# Patient Record
Sex: Female | Born: 1965 | Race: White | Hispanic: No | Smoking: Never smoker
Health system: Southern US, Community
[De-identification: ages and names within clinical notes are randomized; demographics above are authoritative.]

## PROBLEM LIST (undated history)

## (undated) DIAGNOSIS — R51 Headache: Secondary | ICD-10-CM

## (undated) DIAGNOSIS — E7800 Pure hypercholesterolemia, unspecified: Secondary | ICD-10-CM

## (undated) DIAGNOSIS — K219 Gastro-esophageal reflux disease without esophagitis: Secondary | ICD-10-CM

## (undated) DIAGNOSIS — E663 Overweight: Secondary | ICD-10-CM

## (undated) DIAGNOSIS — M5481 Occipital neuralgia: Secondary | ICD-10-CM

## (undated) DIAGNOSIS — F419 Anxiety disorder, unspecified: Secondary | ICD-10-CM

## (undated) DIAGNOSIS — K449 Diaphragmatic hernia without obstruction or gangrene: Secondary | ICD-10-CM

## (undated) DIAGNOSIS — T8859XA Other complications of anesthesia, initial encounter: Secondary | ICD-10-CM

## (undated) DIAGNOSIS — C801 Malignant (primary) neoplasm, unspecified: Secondary | ICD-10-CM

## (undated) DIAGNOSIS — R42 Dizziness and giddiness: Secondary | ICD-10-CM

## (undated) DIAGNOSIS — T4145XA Adverse effect of unspecified anesthetic, initial encounter: Secondary | ICD-10-CM

## (undated) DIAGNOSIS — M792 Neuralgia and neuritis, unspecified: Secondary | ICD-10-CM

## (undated) DIAGNOSIS — N200 Calculus of kidney: Secondary | ICD-10-CM

## (undated) HISTORY — DX: Anxiety disorder, unspecified: F41.9

## (undated) HISTORY — DX: Malignant (primary) neoplasm, unspecified: C80.1

## (undated) HISTORY — DX: Dizziness and giddiness: R42

## (undated) HISTORY — DX: Pure hypercholesterolemia, unspecified: E78.00

## (undated) HISTORY — DX: Diaphragmatic hernia without obstruction or gangrene: K44.9

## (undated) HISTORY — DX: Gastro-esophageal reflux disease without esophagitis: K21.9

## (undated) HISTORY — DX: Occipital neuralgia: M54.81

## (undated) HISTORY — DX: Overweight: E66.3

---

## 1995-08-01 HISTORY — PX: TUBAL LIGATION: SHX77

## 1998-09-16 ENCOUNTER — Ambulatory Visit (HOSPITAL_COMMUNITY): Admission: RE | Admit: 1998-09-16 | Discharge: 1998-09-16 | Payer: Self-pay | Admitting: *Deleted

## 2001-11-12 ENCOUNTER — Encounter: Payer: Self-pay | Admitting: *Deleted

## 2001-11-12 ENCOUNTER — Inpatient Hospital Stay (HOSPITAL_COMMUNITY): Admission: AD | Admit: 2001-11-12 | Discharge: 2001-11-12 | Payer: Self-pay | Admitting: *Deleted

## 2001-11-15 ENCOUNTER — Observation Stay (HOSPITAL_COMMUNITY): Admission: EM | Admit: 2001-11-15 | Discharge: 2001-11-15 | Payer: Self-pay | Admitting: Emergency Medicine

## 2003-11-20 ENCOUNTER — Ambulatory Visit (HOSPITAL_COMMUNITY): Admission: RE | Admit: 2003-11-20 | Discharge: 2003-11-20 | Payer: Self-pay | Admitting: Obstetrics & Gynecology

## 2004-09-08 ENCOUNTER — Ambulatory Visit: Payer: Self-pay | Admitting: Pulmonary Disease

## 2004-12-07 ENCOUNTER — Ambulatory Visit: Payer: Self-pay | Admitting: Pulmonary Disease

## 2005-03-21 ENCOUNTER — Ambulatory Visit: Payer: Self-pay | Admitting: Pulmonary Disease

## 2005-09-01 ENCOUNTER — Other Ambulatory Visit: Admission: RE | Admit: 2005-09-01 | Discharge: 2005-09-01 | Payer: Self-pay | Admitting: Obstetrics and Gynecology

## 2006-06-26 ENCOUNTER — Ambulatory Visit: Payer: Self-pay | Admitting: Pulmonary Disease

## 2006-09-10 ENCOUNTER — Ambulatory Visit: Payer: Self-pay | Admitting: Pulmonary Disease

## 2006-09-10 LAB — CONVERTED CEMR LAB
ALT: 19 units/L (ref 0–40)
AST: 18 units/L (ref 0–37)
Albumin: 3.5 g/dL (ref 3.5–5.2)
Alkaline Phosphatase: 67 units/L (ref 39–117)
BUN: 11 mg/dL (ref 6–23)
Bilirubin, Direct: 0.1 mg/dL (ref 0.0–0.3)
CO2: 28 meq/L (ref 19–32)
Calcium: 9.1 mg/dL (ref 8.4–10.5)
Chloride: 103 meq/L (ref 96–112)
Cholesterol: 174 mg/dL (ref 0–200)
Creatinine, Ser: 0.7 mg/dL (ref 0.4–1.2)
GFR calc Af Amer: 120 mL/min
GFR calc non Af Amer: 100 mL/min
Glucose, Bld: 86 mg/dL (ref 70–99)
HDL: 34.9 mg/dL — ABNORMAL LOW (ref >39.0)
LDL Cholesterol: 111 mg/dL — ABNORMAL HIGH (ref 0–99)
Potassium: 4.4 meq/L (ref 3.5–5.1)
Sodium: 140 meq/L (ref 135–145)
Total Bilirubin: 0.6 mg/dL (ref 0.3–1.2)
Total CHOL/HDL Ratio: 5
Total Protein: 6.5 g/dL (ref 6.0–8.3)
Triglycerides: 139 mg/dL (ref 0–149)
VLDL: 28 mg/dL (ref 0–40)

## 2007-03-12 ENCOUNTER — Ambulatory Visit: Payer: Self-pay | Admitting: Pulmonary Disease

## 2007-03-12 LAB — CONVERTED CEMR LAB
ALT: 29 units/L (ref 0–35)
AST: 23 units/L (ref 0–37)
Albumin: 3.6 g/dL (ref 3.5–5.2)
Alkaline Phosphatase: 68 units/L (ref 39–117)
BUN: 8 mg/dL (ref 6–23)
Basophils Absolute: 0 10*3/uL (ref 0.0–0.1)
Basophils Relative: 0.1 % (ref 0.0–1.0)
Bilirubin, Direct: 0.1 mg/dL (ref 0.0–0.3)
CO2: 31 meq/L (ref 19–32)
Calcium: 9.2 mg/dL (ref 8.4–10.5)
Chloride: 103 meq/L (ref 96–112)
Creatinine, Ser: 1 mg/dL (ref 0.4–1.2)
Eosinophils Absolute: 0.1 10*3/uL (ref 0.0–0.6)
Eosinophils Relative: 0.7 % (ref 0.0–5.0)
GFR calc Af Amer: 79 mL/min
GFR calc non Af Amer: 66 mL/min
Glucose, Bld: 117 mg/dL — ABNORMAL HIGH (ref 70–99)
HCT: 41.5 % (ref 36.0–46.0)
Hemoglobin: 14.2 g/dL (ref 12.0–15.0)
Lymphocytes Relative: 25.5 % (ref 12.0–46.0)
MCHC: 34.2 g/dL (ref 30.0–36.0)
MCV: 90.1 fL (ref 78.0–100.0)
Monocytes Absolute: 0.6 10*3/uL (ref 0.2–0.7)
Monocytes Relative: 5 % (ref 3.0–11.0)
Neutro Abs: 7.6 10*3/uL (ref 1.4–7.7)
Neutrophils Relative %: 68.7 % (ref 43.0–77.0)
Platelets: 250 10*3/uL (ref 150–400)
Potassium: 3.6 meq/L (ref 3.5–5.1)
RBC: 4.61 M/uL (ref 3.87–5.11)
RDW: 12 % (ref 11.5–14.6)
Sodium: 141 meq/L (ref 135–145)
TSH: 1.04 microintl units/mL (ref 0.35–5.50)
Total Bilirubin: 0.6 mg/dL (ref 0.3–1.2)
Total Protein: 6.8 g/dL (ref 6.0–8.3)
WBC: 11.2 10*3/uL — ABNORMAL HIGH (ref 4.5–10.5)

## 2007-03-14 ENCOUNTER — Ambulatory Visit (HOSPITAL_COMMUNITY): Admission: RE | Admit: 2007-03-14 | Discharge: 2007-03-14 | Payer: Self-pay | Admitting: Pulmonary Disease

## 2007-04-03 ENCOUNTER — Ambulatory Visit: Payer: Self-pay | Admitting: Pulmonary Disease

## 2007-04-03 DIAGNOSIS — I10 Essential (primary) hypertension: Secondary | ICD-10-CM | POA: Insufficient documentation

## 2007-04-03 DIAGNOSIS — K219 Gastro-esophageal reflux disease without esophagitis: Secondary | ICD-10-CM | POA: Insufficient documentation

## 2007-04-03 DIAGNOSIS — E7800 Pure hypercholesterolemia, unspecified: Secondary | ICD-10-CM | POA: Insufficient documentation

## 2007-04-03 DIAGNOSIS — I872 Venous insufficiency (chronic) (peripheral): Secondary | ICD-10-CM | POA: Insufficient documentation

## 2007-04-03 DIAGNOSIS — K449 Diaphragmatic hernia without obstruction or gangrene: Secondary | ICD-10-CM | POA: Insufficient documentation

## 2007-04-03 DIAGNOSIS — E663 Overweight: Secondary | ICD-10-CM | POA: Insufficient documentation

## 2007-11-29 ENCOUNTER — Ambulatory Visit: Payer: Self-pay | Admitting: Pulmonary Disease

## 2007-12-14 DIAGNOSIS — F411 Generalized anxiety disorder: Secondary | ICD-10-CM | POA: Insufficient documentation

## 2009-01-06 ENCOUNTER — Ambulatory Visit: Payer: Self-pay | Admitting: Internal Medicine

## 2009-01-07 LAB — CONVERTED CEMR LAB
ALT: 15 units/L (ref 0–35)
AST: 15 units/L (ref 0–37)
Albumin: 3.5 g/dL (ref 3.5–5.2)
Alkaline Phosphatase: 52 units/L (ref 39–117)
BUN: 15 mg/dL (ref 6–23)
Basophils Absolute: 0.1 10*3/uL (ref 0.0–0.1)
Basophils Relative: 1 % (ref 0.0–3.0)
Bilirubin, Direct: 0.1 mg/dL (ref 0.0–0.3)
CO2: 29 meq/L (ref 19–32)
Calcium: 8.8 mg/dL (ref 8.4–10.5)
Chloride: 112 meq/L (ref 96–112)
Cholesterol: 144 mg/dL (ref 0–200)
Creatinine, Ser: 0.7 mg/dL (ref 0.4–1.2)
Eosinophils Absolute: 0.1 10*3/uL (ref 0.0–0.7)
Eosinophils Relative: 1.3 % (ref 0.0–5.0)
GFR calc non Af Amer: 98.05 mL/min (ref >60)
Glucose, Bld: 90 mg/dL (ref 70–99)
HCT: 39 % (ref 36.0–46.0)
HDL: 46.2 mg/dL (ref >39.00)
Hemoglobin: 13.2 g/dL (ref 12.0–15.0)
LDL Cholesterol: 88 mg/dL (ref 0–99)
Lymphocytes Relative: 31.3 % (ref 12.0–46.0)
Lymphs Abs: 2.1 10*3/uL (ref 0.7–4.0)
MCHC: 33.8 g/dL (ref 30.0–36.0)
MCV: 91.8 fL (ref 78.0–100.0)
Monocytes Absolute: 0.4 10*3/uL (ref 0.1–1.0)
Monocytes Relative: 5.2 % (ref 3.0–12.0)
Neutro Abs: 4.1 10*3/uL (ref 1.4–7.7)
Neutrophils Relative %: 61.2 % (ref 43.0–77.0)
Platelets: 196 10*3/uL (ref 150.0–400.0)
Potassium: 4 meq/L (ref 3.5–5.1)
RBC: 4.25 M/uL (ref 3.87–5.11)
RDW: 11.8 % (ref 11.5–14.6)
Sodium: 142 meq/L (ref 135–145)
TSH: 0.83 microintl units/mL (ref 0.35–5.50)
Total Bilirubin: 0.5 mg/dL (ref 0.3–1.2)
Total CHOL/HDL Ratio: 3
Total Protein: 6.6 g/dL (ref 6.0–8.3)
Triglycerides: 49 mg/dL (ref 0.0–149.0)
VLDL: 9.8 mg/dL (ref 0.0–40.0)
WBC: 6.8 10*3/uL (ref 4.5–10.5)

## 2009-04-29 ENCOUNTER — Telehealth (INDEPENDENT_AMBULATORY_CARE_PROVIDER_SITE_OTHER): Payer: Self-pay | Admitting: *Deleted

## 2009-08-05 ENCOUNTER — Ambulatory Visit: Payer: Self-pay | Admitting: Pulmonary Disease

## 2009-08-08 LAB — CONVERTED CEMR LAB
ALT: 16 units/L (ref 0–35)
AST: 16 units/L (ref 0–37)
Albumin: 3.7 g/dL (ref 3.5–5.2)
Alkaline Phosphatase: 59 units/L (ref 39–117)
BUN: 9 mg/dL (ref 6–23)
Basophils Absolute: 0.1 10*3/uL (ref 0.0–0.1)
Basophils Relative: 0.8 % (ref 0.0–3.0)
Bilirubin Urine: NEGATIVE
Bilirubin, Direct: 0.1 mg/dL (ref 0.0–0.3)
CO2: 28 meq/L (ref 19–32)
Calcium: 9.1 mg/dL (ref 8.4–10.5)
Chloride: 105 meq/L (ref 96–112)
Cholesterol: 167 mg/dL (ref 0–200)
Creatinine, Ser: 0.8 mg/dL (ref 0.4–1.2)
Eosinophils Absolute: 0.1 10*3/uL (ref 0.0–0.7)
Eosinophils Relative: 0.9 % (ref 0.0–5.0)
GFR calc non Af Amer: 83.81 mL/min (ref >60)
Glucose, Bld: 89 mg/dL (ref 70–99)
HCT: 42.1 % (ref 36.0–46.0)
HDL: 46.1 mg/dL (ref >39.00)
Hemoglobin, Urine: NEGATIVE
Hemoglobin: 13.9 g/dL (ref 12.0–15.0)
Ketones, ur: NEGATIVE mg/dL
LDL Cholesterol: 106 mg/dL — ABNORMAL HIGH (ref 0–99)
Lymphocytes Relative: 26.4 % (ref 12.0–46.0)
Lymphs Abs: 2.5 10*3/uL (ref 0.7–4.0)
MCHC: 33 g/dL (ref 30.0–36.0)
MCV: 92.5 fL (ref 78.0–100.0)
Monocytes Absolute: 0.4 10*3/uL (ref 0.1–1.0)
Monocytes Relative: 4.6 % (ref 3.0–12.0)
Neutro Abs: 6.2 10*3/uL (ref 1.4–7.7)
Neutrophils Relative %: 67.3 % (ref 43.0–77.0)
Nitrite: NEGATIVE
Platelets: 173 10*3/uL (ref 150.0–400.0)
Potassium: 4.1 meq/L (ref 3.5–5.1)
RBC: 4.55 M/uL (ref 3.87–5.11)
RDW: 12.2 % (ref 11.5–14.6)
Sodium: 139 meq/L (ref 135–145)
Specific Gravity, Urine: 1.01 (ref 1.000–1.030)
TSH: 1.4 microintl units/mL (ref 0.35–5.50)
Total Bilirubin: 0.7 mg/dL (ref 0.3–1.2)
Total CHOL/HDL Ratio: 4
Total Protein, Urine: NEGATIVE mg/dL
Total Protein: 7.4 g/dL (ref 6.0–8.3)
Triglycerides: 74 mg/dL (ref 0.0–149.0)
Urine Glucose: NEGATIVE mg/dL
Urobilinogen, UA: 0.2 (ref 0.0–1.0)
VLDL: 14.8 mg/dL (ref 0.0–40.0)
WBC: 9.3 10*3/uL (ref 4.5–10.5)
pH: 6.5 (ref 5.0–8.0)

## 2010-02-08 ENCOUNTER — Ambulatory Visit: Payer: Self-pay | Admitting: Pulmonary Disease

## 2010-04-28 ENCOUNTER — Ambulatory Visit: Payer: Self-pay | Admitting: Pulmonary Disease

## 2010-05-20 ENCOUNTER — Telehealth: Payer: Self-pay | Admitting: Pulmonary Disease

## 2010-06-15 ENCOUNTER — Encounter
Admission: RE | Admit: 2010-06-15 | Discharge: 2010-06-15 | Payer: Self-pay | Source: Home / Self Care | Attending: Pulmonary Disease | Admitting: Pulmonary Disease

## 2010-06-15 ENCOUNTER — Encounter: Payer: Self-pay | Admitting: Pulmonary Disease

## 2010-06-16 ENCOUNTER — Telehealth: Payer: Self-pay | Admitting: Pulmonary Disease

## 2010-07-08 ENCOUNTER — Encounter: Payer: Self-pay | Admitting: Pulmonary Disease

## 2010-07-14 ENCOUNTER — Encounter: Payer: Self-pay | Admitting: Pulmonary Disease

## 2010-08-10 ENCOUNTER — Telehealth: Payer: Self-pay | Admitting: Pulmonary Disease

## 2010-08-26 ENCOUNTER — Ambulatory Visit
Admission: RE | Admit: 2010-08-26 | Discharge: 2010-08-26 | Payer: Self-pay | Source: Home / Self Care | Attending: Pulmonary Disease | Admitting: Pulmonary Disease

## 2010-08-26 ENCOUNTER — Other Ambulatory Visit: Payer: Self-pay | Admitting: Pulmonary Disease

## 2010-08-26 LAB — URINALYSIS, ROUTINE W REFLEX MICROSCOPIC
Bilirubin Urine: NEGATIVE
Ketones, ur: NEGATIVE
Leukocytes, UA: NEGATIVE
Nitrite: NEGATIVE
Specific Gravity, Urine: 1.02 (ref 1.000–1.030)
Total Protein, Urine: NEGATIVE
Urine Glucose: NEGATIVE
Urobilinogen, UA: 0.2 (ref 0.0–1.0)
pH: 7.5 (ref 5.0–8.0)

## 2010-08-26 LAB — HEPATIC FUNCTION PANEL
ALT: 15 U/L (ref 0–35)
AST: 14 U/L (ref 0–37)
Albumin: 3.4 g/dL — ABNORMAL LOW (ref 3.5–5.2)
Alkaline Phosphatase: 57 U/L (ref 39–117)
Bilirubin, Direct: 0 mg/dL (ref 0.0–0.3)
Total Bilirubin: 0.5 mg/dL (ref 0.3–1.2)
Total Protein: 6.3 g/dL (ref 6.0–8.3)

## 2010-08-26 LAB — TSH: TSH: 1.09 u[IU]/mL (ref 0.35–5.50)

## 2010-08-26 LAB — CBC WITH DIFFERENTIAL/PLATELET
Basophils Absolute: 0.1 10*3/uL (ref 0.0–0.1)
Basophils Relative: 1.6 % (ref 0.0–3.0)
Eosinophils Absolute: 0.1 10*3/uL (ref 0.0–0.7)
Eosinophils Relative: 1.1 % (ref 0.0–5.0)
HCT: 37.2 % (ref 36.0–46.0)
Hemoglobin: 12.5 g/dL (ref 12.0–15.0)
Lymphocytes Relative: 29.1 % (ref 12.0–46.0)
Lymphs Abs: 2.2 10*3/uL (ref 0.7–4.0)
MCHC: 33.6 g/dL (ref 30.0–36.0)
MCV: 90.3 fl (ref 78.0–100.0)
Monocytes Absolute: 0.5 10*3/uL (ref 0.1–1.0)
Monocytes Relative: 6.1 % (ref 3.0–12.0)
Neutro Abs: 4.7 10*3/uL (ref 1.4–7.7)
Neutrophils Relative %: 62.1 % (ref 43.0–77.0)
Platelets: 182 10*3/uL (ref 150.0–400.0)
RBC: 4.12 Mil/uL (ref 3.87–5.11)
RDW: 13.9 % (ref 11.5–14.6)
WBC: 7.6 10*3/uL (ref 4.5–10.5)

## 2010-08-26 LAB — BASIC METABOLIC PANEL
BUN: 16 mg/dL (ref 6–23)
CO2: 29 mEq/L (ref 19–32)
Calcium: 8.9 mg/dL (ref 8.4–10.5)
Chloride: 106 mEq/L (ref 96–112)
Creatinine, Ser: 0.7 mg/dL (ref 0.4–1.2)
GFR: 94.16 mL/min (ref >60.00)
Glucose, Bld: 100 mg/dL — ABNORMAL HIGH (ref 70–99)
Potassium: 4.6 mEq/L (ref 3.5–5.1)
Sodium: 139 mEq/L (ref 135–145)

## 2010-08-26 LAB — LIPID PANEL
Cholesterol: 173 mg/dL (ref 0–200)
HDL: 46 mg/dL (ref >39.00)
LDL Cholesterol: 108 mg/dL — ABNORMAL HIGH (ref 0–99)
Total CHOL/HDL Ratio: 4
Triglycerides: 96 mg/dL (ref 0.0–149.0)
VLDL: 19.2 mg/dL (ref 0.0–40.0)

## 2010-08-28 LAB — CONVERTED CEMR LAB
ALT: 15 units/L (ref 0–35)
AST: 15 units/L (ref 0–37)
Albumin: 3.8 g/dL (ref 3.5–5.2)
Alkaline Phosphatase: 56 units/L (ref 39–117)
BUN: 11 mg/dL (ref 6–23)
Basophils Absolute: 0.1 10*3/uL (ref 0.0–0.1)
Basophils Relative: 0.8 % (ref 0.0–1.0)
Bilirubin Urine: NEGATIVE
Bilirubin, Direct: 0.1 mg/dL (ref 0.0–0.3)
CO2: 29 meq/L (ref 19–32)
Calcium: 9 mg/dL (ref 8.4–10.5)
Chloride: 104 meq/L (ref 96–112)
Cholesterol: 156 mg/dL (ref 0–200)
Creatinine, Ser: 0.7 mg/dL (ref 0.4–1.2)
Crystals: NEGATIVE
Eosinophils Absolute: 0.1 10*3/uL (ref 0.0–0.7)
Eosinophils Relative: 0.8 % (ref 0.0–5.0)
GFR calc Af Amer: 120 mL/min
GFR calc non Af Amer: 99 mL/min
Glucose, Bld: 96 mg/dL (ref 70–99)
HCT: 42.4 % (ref 36.0–46.0)
HDL: 37.2 mg/dL — ABNORMAL LOW (ref >39.0)
Hemoglobin: 14.4 g/dL (ref 12.0–15.0)
Ketones, ur: NEGATIVE mg/dL
LDL Cholesterol: 107 mg/dL — ABNORMAL HIGH (ref 0–99)
Lymphocytes Relative: 27.3 % (ref 12.0–46.0)
MCHC: 33.9 g/dL (ref 30.0–36.0)
MCV: 91.2 fL (ref 78.0–100.0)
Monocytes Absolute: 0.4 10*3/uL (ref 0.1–1.0)
Monocytes Relative: 5.5 % (ref 3.0–12.0)
Mucus, UA: NEGATIVE
Neutro Abs: 5.1 10*3/uL (ref 1.4–7.7)
Neutrophils Relative %: 65.6 % (ref 43.0–77.0)
Nitrite: NEGATIVE
Platelets: 199 10*3/uL (ref 150–400)
Potassium: 3.9 meq/L (ref 3.5–5.1)
RBC: 4.65 M/uL (ref 3.87–5.11)
RDW: 12.2 % (ref 11.5–14.6)
Sodium: 138 meq/L (ref 135–145)
Specific Gravity, Urine: 1.015 (ref 1.000–1.03)
TSH: 1.43 microintl units/mL (ref 0.35–5.50)
Total Bilirubin: 0.8 mg/dL (ref 0.3–1.2)
Total CHOL/HDL Ratio: 4.2
Total Protein, Urine: NEGATIVE mg/dL
Total Protein: 7 g/dL (ref 6.0–8.3)
Triglycerides: 59 mg/dL (ref 0–149)
Urine Glucose: NEGATIVE mg/dL
Urobilinogen, UA: 0.2 (ref 0.0–1.0)
VLDL: 12 mg/dL (ref 0–40)
WBC: 7.8 10*3/uL (ref 4.5–10.5)
pH: 6.5 (ref 5.0–8.0)

## 2010-08-30 ENCOUNTER — Ambulatory Visit
Admission: RE | Admit: 2010-08-30 | Discharge: 2010-08-30 | Payer: Self-pay | Source: Home / Self Care | Attending: Pulmonary Disease | Admitting: Pulmonary Disease

## 2010-08-30 ENCOUNTER — Encounter: Payer: Self-pay | Admitting: Pulmonary Disease

## 2010-08-31 ENCOUNTER — Encounter: Payer: Self-pay | Admitting: Pulmonary Disease

## 2010-08-31 ENCOUNTER — Other Ambulatory Visit (HOSPITAL_COMMUNITY): Payer: Self-pay | Admitting: Surgery

## 2010-09-01 NOTE — Assessment & Plan Note (Signed)
Summary: Acute NP office visit left arm numbness   Visit Type:  NP acute visit Primary Provider/Referring Provider:  Alroy Dust, MD  CC:  Pt c/o left hand numbness radiating to elbow x 2 weeks.  History of Present Illness:  45   y/o WF here for a with known history of GERD, Anxiety and obesity.   11/29/07--follow up visit and CPX... she has mult med problems as noted below... her CC today is insomnia- tragically her 62 y/o son died in his sleep 2023-04-12 and autopsy showed no apparent cause, he was healthy without medical problems... she has lost 35 lbs w/ the stress... she is getting counselling thru hospice... she has a 65 y/o son at home.  January 06, 2009--Presents for acute visit....knot on back of R thigh.... Noticed a sore spot along thigh for last 2 weeks. Now area is red, tender and getting bigger. Denies chest pain, dyspnea, orthopnea, hemoptysis, fever, n/v/d, edema, headache, fever. Pt has not been taking reglan or restoril.     ~  August 05, 2009:  she has regained her weight, and now on Weight Watcher's diet plan at work... feeling well without new complaints or concerns... she had the 12/13/08 Flu shot in Oct... needs 90d supply refills.  February 08, 2010--Presents for an acute office visit. Pt complains of  left hand numbness radiating to elbow x 2 weeks. Noticed after lifting luggage over her head on airplane that she was having prickling sensation along medial forearm from elbow to fingertips, tips of fingers feels like they are asleep intermittently. No known injury or previous episodes. does not play sports or excessive exericse or lifting. Neck intially felt tight  but none since 2 weeks ago, no neck pain now. denies ext weakness , slurred speech , visual changes. Has not taken any meds for tx.       Medications Prior to Update: 1)  Lisinopril-Hydrochlorothiazide 20-12.5 Mg  Tabs (Lisinopril-Hydrochlorothiazide) .... Take As Directed... 2)  Simvastatin 40 Mg  Tabs (Simvastatin) .... Take  1 Tab By Mouth At Bedtime.Marland KitchenMarland Kitchen 3)  Protonix 40 Mg  Tbec (Pantoprazole Sodium) .... Take 1 Tab By Mouth Once Daily- 30 Min Before [employer]... 4)  Alprazolam 0.5 Mg  Tabs (Alprazolam) .... Take 1/2 To 1 Tab Three Times A Day As Needed For Nerves...  Current Medications (verified): 1)  Lisinopril-Hydrochlorothiazide 20-12.5 Mg  Tabs (Lisinopril-Hydrochlorothiazide) .... Take As Directed... 2)  Simvastatin 40 Mg  Tabs (Simvastatin) .... Take 1 Tab By Mouth At Bedtime.Marland KitchenMarland Kitchen 3)  Protonix 40 Mg  Tbec (Pantoprazole Sodium) .... Take 1 Tab By Mouth Once Daily- 30 Min Before [employer]... 4)  Alprazolam 0.5 Mg  Tabs (Alprazolam) .... Take 1/2 To 1 Tab Three Times A Day As Needed For Nerves...  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  Family History: Last updated: 08/05/2009 Father alive age 88 w/ hx MI & CABG, smoker... Mother, Elwyn Reach, alive age 33 w/ hx breast ca, HBP, DM, Chol. 1 Sibling- Brother, J.Norman Clay, alive age 32 w/ HBP, Chol.  Social History: Last updated: 02/08/2010 Married, husband= [place], 18 yrs... 2 Sons- one died suddenly Dec 14, 2006 ?cause. 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 2 Sons- one died suddenly 17-Dec-2006 ?cause. 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 [employer]  Review of Systems      See HPI  Vital Signs:  Patient profile:   45 year old female Height:      61 inches Weight:      217 pounds BMI:      41.15 O2 Sat:      98 % on Room air Temp:     98.6 degrees F oral Pulse rate:   98 / minute BP sitting:   116 / 78  (left arm) Cuff size:   large  Vitals Entered By: Zackery Barefoot CMA (February 08, 2010 3:09 PM)  O2 Flow:  Room air CC: Pt c/o left hand numbness radiating to elbow x 2 weeks Comments Medications reviewed with patient Verified contact number and pharmacy with patient Zackery Barefoot CMA  February 08, 2010 3:10 PM    Physical Exam  Additional Exam:  WD, WN, 45 y/o WF in NAD...  GENERAL:  Alert & oriented; pleasant & cooperative. HEENT:  [place]/AT,   THROAT-clear & wnl. NECK:  Supple w/ full ROM; no JVD; normal carotid impulses w/o bruits; no thyromegaly or nodules palpated; no lymphadenopathy. CHEST:  Clear to P & A; without wheezes/ rales/ or rhonchi. HEART:  Regular Rhythm; without murmurs/ rubs/ or gallops. ABDOMEN:  Soft & nontender; normal bowel sounds; no organomegaly or masses detected. EXT: without deformities or arthritic changes; no varicose veins/ venous insuffic/ or edema. Musculoskeletal: elbow nontender , ROM nml , cervical rom nml , no radicular symptoms, nml grips, alternating finger movemnts nml ,  equal strength of upper ext nml, nml gait. , CN 2-12 intact w /no focal deficits noted., tinels/phalens sign neg.   DERM: intact w/ no rash/lesions noted.      Impression & Recommendations:  Problem # 1:  PARESTHESIA (ICD-782.0)  Left hand/forearm paresthesia ? etiology , exam is unrevealing. musculoskeletal and neuro exam neg.  will treat for possible joint inflammation/irritation w/ nsaids , rest, elevation  if not improving will need further workup.   Orders: Est. Patient Level III (95638)  Complete Medication List: 1)  Lisinopril-hydrochlorothiazide 20-12.5 Mg Tabs (Lisinopril-hydrochlorothiazide) .... Take as directed... 2)  Simvastatin 40 Mg Tabs (Simvastatin) .... Take 1 tab by mouth at bedtime.Marland KitchenMarland Kitchen 3)  Protonix 40 Mg Tbec (Pantoprazole sodium) ....  Take 1 tab by mouth once daily- 30 min before dinner... 4)  Alprazolam 0.5 Mg Tabs (Alprazolam) .... Take 1/2 to 1 tab three times a day as needed for nerves...  Patient Instructions: 1)  Motrin 200mg  3 tabs two times a day for 7 days w/ food  2)  Alternate ice and heat to elbow two times a day  3)  Elevate arm , avoid propping elbow on hard surface.  4)  Please contact office for sooner follow up if symptoms do not improve or worsen  Prescriptions: ALPRAZOLAM 0.5 MG  TABS (ALPRAZOLAM) take 1/2 to 1 tab three times a day as needed for nerves...  #100 x 0   Entered and Authorized by:   Rubye Oaks NP   Signed by:   Jayren Cease NP on 02/08/2010   Method used:   Print then Give to Patient   RxID:   [id number]    Immunization History:  Influenza Immunization History:    Influenza:  historical (05/03/2009)

## 2010-09-01 NOTE — Letter (Signed)
Summary: [facility]  [address]   [place], [place] [address]   Phone: [phone]  Fax: [phone]    07/14/2010    Katelyn Bailey [address] [place], [place]  [address]   RE:  Katelyn Bailey DOB: [birth year]    To Whom It May Concern,   Katelyn Bailey has been seen by me in my practice for 8 years.  She suffers from GERD, HBP and hypercholestremia.  Her current weight is 222, height is 61 inches and BMI is 42.10.  [name] has tried without success diet, exercise, weight watchers.   I feel that [name] would benefit from weight loss surgery because she has been unsuccessful losing weight with other diet methods and her medical condition will become threatening to her health if she is unable to control her weight.   I appreciate your consideration in this matter.  Please contact my office for further questions.     Sincerely,       Katelyn Cloud. Elayne Snare. D.

## 2010-09-01 NOTE — Assessment & Plan Note (Signed)
Summary: cpx/lmr   CC:  20 month ROV & review of mult medical problems....  History of Present Illness: 45 y/o WF here for a follow up visit and CPX... she has mult med problems as noted below...    ~  May09:  her CC today is insomnia- tragically her 21 y/o son died in his sleep 2023/05/04 and autopsy showed no apparent cause, he was healthy without medical problems... she has lost 35 lbs w/ the stress... she is getting counselling thru hospice... she has a 27 y/o son at home.   ~  August 05, 2009:  she has regained her weight, and now on Weight Watcher's diet plan at work... feeling well without new complaints or concerns... she had the January 04, 2009 Flu shot in Oct... needs 90d supply refills.   Current Problem List:  ESSENTIAL HYPERTENSION (ICD-401.9) - controlled on LISINOPRIL/Hct 20-12.5 daily... BP= 102/70 today & similar at home, & we discussed decr this med to 1/2 tab daily... denies HA, visual changes, CP, palipit, dizziness, syncope, dyspnea, edema, etc... she will continue to monitor BP at home.  VENOUS INSUFFICIENCY, LEGS (ICD-459.81) - follows a low sodium diet, no recent edema...  HYPERCHOLESTEROLEMIA (ICD-272.0) - on SIMVASTATIN 40mg /d...  ~  labs 2/06 showed  TChol 227, HDL 42, LDL 182 & start Simva 40;   ~  f/u 2/08 TChol 174, HDL 35, LDL 111...  ~  FLP 5/09 showed TChol 156, TG 59, HDL 37, LDL 107  ~  FLP 6/10 showed TChol 144, TG 49, HDL 46, LDL 88  ~  FLP 1/11 showed TChol   OVERWEIGHT (ICD-278.02) - 200 in 05-Jan-2000; 207 in '02; 210 in '05; 219 in '07; & 211 8/08...  ~  weight down to 175# now as above...  ~  weight 1/11 = 205#... on weight watchers now.  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  ACID REFLUX DISEASE (ICD-530.81) - 8/08 eval w/ LER, improved w/ PROTONIX 40/d...  DIZZINESS (ICD-780.4) - uses mecliz as needed.  ANXIETY (ICD-300.00) - on ALPRAZOLAM 0.5mg  Qhs Prn...    Allergies (verified): No Known Drug  Allergies  Comments:  Nurse/Medical Assistant: The patient's medications and allergies were reviewed with the patient and were updated in the Medication and Allergy Lists.  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  Family History: Father alive age 25 w/ hx MI & CABG, smoker... Mother, Elwyn Reach, alive age 71 w/ hx breast ca, HBP, DM, Chol. 1 Sibling- Brother, J.Norman Clay, alive age 30 w/ HBP, Chol.  Social History: Married, husband= [place], 18 yrs... 2 Sons- one died suddenly 2007-01-05 ?cause. 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 [employer]  Review of Systems  The patient denies fever, chills, sweats, anorexia, fatigue, weakness, malaise, weight loss, sleep disorder, blurring, diplopia, eye irritation, eye discharge, vision loss, eye pain, photophobia, earache, ear discharge, tinnitus, decreased hearing, nasal congestion, nosebleeds, sore throat, hoarseness, chest pain, palpitations, syncope, dyspnea on exertion, orthopnea, PND, peripheral edema, cough, dyspnea at rest, excessive sputum, hemoptysis, wheezing, pleurisy, nausea, vomiting, diarrhea, constipation, change in bowel habits, abdominal pain, melena, hematochezia, jaundice, gas/bloating, indigestion/heartburn, dysphagia, odynophagia, dysuria, hematuria, urinary frequency, urinary hesitancy, nocturia, incontinence, back pain, joint pain, joint swelling, muscle cramps, muscle weakness, stiffness, arthritis, sciatica, restless legs, leg pain at  night, leg pain with exertion, rash, itching, dryness, suspicious lesions, paralysis, paresthesias, seizures, tremors, vertigo, transient blindness, frequent falls, frequent  headaches, difficulty walking, depression, anxiety, memory loss, confusion, cold intolerance, heat intolerance, polydipsia, polyphagia, polyuria, unusual weight change, abnormal bruising, bleeding, enlarged lymph nodes, urticaria, allergic rash, hay fever, and recurrent infections.    Vital Signs:  Patient profile:   45 year old female Height:      61 inches Weight:      205.13 pounds BMI:     38.90 O2 Sat:      97 % on Room air Temp:     97.6 degrees F oral Pulse rate:   88 / minute BP sitting:   102 / 70  (left arm) Cuff size:   large  Vitals Entered By: Randell Loop CMA (August 05, 2009 11:39 AM)  O2 Sat at Rest %:  97 O2 Flow:  Room air CC: 20 month ROV & review of mult medical problems... Is Patient Diabetic? No Pain Assessment Patient in pain? no      Comments meds updated today   Physical Exam  Additional Exam:  WD, sl overweight, 45 y/o WF in NAD...  GENERAL:  Alert & oriented; pleasant & cooperative. HEENT:  [place]/AT, EOM-full, PERRLA,  EACs-clear, TMs-wnl, NOSE-clear, THROAT-clear & wnl. NECK:  Supple w/ full ROM; no JVD; normal carotid impulses w/o bruits; no thyromegaly or nodules palpated; no lymphadenopathy. CHEST:  Clear to P & A; without wheezes/ rales/ or rhonchi. HEART:  Regular Rhythm; without murmurs/ rubs/ or gallops. ABDOMEN:  Soft & nontender; normal bowel sounds; no organomegaly or masses detected. EXT: without deformities or arthritic changes; no varicose veins/ venous insuffic/ or edema.  DERM:  no lesions seen, no rashes, etc...     EKG  Procedure date:  08/05/2009  Findings:      Normal sinus rhythm with rate of:  70/min... Tracings show NSSTTWA...   SN   MISC. Report  Procedure date:  08/05/2009  Findings:        Cholesterol               167 mg/dL                   1-610   Triglycerides             74.0 mg/dL                  9.6-045.4   HDL                       09.81 mg/dL                 >19.14   VLDL Cholesterol          14.8  mg/dL                  7.8-29.5   LDL Cholesterol      [H]  621 mg/dL                   3-08     Sodium                    139 mEq/L                   135-145   Potassium                 4.1 mEq/L  3.5-5.1   Chloride                  105 mEq/L                   96-112   Carbon Dioxide            28 mEq/L                    19-32   Glucose                   89 mg/dL                    16-10   BUN                       9 mg/dL                     9-60   Creatinine                0.8 mg/dL                   4.5-4.0   Calcium                   9.1 mg/dL                   9.8-11.9   GFR                       83.81 mL/min                >60     White Cell Count          9.3 K/uL                    4.5-10.5   Red Cell Count            4.55 Mil/uL                 3.87-5.11   Hemoglobin                13.9 g/dL                   14.7-82.9   Hematocrit                42.1 %                      36.0-46.0   MCV                       92.5 fl                     78.0-100.0   Platelet Count            173.0 K/uL   Neutrophil %              67.3 %                      43.0-77.0   Lymphocyte %              26.4 %                      12.0-46.0   Monocyte %  4.6 %                       3.0-12.0   Eosinophils%              0.9 %                       0.0-5.0   Basophils %               0.8 %   Comments:        Total Bilirubin           0.7 mg/dL                   1.6-1.0   Direct Bilirubin          0.1 mg/dL                   9.6-0.4   Alkaline Phosphatase      59 U/L                      39-117   AST                       16 U/L                      0-37   ALT                       16 U/L                      0-35   Total Protein             7.4 g/dL                    5.4-0.9   Albumin                   3.7 g/dL                    8.1-1.9     FastTSH                   1.40 uIU/mL                 0.35-5.50     Color                     LT. YELLOW   Clarity                    CLEAR                       Clear   Specific Gravity          1.010                       1.000 - 1.030   Urine Ph                  6.5                         5.0-8.0   Protein                   NEGATIVE  Negative   Urine Glucose             NEGATIVE                    Negative   Ketones                   NEGATIVE                    Negative   Urine Bilirubin           NEGATIVE                    Negative   Blood                     NEGATIVE                    Negative   Urobilinogen              0.2                         0.0 - 1.0   Leukocyte Esterace        SMALL                       Negative   Nitrite                   NEGATIVE                    Negative   Urine WBC                 0-2/hpf                     0-2/hpf   Urine Epith               Rare(0-4/hpf)               Rare(0-4/hpf)   Urine Bacteria            Rare(<10/hpf)               None   Impression & Recommendations:  Problem # 1:  PHYSICAL EXAMINATION (ICD-V70.0)  Orders: EKG w/ Interpretation (93000) Venipuncture (19147) TLB-Lipid Panel (80061-LIPID) TLB-BMP (Basic Metabolic Panel-BMET) (80048-METABOL) TLB-CBC Platelet - w/Differential (85025-CBCD) TLB-Hepatic/Liver Function Pnl (80076-HEPATIC) TLB-TSH (Thyroid Stimulating Hormone) (84443-TSH) TLB-Udip w/ Micro (81001-URINE)  Problem # 2:  ESSENTIAL HYPERTENSION (ICD-401.9) BP sl low on the Lisinopril/ Hct one daily... we discussed decr to 1/2 tab daily & monitor BP at home-  call for questions... Her updated medication list for this problem includes:    Lisinopril-hydrochlorothiazide 20-12.5 Mg Tabs (Lisinopril-hydrochlorothiazide) .Marland Kitchen... Take as directed...  Problem # 3:  VENOUS INSUFFICIENCY, LEGS (ICD-459.81) Stable-  no salt, elevation, etc...  Problem # 4:  HYPERCHOLESTEROLEMIA (ICD-272.0) Stable on the Simva40... needs better diet, get weight down. Her updated medication list for this problem includes:    Simvastatin 40  Mg Tabs (Simvastatin) .Marland Kitchen... Take 1 tab by mouth at bedtime...  Problem # 5:  OVERWEIGHT (ICD-278.02) We discussed diet + exercise program; she asked about diet pills- discussed.  Problem # 6:  ACID REFLUX DISEASE (ICD-530.81) GI is stable on the Protonix... Her updated medication list for this problem includes:    Protonix 40 Mg Tbec (Pantoprazole sodium) .Marland Kitchen... Take 1 tab by  mouth once daily- 30 min before dinner...  Problem # 7:  ANXIETY (ICD-300.00) She uses the Alpraz at bedtime for sleep... OK. Her updated medication list for this problem includes:    Alprazolam 0.5 Mg Tabs (Alprazolam) .Marland Kitchen... Take 1/2 to 1 tab three times a day as needed for nerves...  Problem # 8:  OTHER MEDICAL PROBLEMS AS NOTED>>> She had 2010 Flu vaccine in Oct... she esees DrSilva for GYN- f/u due soon...  Complete Medication List: 1)  Lisinopril-hydrochlorothiazide 20-12.5 Mg Tabs (Lisinopril-hydrochlorothiazide) .... Take as directed... 2)  Simvastatin 40 Mg Tabs (Simvastatin) .... Take 1 tab by mouth at bedtime.Marland KitchenMarland Kitchen 3)  Protonix 40 Mg Tbec (Pantoprazole sodium) .... Take 1 tab by mouth once daily- 30 min before dinner... 4)  Alprazolam 0.5 Mg Tabs (Alprazolam) .... Take 1/2 to 1 tab three times a day as needed for nerves...  Other Orders: Prescription Created Electronically [phone])  Patient Instructions: 1)  Today we updated your med list- see below.... 2)  We refilled your meds for 2011... 3)  Today we did your follow up FASTING blood work... please call the "phone tree" in a few days for your lab results.Marland KitchenMarland Kitchen 4)  Keep up the good work w/ diet & exercise!!! 5)  Call for any problems.Marland KitchenMarland Kitchen 6)  Please schedule a follow-up appointment in 1 year, sooner as needed. Prescriptions: ALPRAZOLAM 0.5 MG  TABS (ALPRAZOLAM) take 1/2 to 1 tab three times a day as needed for nerves...  #100 x prn   Entered and Authorized by:   Michele Mcalpine MD   Signed by:   Michele Mcalpine MD on 08/05/2009   Method used:   Print then Give  to Patient   RxID:   [id number] PROTONIX 40 MG  TBEC (PANTOPRAZOLE SODIUM) take 1 tab by mouth once daily- 30 min before dinner...  #90 x prn   Entered and Authorized by:   Michele Mcalpine MD   Signed by:   Michele Mcalpine MD on 08/05/2009   Method used:   Print then Give to Patient   RxID:   [id number] SIMVASTATIN 40 MG  TABS (SIMVASTATIN) take 1 tab by mouth at bedtime...  #90 x prn   Entered and Authorized by:   Michele Mcalpine MD   Signed by:   Michele Mcalpine MD on 08/05/2009   Method used:   Print then Give to Patient   RxID:   [id number] LISINOPRIL-HYDROCHLOROTHIAZIDE 20-12.5 MG  TABS (LISINOPRIL-HYDROCHLOROTHIAZIDE) take as directed...  #90 x prn   Entered and Authorized by:   Michele Mcalpine MD   Signed by:   Michele Mcalpine MD on 08/05/2009   Method used:   Print then Give to Patient   RxID:   [id number]    [employer]io[employer] ECG  ID: [id number] Patient: Katelyn Bailey DOB: 1966/01/27 Age: 45 Years Old Sex: Female Race: White Physician: nadel,s Technician: Randell Loop CMA Height: 61 Weight: 205.13 Status: Unconfirmed Past Medical History:   ESSENTIAL HYPERTENSION (ICD-401.9) - controlled on LISINOPRIL/Hct 20-12.5 daily...    VENOUS INSUFFICIENCY, LEGS (ICD-459.81) - follows a low sodium diet, no recent edema...  HYPERCHOLESTEROLEMIA (ICD-272.0) - on SIMVASTATIN 40mg /d...  ~  2/06 labs showed  TChol 227, HDL 42, LDL 182 & start Simvi 40;   ~  f/u 2/08 TChol 174, HDL 35, LDL 111.  OVERWEIGHT (ICD-278.02) - 200 in 2001; 207 in '02; 210 in '05; 219 in '07; & 211 8/08...  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  ACID REFLUX DISEASE (ICD-530.81) - 8/08 eval w/ LER, improved w/ Protonix 40/d, &   DIZZINESS (ICD-780.4) - uses mecliz as needed.  ANXIETY (ICD-300.00) - Rebeca Allegra helps some...    [facility] Maintence Dr. Edward Jolly - GYN Recorded: 08/05/2009 12:21 AM P/PR: 115  ms / 155 ms - Heart rate (maximum exercise) QRS: 93 QT/QTc/QTd: 415 ms / 434 ms / 50 ms - Heart rate (maximum exercise)  P/QRS/T axis: 54 deg / 54 deg / -13 deg - Heart rate (maximum exercise)  Heartrate: 71 bpm  Interpretation:  Normal sinus rhythm with rate of:  70/min... Tracings show NSSTTWA...   SN

## 2010-09-01 NOTE — Progress Notes (Signed)
Summary: nutrition  Phone Note Call from Patient   Caller: Patient Call For: Shian Goodnow Summary of Call: pt requests to speak to nurse re: bariatric nutrition. [phone] Initial call taken by: Tivis Ringer, CNA,  August 10, 2010 12:02 PM  Follow-up for Phone Call        lmomtcb Randell Loop [facility]  August 10, 2010 2:13 PM   pt returned call.  she states she spoke with SN re: bariatric surgery and was referred to clinic for nutrition.  she states her insurance informed her that nutrition will have to be handled thru our office.  once a month x38months.  we have to do all the paperwork.  she has a form that has to be filled out at each ov.  pt okay with seeing TP.  would like to know if SN is okay with doing this.  has cpx w/ SN on 2.23.12 to discuss further.  pt okay with call back tomorrow.  okay to LM on cell: [phone]. Boone Master CNA/MA  August 10, 2010 4:44 PM   Additional Follow-up for Phone Call Additional follow up Details #1::        lmomtcb to discuss moving her cpx up to the end of jan and to talk about the nutrition forms that need to be filled out. Randell Loop [facility]  August 11, 2010 9:17 AM     Additional Follow-up for Phone Call Additional follow up Details #2::    pt returned my call and she will reschedule cpx to 1-31 and will come in on 1-27 for fasting labs.  pt is aware to bring in all forms for the nutrition /bariatric surgery.   Randell Loop CMA  August 12, 2010 9:43 AM

## 2010-09-01 NOTE — Assessment & Plan Note (Signed)
Summary: NUMBNESS IN HANDS SOME BETTER/CB   Primary Care Provider:  Alroy Dust, MD  CC:  8 month ROV & review....  History of Present Illness: 45 y/o WF here for a follow up visit... she has mult med problems as noted below...    ~  May09:  her CC today is insomnia- tragically her 52 y/o son died in his sleep 05-07-2023 and autopsy showed no apparent cause, he was healthy without medical problems... she has lost 35 lbs w/ the stress... she is getting counselling thru hospice... she has a 2 y/o son at home.   ~  August 05, 2009:  she has regained her weight, and now on Weight Watcher's diet plan at work... feeling well without new complaints or concerns... she had the 2010 Flu shot in Oct... needs 90d supply refills.   ~  April 28, 2010:  she is considering Bariatric Surg approach to her obesity, but notes that her [employer] of [place] requires her to have 6 monthly appts w/ her primary addressing her wt, diet, exercise program, and lifestyle mod efforts before tey will consider the surgical approach... she has already been to intake sessions at [facility] & in [place]> insurance requires a Programmer, systems...   Current Problem List:  ESSENTIAL HYPERTENSION (ICD-401.9) - controlled on LISINOPRIL/Hct 20-12.5> 1/2 tab daily... BP= 110/70 today & similar at home, & we discussed continuing this med at 1/2 tab daily... denies HA, visual changes, CP, palipit, dizziness, syncope, dyspnea, edema, etc... she will continue to monitor BP at home.  VENOUS INSUFFICIENCY, LEGS (ICD-459.81) - follows a low sodium diet, & we discussed elevation & support hose to minimize any swelling...  HYPERCHOLESTEROLEMIA (ICD-272.0) - on SIMVASTATIN 40mg /d...  ~  labs 2/06 showed  TChol 227, HDL 42, LDL 182 & start Simva 40;   ~  f/u 2/08 showed TChol 174, HDL 35, LDL 111...  ~  FLP 5/09 showed TChol 156, TG 59, HDL 37, LDL 107  ~  FLP 6/10 showed TChol 144, TG 49, HDL 46, LDL 88  ~  FLP 1/11 showed TChol 167, TG  74, HDL 46, LDL 106  OVERWEIGHT (ICD-278.02) - 200# in 2001; 207# in '02; 210# in '05; 219# in '07; & 211# 8/08...  ~  weight 5/09 = down to 175# with all the stress.  ~  weight 1/11 = 205#... on weight watchers now.  ~  weight 9/11 = 222# >> she is 61" tall, & BMI= 42.  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 ACID REFLUX DISEASE (ICD-530.81) - 8/08 eval w/ LER, improved w/ PROTONIX 40/d...  DIZZINESS (ICD-780.4) - uses mecliz as needed.  ANXIETY (ICD-300.00) - on ALPRAZOLAM 0.5mg  Qhs Prn...   Preventive Screening-Counseling & Management  Alcohol-Tobacco     Alcohol drinks/day: 0     Smoking Status: never  Allergies (verified): No Known Drug Allergies  Comments:  Nurse/Medical Assistant: The patient's medications and allergies were reviewed with the patient and were updated in the Medication and Allergy Lists.  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 Father alive age 58 w/ hx MI & CABG, smoker... Mother, Elwyn Reach, alive age 65 w/ hx breast ca, HBP, DM, Chol. 1 Sibling-  Brother, J.Norman Clay, alive age 43 w/ HBP, Chol.  Social History: Reviewed history from 02/08/2010 and no changes required. Married, husband= [place], 18 yrs... 2 Sons- one died suddenly 2006/12/11 ?cause. 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  The patient denies anorexia, fever, weight loss, weight gain, vision loss, decreased hearing, hoarseness, chest  pain, syncope, prolonged cough, headaches, hemoptysis, abdominal pain, melena, hematochezia, severe indigestion/heartburn, hematuria, incontinence, muscle weakness, suspicious skin lesions, transient blindness, difficulty walking, depression, unusual weight change, abnormal bleeding, enlarged lymph nodes, and angioedema.    Vital Signs:  Patient profile:   45 year old female Height:      61 inches Weight:      222 pounds BMI:     42.10 O2 Sat:      97 % on Room air Temp:     98.2 degrees F oral Pulse rate:   94 / minute BP sitting:   110 / 70  (left arm) Cuff size:   regular  Vitals Entered By: Randell Loop CMA (April 28, 2010 10:53 AM)  O2 Sat at Rest %:  97 O2 Flow:  Room air CC: 8 month ROV & review... Is Patient Diabetic? No Pain Assessment Patient in pain? no      Comments no changes inmeds today     Physical Exam  Additional Exam:  WD, overweight, 45 y/o WF in NAD... 222#, 61" tall, BMI= 42 GENERAL:  Alert & oriented; pleasant & cooperative. HEENT:  [place]/AT, EOM-full, PERRLA, EACs-clear, TMs-wnl, NOSE-clear, THROAT-clear & wnl. NECK:  Supple w/ full ROM; no JVD; normal carotid impulses w/o bruits; no thyromegaly or nodules palpated; no lymphadenopathy. CHEST:  Clear to P & A; without wheezes/ rales/ or rhonchi. HEART:  Regular Rhythm; without murmurs/ rubs/ or gallops. ABDOMEN:  Soft & nontender; normal bowel sounds; no organomegaly or masses detected. EXT: without deformities or arthritic changes; no varicose veins/ venous insuffic/ or edema.  DERM:  no lesions seen, no rashes, etc...    Impression & Recommendations:  Problem # 1:  OVERWEIGHT (ICD-278.02) We discussed the requirements of her Insurance com[pany & she will decide & let me know...  Problem # 2:  ESSENTIAL HYPERTENSION (ICD-401.9) Controlled on med>  taking 1/2 tab daily... Her updated medication list for this problem includes:    Lisinopril-hydrochlorothiazide 20-12.5 Mg Tabs  (Lisinopril-hydrochlorothiazide) .Marland Kitchen... Take as directed...  Problem # 3:  VENOUS INSUFFICIENCY, LEGS (ICD-459.81) We discussed no salt, elevation, support hose 7 consider stronger diuretic if nec later...  Problem # 4:  HYPERCHOLESTEROLEMIA (ICD-272.0) Stable on the Simva40... Her updated medication list for this problem includes:    Simvastatin 40 Mg Tabs (Simvastatin) .Marland Kitchen... Take 1 tab by mouth at bedtime...  Problem # 5:  OTHER MEDICAL PROBLEMS AS NOTED>>>  Complete Medication List: 1)  Lisinopril-hydrochlorothiazide 20-12.5 Mg Tabs (Lisinopril-hydrochlorothiazide) .... Take as directed... 2)  Simvastatin 40 Mg Tabs (Simvastatin) .... Take 1 tab by mouth at bedtime.Marland KitchenMarland Kitchen 3)  Protonix 40 Mg Tbec (Pantoprazole sodium) .... Take 1 tab by mouth once daily- 30 min before dinner... 4)  Alprazolam 0.5 Mg Tabs (Alprazolam) .... Take 1/2 to 1 tab three times a day as needed for nerves...  Patient Instructions: 1)  Today we updated your med list- see below.... 2)  I support your decision to investigate the Bariatric approach to weight reduction.Marland KitchenMarland Kitchen 3)  Let me know if we need to proceed w/ any formal BCBS program required prior to considering surgery...   Immunization History:  Influenza Immunization History:    Influenza:  historical (04/27/2010)

## 2010-09-01 NOTE — Letter (Signed)
Summary: Weight Loss Form/[facility]  Weight Loss Form/[facility]   Imported By: Sherian Rein 07/20/2010 10:45:47  _____________________________________________________________________  External Attachment:    Type:   Image     Comment:   External Document

## 2010-09-01 NOTE — Progress Notes (Signed)
Summary: prescript-LMTCB x 1  Phone Note Call from Patient   Caller: Patient Call For: Katelyn Bailey Summary of Call: pt would like simvastatin pantoprazole and lisinopril prescript for [facility] day [facility]  [name] [phone] Initial call taken by: Rickard Patience,  April 29, 2009 1:17 PM  Follow-up for Phone Call        needs to sched appt with SN. LMTCB [name]  April 29, 2009 2:53 PM  Spoke with pt.  Advised that she needs cpx with SN.  She was sched for 08/05/09 at 12 pm. Meds refilled to last until then.      Prescriptions: PROTONIX 40 MG  TBEC (PANTOPRAZOLE SODIUM) take 1 tab by mouth once daily- 30 min before dinner...  #90 x 1   Entered by:   [name]   Authorized by:   Michele Mcalpine MD   Signed by:   [name] on 04/29/2009   Method used:   Electronically to        [facility]  [address] (retail)       [address]       [place], [place]  [address]       Ph: [phone] or [phone]       Fax: [phone]   RxID:   [id number] SIMVASTATIN 40 MG  TABS (SIMVASTATIN) take 1 tab by mouth at bedtime...  #90 x 1   Entered by:   [name]   Authorized by:   Michele Mcalpine MD   Signed by:   [name] on 04/29/2009   Method used:   Electronically to        [facility]  [address] (retail)       [address]       [place], [place]  [address]       Ph: [phone] or [phone]       Fax: [phone]   RxID:   [id number] LISINOPRIL-HYDROCHLOROTHIAZIDE 20-12.5 MG  TABS (LISINOPRIL-HYDROCHLOROTHIAZIDE) take 1 tab by mouth once daily...  #90 x 1   Entered by:   [name]   Authorized by:   Michele Mcalpine MD   Signed by:   [name] on 04/29/2009   Method used:   Electronically to        [facility]  [address] (retail)       [address]       [place], [place]  [address]       Ph: [phone] or [phone]       Fax: [phone]   RxID:   [id number]

## 2010-09-01 NOTE — Progress Notes (Signed)
Summary: REFERRAL TO NUTRITIONIST  Phone Note Call from Patient Call back at [phone]   Caller: Patient Call For: NADEL Summary of Call: PT WANTS A REFERRAL TO A NUTRITIONIST  Initial call taken by: Lacinda Axon,  May 20, 2010 4:39 PM  Follow-up for Phone Call        pls ask sn if this is ok to order  Philipp Deputy [facility]  May 20, 2010 4:47 PM    order has been placed in the computer for [facility]---tried to call pt at number given but no answer---lvm to make pt aware that order has been sent in and that someone will be calling her with the date and time of this appt. Randell Loop CMA  May 20, 2010 5:03 PM

## 2010-09-01 NOTE — Letter (Signed)
Summary: [facility]  [facility]   Imported By: Sherian Rein 07/01/2010 13:53:02  _____________________________________________________________________  External Attachment:    Type:   Image     Comment:   External Document

## 2010-09-01 NOTE — Progress Notes (Signed)
Summary: status of gastric bypass surgery  Phone Note Call from Patient Call back at [phone]   Caller: Patient Call For: nadel Summary of Call: calling about gastric by pass surgery Initial call taken by: Rickard Patience,  June 16, 2010 2:59 PM  Follow-up for Phone Call        Spoke with pt.  She states that she mailed a form over attn SN approx 4 wks ago regarding gastric bypass surgery.  She is calling to check the status of this.  Pls advise thanks! Follow-up by: [name],  June 16, 2010 3:29 PM  Additional Follow-up for Phone Call Additional follow up Details #1::        per SN----please call [facility] and see if they have these forms that pt is asking about----called [facility] and lmom for medcial records to call me back Randell Loop CMA  June 17, 2010 10:14 AM     Additional Follow-up for Phone Call Additional follow up Details #2::    called [facility] and spoke with medical records and they have no papers from this pt---called and lmomtcb for pt since we do not have anything from her either. Randell Loop CMA  June 17, 2010 3:12 PM     pt returned my call and stated that this was the form from [facility] to initiate the process for the bariatric surgery---explained that we do not have this form and pt stated that she will call and have them fax over this form to have it filled out.  Randell Loop [facility]  June 17, 2010 3:34 PM

## 2010-09-07 NOTE — Assessment & Plan Note (Signed)
Summary: cpx/la   Primary Care Provider:  Alroy Dust, MD  CC:  Yearly CPX & bariatric eval....  History of Present Illness: 45 y/o WF here for a follow up visit... she has mult med problems as noted below...    ~  April 28, 2010:  she is considering Bariatric Surg approach to her obesity, but notes that her [employer] of [place] requires her to have 6 monthly appts w/ her primary addressing her wt, diet, exercise program, and lifestyle mod efforts before they will consider the surgical approach... she has already been to intake sessions at [facility] & in [place]> insurance requires a Programmer, systems...   ~  August 30, 2010:  Here for CPX & for 1st of the 6 required monthly assessments before bariatric surg will be considered by her insurance company ([employer] of [place]) >>>     MEDICALLY SUPERVISED WEIGHT LOSS DOCUMENTATION:         Patient's current weight=224#,  61"tall,  BMI=42.         Patient's Diet= low sodium, low carb, low fat         Exercise regimen= walking daily         Behavioral changes= smaller portions, NPO after dinner    BP well controlled on med;  Chol looks good on Simva40;  reflux symptoms resolved w/ PPI;  she is due for f/u by GYN DrSilva w/ PAP, Mammogram, etc...    Current Problem List:  ESSENTIAL HYPERTENSION (ICD-401.9) - controlled on LISINOPRIL/Hct 20-12.5> 1/2 tab daily... BP= 116/78 today & similar at home, & we discussed continuing this med at 1/2 tab daily... denies HA, visual changes, CP, palipit, dizziness, syncope, dyspnea, edema, etc... she will continue to monitor BP at home.  ~  CXR 1/12 showed normal heart size, clear lungs...  ~  EKG 1/12 showed NSR,  NSSTTWA...  VENOUS INSUFFICIENCY, LEGS (ICD-459.81) - follows a low sodium diet, & we discussed elevation & support hose to minimize any swelling...  HYPERCHOLESTEROLEMIA (ICD-272.0) - on SIMVASTATIN 40mg /d + low fat diet...  ~  FLP 2/06 showed  TChol 227, HDL 42, LDL 182 & rec to start Simva  40  ~  FLP 2/08 on Simva40 showed TChol 174, HDL 35, LDL 111  ~  FLP 5/09 on Simva40 showed TChol 156, TG 59, HDL 37, LDL 107  ~  FLP 6/10 on Simva40 showed TChol 144, TG 49, HDL 46, LDL 88  ~  FLP 1/11 on Simva40 showed TChol 167, TG 74, HDL 46, LDL 106  ~  FLP 1/12 on Simva40 showed TChol 173, TG 96, HDL 46, LDL 108  OVERWEIGHT (ICD-278.02) - 200# in 2001; 207# in '02; 210# in '05; 219# in '07; & 211# 8/08...  ~  weight 5/09 = down to 175# with all the stress (63 y/o son died suddenly 04-10-23 ?cause)  ~  weight 1/11 = 205#... on weight watchers now.  ~  weight 9/11 = 222#... she is 16" tall, & BMI= 42.  ~  weight 1/12 = 224#.Marland KitchenMarland Kitchen we reviewed low carb, low fat, low salt diet.  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 ACID REFLUX DISEASE (ICD-530.81) - 8/08 eval w/ LER, improved w/ PROTONIX 40/d...  DIZZINESS (ICD-780.4) - uses mecliz as needed.  ANXIETY (ICD-300.00) - on ALPRAZOLAM 0.5mg  Qhs Prn...  ~  04-10-23:  tragically her 66 y/o son died in his sleep 04/10/23 and autopsy showed no apparent cause, he was  healthy without medical problems... she lost 35 lbs w/ the stress... she is getting counselling thru hospice... she has a 35 y/o son at home.  Health Maintenance:  ~  GI:  given stool cards to check for hidden blood;  otherwise colonoscopy not due til after age 53...  ~  GYN:  DrSilva- with PAP & Mammogram each spring...  ~  Immunizations:  she gets the yearly Flu vaccines each autumn;  ?when last Tetanus shot was given & she will check her records;  she will not require a Pneumonia vaccine until age 71, unless the current recommendations change...   Current Medications (verified): 1)  Lisinopril-Hydrochlorothiazide 20-12.5 Mg  Tabs (Lisinopril-Hydrochlorothiazide) .... Take As Directed... 2)  Simvastatin 40 Mg  Tabs (Simvastatin) .... Take 1 Tab By Mouth At Bedtime.Marland KitchenMarland Kitchen 3)  Protonix 40 Mg  Tbec (Pantoprazole Sodium) .... Take 1 Tab By Mouth Once Daily- 30  Min Before [employer]... 4)  Alprazolam 0.5 Mg  Tabs (Alprazolam) .... Take 1/2 To 1 Tab Three Times A Day As Needed For Nerves...  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 Father alive age 1 w/ hx MI & CABG, smoker... Mother, Elwyn Reach, alive age 62 w/ hx breast ca, HBP, DM, Chol. 1 Sibling- Brother, J.Norman Clay, alive age 57 w/ HBP, Chol.  Social History: Reviewed history from 04/28/2010 and no changes required. Married, husband= [place], 18 yrs... 2 Sons- one died suddenly 2006-12-29 ?cause. 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  The patient denies fever, chills, sweats, anorexia, fatigue, weakness, weight loss, sleep disorder, blurring, diplopia, eye irritation, eye discharge, vision loss, eye pain, photophobia, earache, ear discharge, tinnitus, decreased hearing, nasal congestion, nosebleeds, sore throat, hoarseness, chest pain, palpitations, syncope, orthopnea, PND, peripheral edema, cough, dyspnea at rest, excessive sputum, hemoptysis, wheezing, pleurisy, nausea, vomiting, diarrhea, constipation, change in bowel habits, abdominal pain, melena, hematochezia, jaundice, gas/bloating, indigestion/heartburn, dysphagia, odynophagia, dysuria, hematuria, urinary frequency, urinary hesitancy, nocturia, incontinence, back pain, joint pain, joint swelling, muscle cramps, muscle weakness, stiffness, arthritis, sciatica, restless legs, leg pain at night, leg pain with  exertion, rash, itching, dryness, suspicious lesions, paralysis, paresthesias, seizures, tremors, vertigo, transient blindness, frequent falls, frequent headaches, difficulty walking, depression, anxiety, memory loss, confusion, cold intolerance, heat intolerance, polydipsia, polyphagia, polyuria, unusual weight change, abnormal bruising, bleeding, enlarged lymph nodes, urticaria, allergic rash, hay fever, and recurrent infections.    Vital Signs:  Patient profile:   45 year old female Height:      61 inches Weight:      223.50 pounds BMI:     42.38 O2 Sat:      98 % on Room air Temp:     98.0 degrees F oral Pulse rate:   93 / minute BP sitting:   116 / 78  (left arm) Cuff size:   large  Vitals Entered By: Carver Fila (August 30, 2010 2:50 PM)  O2 Flow:  Room air CC: Yearly CPX & bariatric eval...   Physical Exam  Additional Exam:  WD, overweight, 45 y/o WF in NAD.Marland KitchenMarland Kitchen 224#, 61" tall, BMI= 42 GENERAL:  Alert &  oriented; pleasant & cooperative. HEENT:  [place]/AT, EOM-full, PERRLA, EACs-clear, TMs-wnl, NOSE-clear, THROAT-clear & wnl. NECK:  Supple w/ full ROM; no JVD; normal carotid impulses w/o bruits; no thyromegaly or nodules palpated; no lymphadenopathy. CHEST:  Clear to P & A; without wheezes/ rales/ or rhonchi. HEART:  Regular Rhythm; without murmurs/ rubs/ or gallops. ABDOMEN:  Soft & nontender; normal bowel sounds; no organomegaly or masses detected. EXT: without deformities or arthritic changes; no varicose veins/ venous insuffic/ or edema.  DERM:  no lesions seen, no rashes, etc...    Impression & Recommendations:  Problem # 1:  PHYSICAL EXAMINATION (ICD-V70.0) Doing satis except that she can't seem to lose weight no matter what she does... Orders: 12 Lead EKG (12 Lead EKG) T-2 View CXR ([id number]) LABS ALL DONE 08/26/10 & reviewed w/ the patient...  Problem # 2:  ESSENTIAL HYPERTENSION (ICD-401.9) BP controlled on med + diet>  continue same. Her updated medication list  for this problem includes:    Lisinopril-hydrochlorothiazide 20-12.5 Mg Tabs (Lisinopril-hydrochlorothiazide) .Marland Kitchen... Take as directed...  Problem # 3:  HYPERCHOLESTEROLEMIA (ICD-272.0) FLP is reasonable on the Simva40>  she does not want to switch meds, and promises to lose weight... Her updated medication list for this problem includes:    Simvastatin 40 Mg Tabs (Simvastatin) .Marland Kitchen... Take 1 tab by mouth at bedtime...  Problem # 4:  OVERWEIGHT (ICD-278.02) Today is the 1st of 6 required appts for weight loss surveillence... we discussed diet, exercise, keeping a food diary, smaller plates, etc...  Problem # 5:  HIATAL HERNIA WITH REFLUX (ICD-553.3) Stable on the PPI Rx.Marland Kitchen Her updated medication list for this problem includes:    Protonix 40 Mg Tbec (Pantoprazole sodium) .Marland Kitchen... Take 1 tab by mouth once daily- 30 min before dinner...  Problem # 6:  ANXIETY (ICD-300.00) She has been under alot of stress w/ personal loss in 2008, not resting well, Alpraz Qhs helpsd some... Her updated medication list for this problem includes:    Alprazolam 0.5 Mg Tabs (Alprazolam) .Marland Kitchen... Take 1/2 to 1 tab three times a day as needed for nerves...  Complete Medication List: 1)  Lisinopril-hydrochlorothiazide 20-12.5 Mg Tabs (Lisinopril-hydrochlorothiazide) .... Take as directed... 2)  Simvastatin 40 Mg Tabs (Simvastatin) .... Take 1 tab by mouth at bedtime.Marland KitchenMarland Kitchen 3)  Protonix 40 Mg Tbec (Pantoprazole sodium) .... Take 1 tab by mouth once daily- 30 min before dinner... 4)  Alprazolam 0.5 Mg Tabs (Alprazolam) .... Take 1/2 to 1 tab three times a day as needed for nerves...  Patient Instructions: 1)  Today we updated your med list- see below.... 2)  We refilled your meds for 2012... 3)  Today was the first of the monthly visits required by your insurance company before considering bariatric surg... 4)  We reviewed your EKG & recent blood work... 5)  Today we did your follow up CXR> please call the "phone tree" in a  few days for your  results.Marland KitchenMarland Kitchen 6)  Please collect the stool card specimens so that we may check for hidden blood... 7)  Remember the low carb/ no sweets/ low fat diet... increase your exercise program as discussed... 8)  Please schedule a follow-up appointment in 1 month. Prescriptions: ALPRAZOLAM 0.5 MG  TABS (ALPRAZOLAM) take 1/2 to 1 tab three times a day as needed for nerves...  #100 x 6   Entered and Authorized by:   Michele Mcalpine MD   Signed by:   Michele Mcalpine MD on 08/30/2010   Method used:   Print  then Give to Patient   RxID:   [id number] PROTONIX 40 MG  TBEC (PANTOPRAZOLE SODIUM) take 1 tab by mouth once daily- 30 min before dinner...  #90 x 4   Entered and Authorized by:   Michele Mcalpine MD   Signed by:   Michele Mcalpine MD on 08/30/2010   Method used:   Print then Give to Patient   RxID:   [id number] SIMVASTATIN 40 MG  TABS (SIMVASTATIN) take 1 tab by mouth at bedtime...  #90 x 4   Entered and Authorized by:   Michele Mcalpine MD   Signed by:   Michele Mcalpine MD on 08/30/2010   Method used:   Print then Give to Patient   RxID:   [id number] LISINOPRIL-HYDROCHLOROTHIAZIDE 20-12.5 MG  TABS (LISINOPRIL-HYDROCHLOROTHIAZIDE) take as directed...  #90 x 4   Entered and Authorized by:   Michele Mcalpine MD   Signed by:   Michele Mcalpine MD on 08/30/2010   Method used:   Print then Give to Patient   RxID:   [id number]    Immunization History:  Influenza Immunization History:    Influenza:  historical (04/30/2010)

## 2010-09-08 ENCOUNTER — Other Ambulatory Visit: Payer: Self-pay

## 2010-09-08 ENCOUNTER — Other Ambulatory Visit: Payer: Self-pay | Admitting: Pulmonary Disease

## 2010-09-08 ENCOUNTER — Encounter (INDEPENDENT_AMBULATORY_CARE_PROVIDER_SITE_OTHER): Payer: Self-pay | Admitting: *Deleted

## 2010-09-08 DIAGNOSIS — Z0000 Encounter for general adult medical examination without abnormal findings: Secondary | ICD-10-CM

## 2010-09-08 LAB — HEMOCCULT SLIDES (X 3 CARDS)
Fecal Occult Blood: NEGATIVE
OCCULT 1: NEGATIVE
OCCULT 2: NEGATIVE
OCCULT 3: NEGATIVE
OCCULT 4: NEGATIVE
OCCULT 5: NEGATIVE

## 2010-09-22 ENCOUNTER — Ambulatory Visit: Payer: Self-pay | Admitting: Pulmonary Disease

## 2010-09-26 ENCOUNTER — Encounter: Payer: Self-pay | Admitting: Pulmonary Disease

## 2010-09-26 ENCOUNTER — Ambulatory Visit (INDEPENDENT_AMBULATORY_CARE_PROVIDER_SITE_OTHER): Payer: BC Managed Care – PPO | Admitting: Pulmonary Disease

## 2010-09-26 DIAGNOSIS — I872 Venous insufficiency (chronic) (peripheral): Secondary | ICD-10-CM

## 2010-09-26 DIAGNOSIS — K449 Diaphragmatic hernia without obstruction or gangrene: Secondary | ICD-10-CM

## 2010-09-26 DIAGNOSIS — I10 Essential (primary) hypertension: Secondary | ICD-10-CM

## 2010-09-26 DIAGNOSIS — E7800 Pure hypercholesterolemia, unspecified: Secondary | ICD-10-CM

## 2010-09-26 DIAGNOSIS — E663 Overweight: Secondary | ICD-10-CM

## 2010-09-27 NOTE — Letter (Signed)
Summary: [facility]   Imported By: Sherian Rein 09/20/2010 14:11:35  _____________________________________________________________________  External Attachment:    Type:   Image     Comment:   External Document

## 2010-09-29 ENCOUNTER — Ambulatory Visit: Payer: Self-pay | Admitting: *Deleted

## 2010-10-03 ENCOUNTER — Ambulatory Visit (HOSPITAL_COMMUNITY)
Admission: RE | Admit: 2010-10-03 | Discharge: 2010-10-03 | Disposition: A | Discharge status: Home / Self Care | Payer: BC Managed Care – PPO | Source: Ambulatory Visit | Attending: Surgery | Admitting: Surgery

## 2010-10-05 ENCOUNTER — Encounter: Payer: BC Managed Care – PPO | Attending: Surgery | Admitting: *Deleted

## 2010-10-05 DIAGNOSIS — Z713 Dietary counseling and surveillance: Secondary | ICD-10-CM | POA: Insufficient documentation

## 2010-10-05 DIAGNOSIS — Z01818 Encounter for other preprocedural examination: Secondary | ICD-10-CM | POA: Insufficient documentation

## 2010-10-06 NOTE — Assessment & Plan Note (Signed)
Summary: 1 month follow up/ms   Primary Care Provider:  Alroy Dust, MD  CC:  1 month follow up---nutrition.  History of Present Illness: 45 y/o WF here for a follow up visit... she has mult med problems as noted below...    ~  April 28, 2010:  she is considering Bariatric Surg approach to her obesity, but notes that her [employer] of [place] requires her to have 6 monthly appts w/ her primary addressing her wt, diet, exercise program, and lifestyle mod efforts before they will consider the surgical approach... she has already been to intake sessions at [facility] & in [place]> insurance requires a Programmer, systems...   ~  August 30, 2010:  Here for CPX & for 1st of the 6 required monthly assessments before bariatric surg will be considered by her insurance company ([employer] of [place]) >>>     MEDICALLY SUPERVISED WEIGHT LOSS DOCUMENTATION:         Patient's current weight=224#,  61"tall,  BMI=42         Patient's Diet= low sodium, low carb, low fat         Exercise regimen= walking daily         Behavioral changes= smaller portions, NPO after dinner    BP well controlled on med;  Chol looks good on Simva40;  reflux symptoms resolved w/ PPI;  she is due for f/u by GYN DrSilva w/ PAP, Mammogram, etc...   ~  September 26, 2010:  Here for 2nd installment of her medically supervised weight loss program:    MEDICALLY SUPERVISED WEIGHT LOSS DOCUMENTATION:         Patient's current weight=219#,  61"tall,  BMI=41         Patient's Diet= low sodium, low carb, low fat         Exercise regimen= walking daily outdoors & on treadmill         Behavioral changes= smaller portions, NPO after dinner, eating out less    Current Problem List:  ESSENTIAL HYPERTENSION (ICD-401.9) - controlled on LISINOPRIL/Hct 20-12.5> 1/2 tab daily... BP= 122/80 today & similar at home, & we discussed continuing this med at 1/2 tab daily... denies HA, visual changes, CP, palipit, dizziness, syncope, dyspnea, edema, etc...  she will continue to monitor BP at home.  ~  CXR 1/12 showed normal heart size, clear lungs...  ~  EKG 1/12 showed NSR,  NSSTTWA...  VENOUS INSUFFICIENCY, LEGS (ICD-459.81) - follows a low sodium diet, & we discussed elevation & support hose to minimize any swelling...  HYPERCHOLESTEROLEMIA (ICD-272.0) - on SIMVASTATIN 40mg /d + low fat diet...  ~  FLP 2/06 showed  TChol 227, HDL 42, LDL 182 & rec to start Simva 40  ~  FLP 2/08 on Simva40 showed TChol 174, HDL 35, LDL 111  ~  FLP 5/09 on Simva40 showed TChol 156, TG 59, HDL 37, LDL 107  ~  FLP 6/10 on Simva40 showed TChol 144, TG 49, HDL 46, LDL 88  ~  FLP 1/11 on Simva40 showed TChol 167, TG 74, HDL 46, LDL 106  ~  FLP 1/12 on Simva40 showed TChol 173, TG 96, HDL 46, LDL 108  OVERWEIGHT (ICD-278.02) - 200# in 2001; 207# in '02; 210# in '05; 219# in '07; & 211# 8/08...  ~  weight 5/09 = down to 175# with all the stress (75 y/o son died suddenly 05-01-2023 ?cause)  ~  weight 1/11 = 205#... on weight watchers now.  ~  weight  9/11 = 222#... she is 41" tall, & BMI= 42.  ~  weight 1/12 = 224#.Marland KitchenMarland Kitchen we reviewed low carb, low fat, low salt diet.  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 ACID REFLUX DISEASE (ICD-530.81) - 8/08 eval w/ LER, improved w/ PROTONIX 40/d...  DIZZINESS (ICD-780.4) - uses mecliz as needed.  ANXIETY (ICD-300.00) - on ALPRAZOLAM 0.5mg  Qhs Prn...  ~  05-07-23:  tragically her 51 y/o son died in his sleep 05-07-23 and autopsy showed no apparent cause, he was healthy without medical problems... she lost 35 lbs w/ the stress... she is getting counselling thru hospice... she has a 88 y/o son at home.  Health Maintenance:  ~  GI:  given stool cards to check for hidden blood;  otherwise colonoscopy not due til after age 77...  ~  GYN:  DrSilva- with PAP & Mammogram each spring...  ~  Immunizations:  she gets the yearly Flu vaccines each autumn;  ?when last Tetanus shot was given &  she will check her records;  she will not require a Pneumonia vaccine until age 89, unless the current recommendations change...   Preventive Screening-Counseling & Management  Alcohol-Tobacco     Alcohol drinks/day: 0     Smoking Status: never  Allergies (verified): No Known Drug Allergies  Comments:  Nurse/Medical Assistant: The patient's medications and allergies were reviewed with the patient and were updated in the Medication and Allergy Lists.  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 Father alive age 24 w/ hx MI & CABG, smoker... Mother, Elwyn Reach, alive age 37 w/ hx breast ca, HBP, DM, Chol. 1 Sibling- Brother, J.Norman Clay, alive age 60 w/ HBP, Chol.  Social History: Reviewed history from 04/28/2010 and no changes required. Married, husband= [place], 18 yrs... 2 Sons- one died suddenly January 08, 2007 ?cause. 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 [employer]  Review of Systems      See HPI  The patient denies anorexia, fever, weight loss, weight gain, vision loss, decreased hearing, hoarseness, chest pain, syncope, dyspnea on exertion, peripheral edema, prolonged cough, headaches, hemoptysis, abdominal pain, melena, hematochezia, severe indigestion/heartburn, hematuria, incontinence, muscle weakness, suspicious skin lesions, transient blindness, difficulty walking, depression, unusual weight change, abnormal bleeding, enlarged lymph nodes, and angioedema.    Vital Signs:  Patient profile:   45 year old female Height:      61 inches Weight:      218.38 pounds BMI:     41.41 O2  Sat:      99 % on Room air Temp:     97.2 degrees F oral Pulse rate:   71 / minute BP sitting:   122 / 80  (left arm) Cuff size:   large  Vitals Entered By: Randell Loop CMA (September 26, 2010 3:56 PM)  O2 Sat at Rest %:  99 O2 Flow:  Room air CC: 1 month follow up---nutrition Is Patient Diabetic? No Pain Assessment Patient in pain? no      Comments no changes in meds today   Physical Exam  Additional Exam:  WD, overweight, 45 y/o WF in NAD... 219#, 61" tall, BMI= 41 GENERAL:  Alert & oriented; pleasant &  cooperative. HEENT:  [place]/AT, EOM-full, PERRLA, EACs-clear, TMs-wnl, NOSE-clear, THROAT-clear & wnl. NECK:  Supple w/ full ROM; no JVD; normal carotid impulses w/o bruits; no thyromegaly or nodules palpated; no lymphadenopathy. CHEST:  Clear to P & A; without wheezes/ rales/ or rhonchi. HEART:  Regular Rhythm; without murmurs/ rubs/ or gallops. ABDOMEN:  Soft & nontender; normal bowel sounds; no organomegaly or masses detected. EXT: without deformities or arthritic changes; no varicose veins/ venous insuffic/ or edema.  DERM:  no lesions seen, no rashes, etc...    Impression & Recommendations:  Problem # 1:  OVERWEIGHT (ICD-278.02) Today is the second in a required 6 monthly OVs from her insurance company... Imroved on diet, exercise, behav modification...  Problem # 2:  ESSENTIAL HYPERTENSION (ICD-401.9) Remains controlled on med>  continue same. Her updated medication list for this problem includes:    Lisinopril-hydrochlorothiazide 20-12.5 Mg Tabs (Lisinopril-hydrochlorothiazide) .Marland Kitchen... Take as directed...  Problem # 3:  VENOUS INSUFFICIENCY, LEGS (ICD-459.81) Stable>  no active edema & she is restricting sodium...  Problem # 4:  HYPERCHOLESTEROLEMIA (ICD-272.0) Stable on Simva40... Her updated medication list for this problem includes:    Simvastatin 40 Mg Tabs (Simvastatin) .Marland Kitchen... Take 1 tab by mouth at bedtime...  Problem # 5:  HIATAL HERNIA WITH REFLUX  (ICD-553.3) Stable on PPI therapy... Her updated medication list for this problem includes:    Protonix 40 Mg Tbec (Pantoprazole sodium) .Marland Kitchen... Take 1 tab by mouth once daily- 30 min before dinner...  Problem # 6:  OTHER MEEICAL PROBLEMS AS NOTED>>>  Complete Medication List: 1)  Lisinopril-hydrochlorothiazide 20-12.5 Mg Tabs (Lisinopril-hydrochlorothiazide) .... Take as directed... 2)  Simvastatin 40 Mg Tabs (Simvastatin) .... Take 1 tab by mouth at bedtime.Marland KitchenMarland Kitchen 3)  Protonix 40 Mg Tbec (Pantoprazole sodium) .... Take 1 tab by mouth once daily- 30 min before dinner... 4)  Alprazolam 0.5 Mg Tabs (Alprazolam) .... Take 1/2 to 1 tab three times a day as needed for nerves...    Appended Document: Orders Update     Clinical Lists Changes  Orders: Added new Service order of Est. Patient Level III (40981) - Signed

## 2010-10-11 ENCOUNTER — Other Ambulatory Visit (HOSPITAL_COMMUNITY): Payer: Self-pay

## 2010-10-25 ENCOUNTER — Encounter: Payer: Self-pay | Admitting: Pulmonary Disease

## 2010-10-25 ENCOUNTER — Ambulatory Visit (INDEPENDENT_AMBULATORY_CARE_PROVIDER_SITE_OTHER): Payer: BC Managed Care – PPO | Admitting: Pulmonary Disease

## 2010-10-25 DIAGNOSIS — E663 Overweight: Secondary | ICD-10-CM

## 2010-10-25 DIAGNOSIS — I10 Essential (primary) hypertension: Secondary | ICD-10-CM

## 2010-10-25 DIAGNOSIS — F411 Generalized anxiety disorder: Secondary | ICD-10-CM

## 2010-10-25 DIAGNOSIS — E7800 Pure hypercholesterolemia, unspecified: Secondary | ICD-10-CM

## 2010-10-25 NOTE — Assessment & Plan Note (Signed)
Stable on the Alprazolam 0.5mg  Prn.Marland KitchenMarland Kitchen

## 2010-10-25 NOTE — Patient Instructions (Signed)
We updated your med list today... Keep up the great job w/ diet & exercise!!! Call for any problems... Let's plan the 4th in our series of 6 visits in one month.Marland KitchenMarland Kitchen

## 2010-10-25 NOTE — Progress Notes (Signed)
Subjective:    Patient ID: Katelyn Bailey, female    DOB: 10-11-1965, 45 y.o.   MRN: [id number]  HPI 45 y/o WF here for a follow up visit... she has mult med problems including:  HBP, Ven Insuffic;  Hyperchol;  Obesity;  HH/ GERD; and Anxiety...   ~  April 28, 2010:  she is considering Bariatric Surg approach to her obesity, but notes that her [employer] of [place] requires her to have 6 monthly appts w/ her primary addressing her wt, diet, exercise program, and lifestyle mod efforts before they will consider the surgical approach... she has already been to intake sessions at [facility] & in [place]> insurance requires a Programmer, systems...  ~  August 30, 2010:  Here for CPX & for 1st of the 6 required monthly assessments before bariatric surg will be considered by her insurance company ([employer] of [place]) >>>     MEDICALLY SUPERVISED WEIGHT LOSS DOCUMENTATION:         Patient's current weight=224#,  61"tall,  BMI=42         Patient's Diet= low sodium, low carb, low fat         Exercise regimen= walking daily         Behavioral changes= smaller portions, NPO after dinner     BP well controlled on med;  Chol looks good on Simva40;  reflux symptoms resolved w/ PPI;  she is due for f/u by GYN DrSilva w/ PAP, Mammogram, etc...     Health Maintenance: ~  GI:  given stool cards to check for hidden blood;  otherwise colonoscopy not due til after age 6... ~  GYN:  DrSilva- with PAP & Mammogram each spring... ~  Immunizations:  she gets the yearly Flu vaccines each autumn;  ?when last Tetanus shot was given & she will check her records;  she will not require a Pneumonia vaccine until age 45, unless the current recommendations change...  ~  September 26, 2010:  Here for 2nd installment of her medically supervised weight loss program:    MEDICALLY SUPERVISED WEIGHT LOSS DOCUMENTATION:         Patient's current weight=219#,  61" tall,  BMI=41         Patient's Diet= low sodium, low carb, low fat    Exercise regimen= walking daily outdoors & on treadmill         Behavioral changes= smaller portions, NPO after dinner, eating out less  ~  October 25, 2010:  Here for 3rd installment of her medically supervised weight loss program:    MEDICALLY SUPERVISED WEIGHT LOSS DOCUMENTATION:         Patient's current weight=214#,  61" tall,  BMI=40.5         Patient's Diet= low sodium, low carb, low fat         Exercise regimen= walking daily outdoors & on treadmill> rarely misses a day!         Behavioral changes= smaller portions, NPO after dinner, eating out less & exercising more   Review of Systems       See HPI  The patient denies anorexia, fever, weight loss, weight gain, vision loss, decreased hearing, hoarseness, chest pain, syncope, dyspnea on exertion, peripheral edema, prolonged cough, headaches, hemoptysis, abdominal pain, melena, hematochezia, severe indigestion/heartburn, hematuria, incontinence, muscle weakness, suspicious skin lesions, transient blindness, difficulty walking, depression, unusual weight change, abnormal bleeding, enlarged lymph nodes, and angioedema.     Objective:   Physical Exam  WD, overweight, 45 y/o WF in NAD... 214#, 61" tall, BMI= 40.5 GENERAL:  Alert & oriented; pleasant & cooperative. HEENT:  [place]/AT, EOM-full, PERRLA, EACs-clear, TMs-wnl, NOSE-clear, THROAT-clear & wnl. NECK:  Supple w/ full ROM; no JVD; normal carotid impulses w/o bruits; no thyromegaly or nodules palpated; no lymphadenopathy. CHEST:  Clear to P & A; without wheezes/ rales/ or rhonchi. HEART:  Regular Rhythm; without murmurs/ rubs/ or gallops. ABDOMEN:  Soft & nontender; normal bowel sounds; no organomegaly or masses detected. EXT: without deformities or arthritic changes; no varicose veins/ venous insuffic/ or edema.  DERM:  no lesions seen, no rashes, etc...   Assessment & Plan:

## 2010-10-25 NOTE — Assessment & Plan Note (Signed)
Her FLP has been reasonably well controlled on the Simva40;  Continue same + great diet efforts.Marland KitchenMarland Kitchen

## 2010-10-25 NOTE — Assessment & Plan Note (Signed)
BP remains well controlled on Lisinopril/HCT;  Continue 1/2 tab daily.Marland KitchenMarland Kitchen

## 2010-11-15 ENCOUNTER — Ambulatory Visit (HOSPITAL_COMMUNITY): Payer: BC Managed Care – PPO

## 2010-11-22 ENCOUNTER — Other Ambulatory Visit (HOSPITAL_COMMUNITY): Payer: BC Managed Care – PPO

## 2010-11-22 ENCOUNTER — Ambulatory Visit (HOSPITAL_COMMUNITY)
Admission: RE | Admit: 2010-11-22 | Discharge: 2010-11-22 | Disposition: A | Discharge status: Home / Self Care | Payer: BC Managed Care – PPO | Source: Ambulatory Visit | Attending: Surgery | Admitting: Surgery

## 2010-11-22 DIAGNOSIS — K219 Gastro-esophageal reflux disease without esophagitis: Secondary | ICD-10-CM | POA: Insufficient documentation

## 2010-11-22 DIAGNOSIS — K449 Diaphragmatic hernia without obstruction or gangrene: Secondary | ICD-10-CM | POA: Insufficient documentation

## 2010-11-22 DIAGNOSIS — Z6841 Body Mass Index (BMI) 40.0 and over, adult: Secondary | ICD-10-CM | POA: Insufficient documentation

## 2010-11-22 DIAGNOSIS — E7800 Pure hypercholesterolemia, unspecified: Secondary | ICD-10-CM | POA: Insufficient documentation

## 2010-11-22 DIAGNOSIS — I10 Essential (primary) hypertension: Secondary | ICD-10-CM | POA: Insufficient documentation

## 2010-11-23 ENCOUNTER — Encounter: Payer: Self-pay | Admitting: Pulmonary Disease

## 2010-11-25 ENCOUNTER — Ambulatory Visit (INDEPENDENT_AMBULATORY_CARE_PROVIDER_SITE_OTHER): Payer: BC Managed Care – PPO | Admitting: Pulmonary Disease

## 2010-11-25 ENCOUNTER — Encounter: Payer: Self-pay | Admitting: Pulmonary Disease

## 2010-11-25 DIAGNOSIS — E663 Overweight: Secondary | ICD-10-CM

## 2010-11-25 DIAGNOSIS — I10 Essential (primary) hypertension: Secondary | ICD-10-CM

## 2010-11-25 DIAGNOSIS — E7800 Pure hypercholesterolemia, unspecified: Secondary | ICD-10-CM

## 2010-11-25 DIAGNOSIS — F411 Generalized anxiety disorder: Secondary | ICD-10-CM

## 2010-11-25 NOTE — Progress Notes (Signed)
Subjective:    Patient ID: Katelyn Bailey, female    DOB: 28-Jan-1966, 45 y.o.   MRN: [id number]  HPI  45 y/o WF here for a follow up visit... she has mult med problems including:  HBP, Ven Insuffic;  Hyperchol;  Obesity;  HH/ GERD; and Anxiety...   ~  April 28, 2010:  she is considering Bariatric Surg approach to her obesity, but notes that her [employer] of [place] requires her to have 6 monthly appts w/ her primary addressing her wt, diet, exercise program, and lifestyle mod efforts before they will consider the surgical approach... she has already been to intake sessions at [facility] & in [place]> insurance requires a Programmer, systems...  ~  August 30, 2010:  Here for CPX & for 1st of the 6 required monthly assessments before bariatric surg will be considered by her insurance company ([employer] of [place]) >    BP well controlled on med;  Chol looks good on Simva40;  reflux symptoms resolved w/ PPI;  she is due for f/u by GYN DrSilva w/ PAP, Mammogram, etc...     MEDICALLY SUPERVISED WEIGHT LOSS DOCUMENTATION:         Patient's current weight=224#,  61"tall,  BMI=42         Patient's Diet= low sodium, low carb, low fat         Exercise regimen= walking daily         Behavioral changes= smaller portions, NPO after dinner  ~  September 26, 2010:  Here for 2nd installment of her medically supervised weight loss program:    MEDICALLY SUPERVISED WEIGHT LOSS DOCUMENTATION:         Patient's current weight=219#,  61" tall,  BMI=41         Patient's Diet= low sodium, low carb, low fat         Exercise regimen= walking daily outdoors & on treadmill         Behavioral changes= smaller portions, NPO after dinner, eating out less  ~  October 25, 2010:  Here for 3rd installment of her medically supervised weight loss program:    MEDICALLY SUPERVISED WEIGHT LOSS DOCUMENTATION:         Patient's current weight=214#,  61" tall,  BMI=40.5         Patient's Diet= low sodium, low carb, low fat  Exercise regimen= walking daily outdoors & on treadmill> rarely misses a day!         Behavioral changes= smaller portions, NPO after dinner, eating out less & exercising more  ~  November 25, 2010:  Here for 4th installment of her medically supervised weight loss program:    MEDICALLY SUPERVISED WEIGHT LOSS DOCUMENTATION:         Patient's current weight=207#,  61" tall,  BMI=39         Patient's Diet= low sodium, low carb, low fat- she has been resisting temptation to overeat.         Exercise regimen= walking daily outdoors & on treadmill> rarely misses a day!         Behavioral changes= smaller portions & smaller plate, NPO after dinner, eating out less & reading labels.         Problem List:  ESSENTIAL HYPERTENSION (ICD-401.9) - controlled on LISINOPRIL/Hct 20-12.5> 1/2 tab daily... BP= 122/80 today & similar at home, & we discussed continuing this med at 1/2 tab daily... denies HA, visual changes, CP, palipit, dizziness, syncope, dyspnea, edema, etc... she  will continue to monitor BP at home. ~  CXR 1/12 showed normal heart size, clear lungs... ~  EKG 1/12 showed NSR,  NSSTTWA...  VENOUS INSUFFICIENCY, LEGS (ICD-459.81) - follows a low sodium diet, & we discussed elevation & support hose to minimize any swelling...  HYPERCHOLESTEROLEMIA (ICD-272.0) - on SIMVASTATIN 40mg /d + low fat diet... ~  FLP 2/06 showed  TChol 227, HDL 42, LDL 182 & rec to start Simva 40 ~  FLP 2/08 on Simva40 showed TChol 174, HDL 35, LDL 111 ~  FLP 5/09 on Simva40 showed TChol 156, TG 59, HDL 37, LDL 107 ~  FLP 6/10 on Simva40 showed TChol 144, TG 49, HDL 46, LDL 88 ~  FLP 1/11 on Simva40 showed TChol 167, TG 74, HDL 46, LDL 106 ~  FLP 1/12 on Simva40 showed TChol 173, TG 96, HDL 46, LDL 108  OVERWEIGHT (ICD-278.02) - 200# in 2001; 207# in '02; 210# in '05; 219# in '07; & 211# 8/08... ~  weight 5/09 = down to 175# with all the stress (21 y/o son died suddenly 05/04/23 ?cause) ~  weight 1/11 = 205#... on weight  watchers now. ~  weight 9/11 = 222#... she is 50" tall, & BMI= 42. ~  weight 1/12 = 224#.Marland KitchenMarland Kitchen we reviewed low carb, low fat, low salt diet. 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 ACID REFLUX DISEASE (ICD-530.81) - 8/08 eval w/ LER, improved w/ PROTONIX 40/d...  DIZZINESS (ICD-780.4) - uses mecliz as needed.  ANXIETY (ICD-300.00) - on ALPRAZOLAM 0.5mg  Qhs Prn... ~  May 04, 2023:  tragically her 61 y/o son died in his sleep 05/04/23 and autopsy showed no apparent cause, he was healthy without medical problems... she lost 35 lbs w/ the stress... she is getting counselling thru hospice... she has a 3 y/o son at home.  Health Maintenance: ~  GI:  given stool cards to check for hidden blood;  otherwise colonoscopy not due til after age 68... ~  GYN:  DrSilva- with PAP & Mammogram each spring... ~  Immunizations:  she gets the yearly Flu vaccines each autumn;  ?when last Tetanus shot was given & she will check her records;  she will not require a Pneumonia vaccine until age 43, unless the current recommendations change...   Past Surgical History  Procedure Date  . Cesarean section     Outpatient Encounter Prescriptions as of 11/25/2010  Medication Sig Dispense Refill  . ALPRAZolam (XANAX) 0.5 MG tablet Take 1/2 to 1 tablet by mouth three times daily as needed for nerves       . lisinopril-hydrochlorothiazide (PRINZIDE,ZESTORETIC) 20-12.5 MG per tablet Take as directed       . pantoprazole (PROTONIX) 40 MG tablet Take 40 mg by mouth daily. 30 minutes prior to dinner       . simvastatin (ZOCOR) 40 MG tablet Take 40 mg by mouth at bedtime.          No Known Allergies   Review of Systems       See HPI - all other systems neg except as noted...  The patient denies anorexia, fever, weight loss, weight gain, vision loss, decreased hearing, hoarseness, chest pain, syncope, dyspnea on exertion, peripheral edema, prolonged cough, headaches,  hemoptysis, abdominal pain, melena, hematochezia, severe indigestion/heartburn, hematuria, incontinence, muscle weakness, suspicious skin lesions, transient blindness, difficulty walking, depression, unusual weight change, abnormal bleeding, enlarged lymph nodes, and angioedema.     Objective:  Physical Exam    WD, overweight, 45 y/o WF in NAD.Marland KitchenMarland Kitchen 207#, 61" tall, BMI=39 GENERAL:  Alert & oriented; pleasant & cooperative. HEENT:  [place]/AT, EOM-full, PERRLA, EACs-clear, TMs-wnl, NOSE-clear, THROAT-clear & wnl. NECK:  Supple w/ full ROM; no JVD; normal carotid impulses w/o bruits; no thyromegaly or nodules palpated; no lymphadenopathy. CHEST:  Clear to P & A; without wheezes/ rales/ or rhonchi. HEART:  Regular Rhythm; without murmurs/ rubs/ or gallops. ABDOMEN:  Soft & nontender; normal bowel sounds; no organomegaly or masses detected. EXT: without deformities or arthritic changes; no varicose veins/ venous insuffic/ or edema.  DERM:  no lesions seen, no rashes, etc...   Assessment & Plan:   OBESITY>  She continues to work hard at diet & exercise; she has lost 7# this past month (her best month yet) despite signif temptations to overeat etc...  HBP>  BP today is lower than prev on he Lisinopril/HCT & we discussed the poss of decr to 1/2 tab daily if BP at home checks similarly low or if she feels weak etc...  CHOL>  She remains on Simva40 daily + her low chol/ low fat diet; we will recheck FLP later...  Anxiety>  She is coping well w/ the stressors in her life.Marland KitchenMarland Kitchen

## 2010-11-25 NOTE — Patient Instructions (Signed)
We updated your med list in our EPIC system...    If your BP trends lower, and you feel weak, try decreasing the Lisinopril/HCT to 1/2 tab daily...  Keep up the good work w/ your diet & exercise regimen... We will plan to see you back for the 5th installment in one month.Marland KitchenMarland Kitchen

## 2010-12-01 ENCOUNTER — Telehealth: Payer: Self-pay | Admitting: Pulmonary Disease

## 2010-12-01 NOTE — Telephone Encounter (Signed)
Spoke w/ pt and she states her apt for 5/25n w/ SN is on the day she has to be at a meeting at work. Spoke w/ leigh and pt is coming in 5/21 at 11:30. Pt is aware of apt date and time and nothing further was needed

## 2010-12-16 NOTE — Op Note (Signed)
[facility]  Patient:    Katelyn Bailey, Katelyn Bailey Visit Number: [id number] MRN: [id number]          Service Type: SUR Location: 1E 0105 01 Attending Physician:  Donnetta Hutching Dictated by:   Verl Dicker, M.D. Proc. Date: 11/15/01 Admit Date:  11/15/2001   CC:         Georgina Peer, M.D.   Operative Report  DATE OF BIRTH:  28-Sep-1965  GYNECOLOGIST:  Georgina Peer, M.D.  UROLOGIST:  Verl Dicker, M.D.  PREOPERATIVE DIAGNOSIS:  2 x 4 mm calcification right ureterovesical junction.  POSTOPERATIVE DIAGNOSIS:  2 x 4 mm calcification right ureterovesical junction.  PROCEDURE: 1. Cystoscopy. 2. Ureteroscopy. 3. Stone manipulation. 4. Retrogrades.  ANESTHESIA:  General.  INDICATIONS:  Two and a half to three days of intractable right CVA tenderness with nausea, vomiting, but no fever.  A 45 year old female, white count 12.2. Discussed with patient and family members the various options available to [place]. I am inclined to proceed with stone manipulation.  DESCRIPTION OF PROCEDURE:  The patient was prepped and draped in the dorsal lithotomy position after institution of an adequate level of general anesthesia.  Careful inspection of the bladder using well-lubricated panendoscope, normal urethra and sphincter, normal left orifice and left retrograde.  Right orifice showed stone protruding.  Stone was gently displaced back into the proximal ureter using a 6 French end-hole catheter. Retrograde showed filling defect in the distal ureter consistent with stone. Guidewire was negotiated beyond the stone, coiled nicely in the upper pole calices.  End-hole catheter was withdrawn as was the cystoscope.  A 6 French ureteroscope was inserted alongside the guidewire.  Stone was easily negotiated into a 3.5 [place] basket and then withdrawn.  Ureteroscope was reinserted.  No additional stoney fragments identified within the distal two-thirds of the  ureter.  Bladder was drained.  Ureteroscope was removed.  No stent was left indwelling.  The patient was returned to recovery in satisfactory condition. Dictated by:   Verl Dicker, M.D. Attending Physician:  Donnetta Hutching DD:  11/15/01 TD:  11/15/01 Job: [id number]

## 2010-12-19 ENCOUNTER — Ambulatory Visit (INDEPENDENT_AMBULATORY_CARE_PROVIDER_SITE_OTHER): Payer: BC Managed Care – PPO | Admitting: Pulmonary Disease

## 2010-12-19 ENCOUNTER — Encounter: Payer: Self-pay | Admitting: Pulmonary Disease

## 2010-12-19 DIAGNOSIS — I10 Essential (primary) hypertension: Secondary | ICD-10-CM

## 2010-12-19 DIAGNOSIS — E7800 Pure hypercholesterolemia, unspecified: Secondary | ICD-10-CM

## 2010-12-19 DIAGNOSIS — E663 Overweight: Secondary | ICD-10-CM

## 2010-12-19 DIAGNOSIS — F411 Generalized anxiety disorder: Secondary | ICD-10-CM

## 2010-12-19 DIAGNOSIS — I872 Venous insufficiency (chronic) (peripheral): Secondary | ICD-10-CM

## 2010-12-19 DIAGNOSIS — K449 Diaphragmatic hernia without obstruction or gangrene: Secondary | ICD-10-CM

## 2010-12-19 NOTE — Patient Instructions (Signed)
Today we updated your med list in EPIC...     Next month will be our 6th visit required by your insurance company before they will consider your reqest for Bariatric surg>    Please return fasting in June for follow up blood work at that time...  Call for any questions.Marland KitchenMarland Kitchen

## 2010-12-19 NOTE — Progress Notes (Signed)
Subjective:    Patient ID: Katelyn Bailey, female    DOB: 07/03/1966, 45 y.o.   MRN: [id number]  HPI  45 y/o WF here for a follow up visit... she has mult med problems including:  HBP, Ven Insuffic;  Hyperchol;  Obesity;  HH/ GERD; and Anxiety...   ~  April 28, 2010:  she is considering Bariatric Surg approach to her obesity, but notes that her [employer] [place] requires her to have 6 monthly appts w/ her primary addressing her wt, diet, exercise program, and lifestyle mod efforts before they will consider the surgical approach... she has already been to intake sessions at [facility] & in [place]> insurance requires a Programmer, systems...  ~  August 30, 2010:  Here for CPX & for 1st of the 6 required monthly assessments before bariatric surg will be considered by her insurance company ([employer] [place]) >    BP well controlled on med;  Chol looks good on Simva40;  reflux symptoms resolved w/ PPI;  she is due for f/u by GYN DrSilva w/ PAP, Mammogram, etc...     MEDICALLY SUPERVISED WEIGHT LOSS DOCUMENTATION:         Patient's current weight=224#,  61"tall,  BMI=42         Patient's Diet= low sodium, low carb, low fat         Exercise regimen= walking daily         Behavioral changes= smaller portions, NPO after dinner  ~  September 26, 2010:  Here for 2nd installment of her medically supervised weight loss program:    MEDICALLY SUPERVISED WEIGHT LOSS DOCUMENTATION:         Patient's current weight=219#,  61" tall,  BMI=41         Patient's Diet= low sodium, low carb, low fat         Exercise regimen= walking daily outdoors & on treadmill         Behavioral changes= smaller portions, NPO after dinner, eating out less  ~  October 25, 2010:  Here for 3rd installment of her medically supervised weight loss program:    MEDICALLY SUPERVISED WEIGHT LOSS DOCUMENTATION:         Patient's current weight=214#,  61" tall,  BMI=40.5         Patient's Diet= low sodium, low carb, low fat  Exercise regimen= walking daily outdoors & on treadmill> rarely misses a day!         Behavioral changes= smaller portions, NPO after dinner, eating out less & exercising more  ~  November 25, 2010:  Here for 4th installment of her medically supervised weight loss program:    MEDICALLY SUPERVISED WEIGHT LOSS DOCUMENTATION:         Patient's current weight=207#,  61" tall,  BMI=39         Patient's Diet= low sodium, low carb, low fat- she has been resisting temptation to overeat.         Exercise regimen= walking daily outdoors & on treadmill> rarely misses a day!         Behavioral changes= smaller portions & smaller plate, NPO after dinner, eating out less & reading labels.  ~  Dec 19, 2010:  Here for 5th installment of her medically supervised weight loss program:    MEDICALLY SUPERVISED WEIGHT LOSS DOCUMENTATION:         Patient's current weight=206#,  61" tall,  BMI=39         Patient's  Diet= low sodium, low carb, low fat         Exercise regimen= walking daily outdoors & on treadmill> rarely misses a day!         Behavioral changes= smaller portions & smaller plate, NPO after dinner, eating out less & reading labels.          Problem List:  ESSENTIAL HYPERTENSION (ICD-401.9) - controlled on LISINOPRIL/Hct 20-12.5> 1/2 tab daily... BP= 124/88 today & similar at home, & we discussed continuing this med at 1/2 tab daily... denies HA, visual changes, CP, palipit, dizziness, syncope, dyspnea, edema, etc... she will continue to monitor BP at home. ~  CXR 1/12 showed normal heart size, clear lungs... ~  EKG 1/12 showed NSR,  NSSTTWA...  VENOUS INSUFFICIENCY, LEGS (ICD-459.81) - follows a low sodium diet, & we discussed elevation & support hose to minimize any swelling...  HYPERCHOLESTEROLEMIA (ICD-272.0) - on SIMVASTATIN 40mg /d + low fat diet... ~  FLP 2/06 showed  TChol 227, HDL 42, LDL 182 & rec to start Simva 40 ~  FLP 2/08 on Simva40 showed TChol 174, HDL 35, LDL 111 ~  FLP 5/09 on  Simva40 showed TChol 156, TG 59, HDL 37, LDL 107 ~  FLP 6/10 on Simva40 showed TChol 144, TG 49, HDL 46, LDL 88 ~  FLP 1/11 on Simva40 showed TChol 167, TG 74, HDL 46, LDL 106 ~  FLP 1/12 on Simva40 showed TChol 173, TG 96, HDL 46, LDL 108  OVERWEIGHT (ICD-278.02) - 200# in 2001; 207# in '02; 210# in '05; 219# in '07; & 211# 8/08... ~  weight 5/09 = down to 175# with all the stress (27 y/o son died suddenly 04-24-2023 ?cause) ~  weight 1/11 = 205#... on weight watchers now. ~  weight 9/11 = 222#... she is 33" tall, & BMI= 42. ~  weight 1/12 = 224#.Marland KitchenMarland Kitchen we reviewed low carb, low fat, low salt diet. 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 ACID REFLUX DISEASE (ICD-530.81) - 8/08 eval w/ LER, improved w/ PROTONIX 40/d...  DIZZINESS (ICD-780.4) - uses mecliz as needed.  ANXIETY (ICD-300.00) - on ALPRAZOLAM 0.5mg  Qhs Prn... ~  04-24-2023:  tragically her 63 y/o son died in his sleep 2023/04/24 and autopsy showed no apparent cause, he was healthy without medical problems... she lost 35 lbs w/ the stress... she is getting counselling thru hospice... she has a 87 y/o son at home.  Health Maintenance: ~  GI:  given stool cards to check for hidden blood;  otherwise colonoscopy not due til after age 52... ~  GYN:  DrSilva- with PAP & Mammogram each spring... ~  Immunizations:  she gets the yearly Flu vaccines each autumn;  ?when last Tetanus shot was given & she will check her records;  she will not require a Pneumonia vaccine until age 6, unless the current recommendations change...   Past Surgical History  Procedure Date  . Cesarean section     Outpatient Encounter Prescriptions as of 12/19/2010  Medication Sig Dispense Refill  . ALPRAZolam (XANAX) 0.5 MG tablet Take 1/2 to 1 tablet by mouth three times daily as needed for nerves       . doxycycline (VIBRAMYCIN) 100 MG capsule Take  100 mg by mouth daily.        Marland Kitchen lisinopril-hydrochlorothiazide (PRINZIDE,ZESTORETIC) 20-12.5 MG  per tablet Take as directed       . pantoprazole (PROTONIX) 40 MG tablet Take 40 mg by mouth daily. 30 minutes prior to dinner       . simvastatin (ZOCOR) 40 MG tablet Take 40 mg by mouth at bedtime.          No Known Allergies   Review of Systems       See HPI - all other systems neg except as noted...  The patient denies anorexia, fever, weight loss, weight gain, vision loss, decreased hearing, hoarseness, chest pain, syncope, dyspnea on exertion, peripheral edema, prolonged cough, headaches, hemoptysis, abdominal pain, melena, hematochezia, severe indigestion/heartburn, hematuria, incontinence, muscle weakness, suspicious skin lesions, transient blindness, difficulty walking, depression, unusual weight change, abnormal bleeding, enlarged lymph nodes, and angioedema.     Objective:   Physical Exam    WD, overweight, 45 y/o WF in NAD.Marland KitchenMarland Kitchen 207#, 61" tall, BMI=39 GENERAL:  Alert & oriented; pleasant & cooperative. HEENT:  [place]/AT, EOM-full, PERRLA, EACs-clear, TMs-wnl, NOSE-clear, THROAT-clear & wnl. NECK:  Supple w/ full ROM; no JVD; normal carotid impulses w/o bruits; no thyromegaly or nodules palpated; no lymphadenopathy. CHEST:  Clear to P & A; without wheezes/ rales/ or rhonchi. HEART:  Regular Rhythm; without murmurs/ rubs/ or gallops. ABDOMEN:  Soft & nontender; normal bowel sounds; no organomegaly or masses detected. EXT: without deformities or arthritic changes; no varicose veins/ venous insuffic/ or edema.  DERM:  no lesions seen, no rashes, etc...   Assessment & Plan:   OBESITY>  She continues to work hard at diet & exercise; she has lost 1# this past month despite signif temptations to overeat etc...  HBP>  BP today is stable on the Lisinopril/HCT & we discussed continuing this med regularly...  CHOL>  She remains on Simva40 daily + her low chol/ low fat diet; we will recheck FLP  later...  Anxiety>  She is coping well w/ the stressors in her life.Marland KitchenMarland Kitchen

## 2010-12-23 ENCOUNTER — Ambulatory Visit: Payer: BC Managed Care – PPO | Admitting: Pulmonary Disease

## 2010-12-26 ENCOUNTER — Encounter: Payer: Self-pay | Admitting: Pulmonary Disease

## 2011-01-18 ENCOUNTER — Ambulatory Visit: Payer: BC Managed Care – PPO | Admitting: Pulmonary Disease

## 2011-01-25 ENCOUNTER — Encounter: Payer: Self-pay | Admitting: Pulmonary Disease

## 2011-01-27 ENCOUNTER — Other Ambulatory Visit (INDEPENDENT_AMBULATORY_CARE_PROVIDER_SITE_OTHER): Payer: BC Managed Care – PPO

## 2011-01-27 ENCOUNTER — Encounter: Payer: Self-pay | Admitting: Pulmonary Disease

## 2011-01-27 ENCOUNTER — Ambulatory Visit (INDEPENDENT_AMBULATORY_CARE_PROVIDER_SITE_OTHER): Payer: BC Managed Care – PPO | Admitting: Pulmonary Disease

## 2011-01-27 DIAGNOSIS — E7800 Pure hypercholesterolemia, unspecified: Secondary | ICD-10-CM

## 2011-01-27 DIAGNOSIS — I10 Essential (primary) hypertension: Secondary | ICD-10-CM

## 2011-01-27 DIAGNOSIS — I872 Venous insufficiency (chronic) (peripheral): Secondary | ICD-10-CM

## 2011-01-27 DIAGNOSIS — E663 Overweight: Secondary | ICD-10-CM

## 2011-01-27 LAB — HEPATIC FUNCTION PANEL
ALT: 17 U/L (ref 0–35)
AST: 16 U/L (ref 0–37)
Albumin: 4 g/dL (ref 3.5–5.2)
Alkaline Phosphatase: 53 U/L (ref 39–117)
Bilirubin, Direct: 0.1 mg/dL (ref 0.0–0.3)
Total Bilirubin: 0.5 mg/dL (ref 0.3–1.2)
Total Protein: 7.1 g/dL (ref 6.0–8.3)

## 2011-01-27 LAB — LIPID PANEL
Cholesterol: 122 mg/dL (ref 0–200)
HDL: 40.3 mg/dL (ref >39.00)
LDL Cholesterol: 70 mg/dL (ref 0–99)
Total CHOL/HDL Ratio: 3
Triglycerides: 57 mg/dL (ref 0.0–149.0)
VLDL: 11.4 mg/dL (ref 0.0–40.0)

## 2011-01-27 LAB — CBC WITH DIFFERENTIAL/PLATELET
Basophils Absolute: 0 10*3/uL (ref 0.0–0.1)
Basophils Relative: 0.6 % (ref 0.0–3.0)
Eosinophils Absolute: 0 10*3/uL (ref 0.0–0.7)
Eosinophils Relative: 0.6 % (ref 0.0–5.0)
HCT: 36.7 % (ref 36.0–46.0)
Hemoglobin: 12.2 g/dL (ref 12.0–15.0)
Lymphocytes Relative: 31.9 % (ref 12.0–46.0)
Lymphs Abs: 2.3 10*3/uL (ref 0.7–4.0)
MCHC: 33.3 g/dL (ref 30.0–36.0)
MCV: 86.9 fl (ref 78.0–100.0)
Monocytes Absolute: 0.5 10*3/uL (ref 0.1–1.0)
Monocytes Relative: 6.3 % (ref 3.0–12.0)
Neutro Abs: 4.3 10*3/uL (ref 1.4–7.7)
Neutrophils Relative %: 60.6 % (ref 43.0–77.0)
Platelets: 189 10*3/uL (ref 150.0–400.0)
RBC: 4.22 Mil/uL (ref 3.87–5.11)
RDW: 14.4 % (ref 11.5–14.6)
WBC: 7.2 10*3/uL (ref 4.5–10.5)

## 2011-01-27 LAB — BASIC METABOLIC PANEL
BUN: 12 mg/dL (ref 6–23)
CO2: 25 mEq/L (ref 19–32)
Calcium: 8.8 mg/dL (ref 8.4–10.5)
Chloride: 107 mEq/L (ref 96–112)
Creatinine, Ser: 0.7 mg/dL (ref 0.4–1.2)
GFR: 92.49 mL/min (ref >60.00)
Glucose, Bld: 91 mg/dL (ref 70–99)
Potassium: 3.8 mEq/L (ref 3.5–5.1)
Sodium: 139 mEq/L (ref 135–145)

## 2011-01-27 LAB — TSH: TSH: 0.94 u[IU]/mL (ref 0.35–5.50)

## 2011-01-30 ENCOUNTER — Encounter: Payer: Self-pay | Admitting: Pulmonary Disease

## 2011-01-30 NOTE — Patient Instructions (Signed)
Today we updated your med list in our EPIC system...    Continue your present meds the same...  We will send all of your information to the [facility] coordinator so they can proceed w/ your planned surgery...  Call for any questions...  Let's plan a follow up visit in about 3 months, sooner if needed for problems.Marland KitchenMarland Kitchen

## 2011-01-30 NOTE — Progress Notes (Signed)
Subjective:    Patient ID: Katelyn Bailey, female    DOB: [birth year], 45 y.o.   MRN: [id number]  HPI 45 y/o WF here for a follow up visit... she has mult med problems including:  HBP, Ven Insuffic;  Hyperchol;  Obesity;  HH/ GERD; and Anxiety...   ~  April 28, 2010:  she is considering Bariatric Surg approach to her obesity, but notes that her [employer] [place] requires her to have 6 monthly appts w/ her primary addressing her wt, diet, exercise program, and lifestyle mod efforts before they will consider the surgical approach... she has already been to intake sessions at [facility] & in [place]> insurance requires a Programmer, systems...  ~  August 30, 2010:  Here for CPX & for 1st of the 6 required monthly assessments before bariatric surg will be considered by her insurance company ([employer] of [place]) >    BP well controlled on med;  Chol looks good on Simva40;  reflux symptoms resolved w/ PPI;  she is due for f/u by GYN DrSilva w/ PAP, Mammogram, etc...    MEDICALLY SUPERVISED WEIGHT LOSS DOCUMENTATION:         Patient's current weight=224#,  61"tall,  BMI=42         Patient's Diet= low sodium, low carb, low fat         Exercise regimen= walking daily         Behavioral changes= smaller portions, NPO after dinner  ~  September 26, 2010:  Here for 2nd installment of her medically supervised weight loss program:    MEDICALLY SUPERVISED WEIGHT LOSS DOCUMENTATION:         Patient's current weight=219#,  61" tall,  BMI=41         Patient's Diet= low sodium, low carb, low fat         Exercise regimen= walking daily outdoors & on treadmill         Behavioral changes= smaller portions, NPO after dinner, eating out less  ~  October 25, 2010:  Here for 3rd installment of her medically supervised weight loss program:    MEDICALLY SUPERVISED WEIGHT LOSS DOCUMENTATION:         Patient's current weight=214#,  61" tall,  BMI=40.5         Patient's Diet= low sodium, low carb, low fat         Exercise  regimen= walking daily outdoors & on treadmill> rarely misses a day!         Behavioral changes= smaller portions, NPO after dinner, eating out less & exercising more  ~  November 25, 2010:  Here for 4th installment of her medically supervised weight loss program:    MEDICALLY SUPERVISED WEIGHT LOSS DOCUMENTATION:         Patient's current weight=207#,  61" tall,  BMI=39         Patient's Diet= low sodium, low carb, low fat- she has been resisting temptation to overeat.         Exercise regimen= walking daily outdoors & on treadmill> rarely misses a day!         Behavioral changes= smaller portions & smaller plate, NPO after dinner, eating out less & reading labels.  ~  Dec 19, 2010:  Here for 5th installment of her medically supervised weight loss program:    MEDICALLY SUPERVISED WEIGHT LOSS DOCUMENTATION:         Patient's current weight=206#,  61" tall,  BMI=39  Patient's Diet= low sodium, low carb, low fat         Exercise regimen= walking daily outdoors & on treadmill> rarely misses a day!         Behavioral changes= smaller portions & smaller plate, NPO after dinner, eating out less & reading labels.  ~  January 27, 2011:  Here for her 6th installment of her medically supervised weight loss program:    MEDICALLY SUPERVISED WEIGHT LOSS DOCUMENTATION:         Patient's current weight=202#,  61" tall,  BMI=38         Patient's Diet= low sodium, low carb, low fat         Exercise regimen= walking daily outdoors & on treadmill> rarely misses a day!         Behavioral changes= smaller portions & smaller plate, NPO after dinner, eating out less & reading labels> she has been practicing eating portions required of pts after bariatric surg...    LABS today showed FLP great on Simva40;  Chems WNL;  CBC OK w/ Hg= 12.2 (add MVI w/ Fe);  TSH WNL.Marland KitchenMarland Kitchen         Problem List:  ESSENTIAL HYPERTENSION (ICD-401.9) - controlled on LISINOPRIL/Hct 20-12.5> 1/2 tab daily... BP= 118/76 today & similar at  home, & we discussed continuing this med at 1/2 tab daily... denies HA, visual changes, CP, palipit, dizziness, syncope, dyspnea, edema, etc... she will continue to monitor BP at home. ~  CXR 1/12 showed normal heart size, clear lungs... ~  EKG 1/12 showed NSR,  NSSTTWA...  VENOUS INSUFFICIENCY, LEGS (ICD-459.81) - follows a low sodium diet, & we discussed elevation & support hose to minimize any swelling...  HYPERCHOLESTEROLEMIA (ICD-272.0) - on SIMVASTATIN 40mg /d + low fat diet... ~  FLP 2/06 showed  TChol 227, HDL 42, LDL 182 & rec to start Simva 40 ~  FLP 2/08 on Simva40 showed TChol 174, HDL 35, LDL 111 ~  FLP 5/09 on Simva40 showed TChol 156, TG 59, HDL 37, LDL 107 ~  FLP 6/10 on Simva40 showed TChol 144, TG 49, HDL 46, LDL 88 ~  FLP 1/11 on Simva40 showed TChol 167, TG 74, HDL 46, LDL 106 ~  FLP 1/12 on Simva40 showed TChol 173, TG 96, HDL 46, LDL 108 ~  FLP 6/12 on Simva40 showed TChol 122, TG 57, HDL 40, LDL 70  OVERWEIGHT (ICD-278.02) - 200# in 2001; 207# in '02; 210# in '05; 219# in '07; & 211# 8/08... ~  weight 5/09 = down to 175# with all the stress (36 y/o son died suddenly 07-May-2023 ?cause) ~  weight 1/11 = 205#... on weight watchers now. ~  weight 9/11 = 222#... she is 6" tall, & BMI= 42. ~  weight 1/12 = 224#.Marland KitchenMarland Kitchen we reviewed low carb, low fat, low salt diet. 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 ACID REFLUX DISEASE (ICD-530.81) - 8/08 eval w/ LER, improved w/ PROTONIX 40/d...  DIZZINESS (ICD-780.4) - uses mecliz as needed.  ANXIETY (ICD-300.00) - on ALPRAZOLAM 0.5mg  Qhs Prn... ~  05-07-2023:  tragically her 5 y/o son died in his sleep 07-May-2023 and autopsy showed no apparent cause, he was healthy without medical problems... she lost 35 lbs w/ the stress... she is getting counselling thru hospice... she  has a 39 y/o son at  home.  Health Maintenance: ~  GI:  given stool cards to check for hidden blood;  otherwise colonoscopy not due til after age 33... ~  GYN:  DrSilva- with PAP & Mammogram each spring... ~  Immunizations:  she gets the yearly Flu vaccines each autumn;  ?when last Tetanus shot was given & she will check her records;  she will not require a Pneumonia vaccine until age 49, unless the current recommendations change...   Past Surgical History  Procedure Date  . Cesarean section     Outpatient Encounter Prescriptions as of 01/27/2011  Medication Sig Dispense Refill  . ALPRAZolam (XANAX) 0.5 MG tablet Take 1/2 to 1 tablet by mouth three times daily as needed for nerves       . doxycycline (ORACEA) 40 MG capsule Take 40 mg by mouth every morning.        Marland Kitchen lisinopril-hydrochlorothiazide (PRINZIDE,ZESTORETIC) 20-12.5 MG per tablet Take as directed       . pantoprazole (PROTONIX) 40 MG tablet Take 40 mg by mouth daily. 30 minutes prior to dinner       . simvastatin (ZOCOR) 40 MG tablet Take 40 mg by mouth at bedtime.        Marland Kitchen DISCONTD: doxycycline (VIBRAMYCIN) 100 MG capsule Take 100 mg by mouth daily.          No Known Allergies   Review of Systems       See HPI - all other systems neg except as noted...  The patient denies anorexia, fever, weight loss, weight gain, vision loss, decreased hearing, hoarseness, chest pain, syncope, dyspnea on exertion, peripheral edema, prolonged cough, headaches, hemoptysis, abdominal pain, melena, hematochezia, severe indigestion/heartburn, hematuria, incontinence, muscle weakness, suspicious skin lesions, transient blindness, difficulty walking, depression, unusual weight change, abnormal bleeding, enlarged lymph nodes, and angioedema.     Objective:   Physical Exam    WD, overweight, 45 y/o WF in NAD.Marland KitchenMarland Kitchen 207#, 61" tall, BMI=39 GENERAL:  Alert & oriented; pleasant & cooperative. HEENT:  [place]/AT, EOM-full, PERRLA, EACs-clear, TMs-wnl, NOSE-clear, THROAT-clear &  wnl. NECK:  Supple w/ full ROM; no JVD; normal carotid impulses w/o bruits; no thyromegaly or nodules palpated; no lymphadenopathy. CHEST:  Clear to P & A; without wheezes/ rales/ or rhonchi. HEART:  Regular Rhythm; without murmurs/ rubs/ or gallops. ABDOMEN:  Soft & nontender; normal bowel sounds; no organomegaly or masses detected. EXT: without deformities or arthritic changes; no varicose veins/ venous insuffic/ or edema.  DERM:  no lesions seen, no rashes, etc...   Assessment & Plan:   OBESITY>  She continues to work hard at diet & exercise; she has lost 22# over the last 29mo despite signif temptations to overeat etc; she is very excited & more committed than ever to have the Bariatric surg...  HBP>  BP today is stable on the Lisinopril/HCT & we discussed continuing this med regularly...  CHOL>  She remains on Simva40 daily + her low chol/ low fat diet...  Anxiety>  She is coping well w/ the stressors in her life.Marland KitchenMarland Kitchen

## 2011-03-09 ENCOUNTER — Encounter: Payer: BC Managed Care – PPO | Attending: Surgery

## 2011-03-09 DIAGNOSIS — Z713 Dietary counseling and surveillance: Secondary | ICD-10-CM | POA: Insufficient documentation

## 2011-03-09 DIAGNOSIS — Z01818 Encounter for other preprocedural examination: Secondary | ICD-10-CM | POA: Insufficient documentation

## 2011-03-09 NOTE — Progress Notes (Signed)
  Pre-Operative Nutrition Class  Patient was seen on 03/09/2011 for Pre-Operative Nutrition education at the [facility].   Surgery date: 03/20/11 Surgery type: Gastric Bypass  Weight today: 198.8 lbs Weight change: 14.5lbs lost Total weight lost: 14.5 lbs BMI: 37.5%  Samples given per MNT protocol: Bariatric Advantage Multivitamin Lot # [id number] Exp: 12/12  Bariatric Advantage Calcium Citrate Lot # [id number] Exp: 9/13  Celebrate Vitamins Multivitamin Lot # [id number] Exp: 9/13  Celebrate VitaminsCalcium Citrate Lot #[id number] Exp: 4/13  The following the learning objective met by the patient during this course:   Identifies Pre-Op Dietary Goals and will begin 2 weeks pre-operatively   Identifies appropriate sources of fluids and proteins   States protein recommendations and appropriate sources pre and post-operatively  Identifies Post-Operative Dietary Goals and will follow for 2 weeks post-operatively  Identifies appropriate multivitamin and calcium sources  Describes the need for physical activity post-operatively and will follow MD recommendations  States when to call healthcare provider regarding medication questions or post-operative complications  Follow-Up Plan: Patient will follow-up at [facility] 2 weeks post operatively for diet advancement per MD.

## 2011-03-15 ENCOUNTER — Ambulatory Visit (INDEPENDENT_AMBULATORY_CARE_PROVIDER_SITE_OTHER): Payer: BC Managed Care – PPO | Admitting: Surgery

## 2011-03-15 ENCOUNTER — Other Ambulatory Visit (INDEPENDENT_AMBULATORY_CARE_PROVIDER_SITE_OTHER): Payer: Self-pay | Admitting: Surgery

## 2011-03-15 ENCOUNTER — Encounter (INDEPENDENT_AMBULATORY_CARE_PROVIDER_SITE_OTHER): Payer: Self-pay | Admitting: Surgery

## 2011-03-15 ENCOUNTER — Encounter (HOSPITAL_COMMUNITY): Payer: BC Managed Care – PPO

## 2011-03-15 LAB — CBC
HCT: 39.6 % (ref 36.0–46.0)
Hemoglobin: 12.8 g/dL (ref 12.0–15.0)
MCH: 27.5 pg (ref 26.0–34.0)
MCHC: 32.3 g/dL (ref 30.0–36.0)
MCV: 85.2 fL (ref 78.0–100.0)
Platelets: 231 10*3/uL (ref 150–400)
RBC: 4.65 MIL/uL (ref 3.87–5.11)
RDW: 14.3 % (ref 11.5–15.5)
WBC: 8.3 10*3/uL (ref 4.0–10.5)

## 2011-03-15 LAB — COMPREHENSIVE METABOLIC PANEL
ALT: 18 U/L (ref 0–35)
AST: 17 U/L (ref 0–37)
Albumin: 3.7 g/dL (ref 3.5–5.2)
Alkaline Phosphatase: 67 U/L (ref 39–117)
BUN: 13 mg/dL (ref 6–23)
CO2: 24 mEq/L (ref 19–32)
Calcium: 9.9 mg/dL (ref 8.4–10.5)
Chloride: 102 mEq/L (ref 96–112)
Creatinine, Ser: 0.76 mg/dL (ref 0.50–1.10)
GFR calc Af Amer: >60 mL/min (ref >60)
GFR calc non Af Amer: >60 mL/min (ref >60)
Glucose, Bld: 120 mg/dL — ABNORMAL HIGH (ref 70–99)
Potassium: 4.2 mEq/L (ref 3.5–5.1)
Sodium: 138 mEq/L (ref 135–145)
Total Bilirubin: 0.3 mg/dL (ref 0.3–1.2)
Total Protein: 7.4 g/dL (ref 6.0–8.3)

## 2011-03-15 LAB — SURGICAL PCR SCREEN
MRSA, PCR: NEGATIVE
Staphylococcus aureus: NEGATIVE

## 2011-03-15 LAB — DIFFERENTIAL
Basophils Absolute: 0 10*3/uL (ref 0.0–0.1)
Basophils Relative: 0 % (ref 0–1)
Eosinophils Absolute: 0 10*3/uL (ref 0.0–0.7)
Eosinophils Relative: 0 % (ref 0–5)
Lymphocytes Relative: 28 % (ref 12–46)
Lymphs Abs: 2.4 10*3/uL (ref 0.7–4.0)
Monocytes Absolute: 0.6 10*3/uL (ref 0.1–1.0)
Monocytes Relative: 7 % (ref 3–12)
Neutro Abs: 5.4 10*3/uL (ref 1.7–7.7)
Neutrophils Relative %: 64 % (ref 43–77)

## 2011-03-15 LAB — HCG, SERUM, QUALITATIVE: Preg, Serum: NEGATIVE

## 2011-03-15 NOTE — Progress Notes (Signed)
Chief Complaint  Patient presents with  . Bariatric Pre-op    Katelyn Bailey is a 45 y.o. (DOB: [birth year])  white female who is a patient of Katelyn M, MD, MD and comes to me today for pre op visit for RYGB.  She has done well from her supervised diet by Dr. Kriste Basque.  She has lost over 20 pounds since I saw her in Feb., 2012.  I reviewed with the patient the labs we had done on her.  On 22 November 2010, her upper GI showed a small hiatal hernia. On 22 November 2010, her chest x-ray was negative. On 22 November 2010, her ultrasound was normal. There were no gallstones. On 22 November 2010, her EKG was normal. On 01 October 2010, her Breath Tek assessment was negative.  Her blood work from August 26, 2010 was normal. Specifically, her cholesterol was 173 and her glucose was 100.  We have a letter from Dr. Lyanne Co approving her from a psychiatric standpoint dated 03 November 2010.  [name] has done so well with her weight loss, we even talked about not doing surgery. However, she still anxious to have gastric bypass.  Past Medical History  Diagnosis Date  . Essential hypertension   . Venous insufficiency   . Hypercholesterolemia   . Overweight   . Hiatal hernia   . Acid reflux disease   . Dizziness   . Anxiety     Past Surgical History  Procedure Date  . Tubal ligation 1997  . Cesarean section 1990    Current Outpatient Prescriptions  Medication Sig Dispense Refill  . ALPRAZolam (XANAX) 0.5 MG tablet Take 1/2 to 1 tablet by mouth three times daily as needed for nerves       . doxycycline (ORACEA) 40 MG capsule Take 40 mg by mouth every morning.        Marland Kitchen lisinopril-hydrochlorothiazide (PRINZIDE,ZESTORETIC) 20-12.5 MG per tablet Take as directed       . pantoprazole (PROTONIX) 40 MG tablet Take 40 mg by mouth daily. 30 minutes prior to dinner       . simvastatin (ZOCOR) 40 MG tablet Take 40 mg by mouth at bedtime.          No Known Allergies  SOCIAL and FAMILY HISTORY: She works  for "[employer]" and wants to return to work earlier than 4 weeks.  We'll see how she does with surgery. Her husband will be there the day of surgery.  PHYSICAL EXAM: Ht 5\' 1"  (1.549 Bailey)  Wt 196 lb 8 oz (89.132 kg)  BMI 37.13 kg/m2  HEENT: Normal. Pupils equal. Normal dentition. Neck: Supple. No thyroid mass. Lymph Nodes:  No supraclavicular or cervical nodes. Lungs: Clear and symmetric. Heart:  RRR. No murmur.  Abdomen: No mass. No tenderness. No hernia. Normal bowel sounds. Only scars are from a C section. She is about half apple/half pear in shape. Rectal: Not done. Extremities:  Good strength in upper and lower extremities. Neurologic:  Grossly intact to motor and sensory function.   DATA REVIEWED: See HPI.  ASSESSMENT and PLAN: 1.  Morbid obesity.  She is scheduled for RYGB on Monday, 03/20/11.  I again reviewed the surgery with her. Per the 1991 NIH Consensus Statement, the patient is a candidate for bariatric surgery.    The patient remains interested in the Roux en Y Gastric Bypass.  I discussed with the patient the indications and risks of bariatric surgery.  The potential risks of surgery include, but are  not limited to, bleeding, infection, leak from the bowel, DVT and PE, long term nutrition consequences, and death.  The patient understands the importance of compliance and long term follow-up with our group after surgery. I gave her a prescription for the bowel prep. SHe's already on the pre op diet.  #2. Gastroesophageal reflux disease. #3. Hypertension for 10 years. #4. Hypercholesterolemia. On meds. #5. Mild anxiety.

## 2011-03-15 NOTE — Patient Instructions (Addendum)
You have the day before surgery bowel prep.

## 2011-03-16 ENCOUNTER — Telehealth (INDEPENDENT_AMBULATORY_CARE_PROVIDER_SITE_OTHER): Payer: Self-pay

## 2011-03-16 NOTE — Telephone Encounter (Signed)
Pt called and stated that she is on Depo Provera.  Her last injection was July 3rd.  She wanted [place] to know since her surgery is Monday, August 20th.  I informed Dr Ezzard Standing and he said this is ok.  I left a message for the pt about this.

## 2011-03-20 ENCOUNTER — Inpatient Hospital Stay (HOSPITAL_COMMUNITY)
Admission: RE | Admit: 2011-03-20 | Discharge: 2011-03-22 | DRG: 288 | Disposition: A | Discharge status: Home / Self Care | Payer: BC Managed Care – PPO | Source: Ambulatory Visit | Attending: Surgery | Admitting: Surgery

## 2011-03-20 DIAGNOSIS — Z6837 Body mass index (BMI) 37.0-37.9, adult: Secondary | ICD-10-CM

## 2011-03-20 DIAGNOSIS — Z79899 Other long term (current) drug therapy: Secondary | ICD-10-CM

## 2011-03-20 DIAGNOSIS — I10 Essential (primary) hypertension: Secondary | ICD-10-CM | POA: Diagnosis present

## 2011-03-20 DIAGNOSIS — F411 Generalized anxiety disorder: Secondary | ICD-10-CM | POA: Diagnosis present

## 2011-03-20 DIAGNOSIS — Z01812 Encounter for preprocedural laboratory examination: Secondary | ICD-10-CM

## 2011-03-20 DIAGNOSIS — E7800 Pure hypercholesterolemia, unspecified: Secondary | ICD-10-CM | POA: Diagnosis present

## 2011-03-20 DIAGNOSIS — K219 Gastro-esophageal reflux disease without esophagitis: Secondary | ICD-10-CM | POA: Diagnosis present

## 2011-03-20 HISTORY — PX: ROUX-EN-Y GASTRIC BYPASS: SHX1104

## 2011-03-20 LAB — HEMOGLOBIN AND HEMATOCRIT, BLOOD
HCT: 34.9 % — ABNORMAL LOW (ref 36.0–46.0)
Hemoglobin: 11.7 g/dL — ABNORMAL LOW (ref 12.0–15.0)

## 2011-03-21 ENCOUNTER — Inpatient Hospital Stay (HOSPITAL_COMMUNITY): Payer: BC Managed Care – PPO

## 2011-03-21 DIAGNOSIS — Z09 Encounter for follow-up examination after completed treatment for conditions other than malignant neoplasm: Secondary | ICD-10-CM

## 2011-03-21 LAB — DIFFERENTIAL
Basophils Absolute: 0 10*3/uL (ref 0.0–0.1)
Basophils Relative: 0 % (ref 0–1)
Eosinophils Absolute: 0 10*3/uL (ref 0.0–0.7)
Eosinophils Relative: 0 % (ref 0–5)
Lymphocytes Relative: 14 % (ref 12–46)
Lymphs Abs: 1.3 10*3/uL (ref 0.7–4.0)
Monocytes Absolute: 0.6 10*3/uL (ref 0.1–1.0)
Monocytes Relative: 7 % (ref 3–12)
Neutro Abs: 7.2 10*3/uL (ref 1.7–7.7)
Neutrophils Relative %: 80 % — ABNORMAL HIGH (ref 43–77)

## 2011-03-21 LAB — CBC
HCT: 33.1 % — ABNORMAL LOW (ref 36.0–46.0)
Hemoglobin: 11.1 g/dL — ABNORMAL LOW (ref 12.0–15.0)
MCH: 28.7 pg (ref 26.0–34.0)
MCHC: 33.5 g/dL (ref 30.0–36.0)
MCV: 85.5 fL (ref 78.0–100.0)
Platelets: 160 10*3/uL (ref 150–400)
RBC: 3.87 MIL/uL (ref 3.87–5.11)
RDW: 14.4 % (ref 11.5–15.5)
WBC: 9.1 10*3/uL (ref 4.0–10.5)

## 2011-03-21 LAB — HEMOGLOBIN AND HEMATOCRIT, BLOOD
HCT: 34.8 % — ABNORMAL LOW (ref 36.0–46.0)
Hemoglobin: 11.2 g/dL — ABNORMAL LOW (ref 12.0–15.0)

## 2011-03-21 MED ORDER — IOHEXOL 300 MG/ML  SOLN
50.0000 mL | Freq: Once | INTRAMUSCULAR | Status: AC | PRN
  Administered 2011-03-21: 50 mL via ORAL

## 2011-03-21 NOTE — Op Note (Signed)
NAME:  Katelyn Bailey, Katelyn Bailey NO.:  [id number]  MEDICAL RECORD NO.:  [id number]  LOCATION:  1534                         FACILITY:  [facility]  PHYSICIAN:  Sandria Bales. Ezzard Standing, M.D.  DATE OF BIRTH:  31-Jan-1966  DATE OF PROCEDURE:  20 Mar 2011                              OPERATIVE REPORT  PREOPERATIVE DIAGNOSIS:  Morbid obesity.  Weight 196, BMI 37.1.  POSTOPERATIVE DIAGNOSIS:  Morbid obesity.  Weight 196, BMI 37.1.  PROCEDURE:  Laparoscopic Roux-en-Y gastric bypass with upper endoscopy.  SURGEON:  Sandria Bales. Ezzard Standing, M.D.  FIRST ASSISTANT:  Sharlet Salina T. Hoxworth, M.D.  ANESTHESIA:  General endotracheal with approximately 20 cc of Exparel.  COMPLICATIONS:  None.  INDICATIONS OF PROCEDURE:  Ms. Kessen is a 45 year old white female, who sees Dr. Alroy Dust who is a primary medical doctor.  She is interested in morbid obesity surgery, who had been through our preoperative program.  She has actually lost over 20 pounds since I initially saw her in February and get a ready for the surgery.  I discussed with her the indications, the potential complications of Roux-en-Y gastric bypass surgery.  The potential complications include, but are not limited to, bleeding, infection, leak from the bile, deep venous thrombosis, and long-term nutritional consequences.  The patient understands these indications and risks.  OPERATIVE NOTE:  Patient placed in a supine position, underwent a general endotracheal anesthetic, and room number was supervised Dr. Eilene Ghazi.  She had 2 g of cefoxitin at initiation of procedure.    A time-out was held and surgical checklist run.  Her abdomen was prepped with ChloraPrep.  She was sterilely draped.  I accessed her abdominal cavity through a left upper quadrant Optiview 12 mm Ethicon trocar.  Upon entering the abdominal cavity, did abdominal exploration.  Right and left lobes of liver were unremarkable.  Stomach was unremarkable.  The bowel that  I could see was unremarkable.  I then placed in 6 additional trocars, a 5 mm subxiphoid trocar, a 5 mm left lateral trocar, a 10 or 11 mm trocar below to the right of the umbilicus, then a right upper paramedian 12 mm trocar, right subcostal trocar, and a left paramedian trocar.  The first was to identify the small intestines and ran the jejunum from the ligament of Treitz to about 40 cm.  I divided the small bowel with a white load of the Ethicon Endo-GIA stapler.  After dividing the small bowel mesentery down, I then counted 100 cm for the future gastric limb of the jejunum.  I then did a side-to-side jejunojejunal anastomosis using a white load of the 45 mm Ethicon Endo- GIA stapler.  I closed enterotomy with two running 2-0 Vicryl sutures. I used 2 buttressing sutures in addition to this and placed to seal over this.  I closed the mesenteric defect with a running 2-0 silk suture.  I then divided the omentum up to the ligament of Treitz.  I then went up and put the patient in reverse Trendelenburg.  I put the [facility] liver retractor in the left lobe of the liver.  Identified the angle of His, identified the lesser curvature of stomach, and I  got around the stomach behind into the lesser sac.  I divided the stomach first with a 45 mm blue load Ethicon stapler.  I did 2 firings of the Echelon 60 mm stapler with a blue load, then I did a final 45 mm Ethicon stapler.  This created a gastric pouch about 3.5 cm.  I made sure the esophagogastric junction was patent by passing an Ewald tube down into the pouch.  I oversewn the gastric remnant with a locking 2-0 Vicryl suture.  I brought the jejunal limb up antecolic antegastric and did a side-to-side anastomosis between the stomach and jejunum.  I then did an enterotomy between the stomach and small bowel with a blue load of the 45 mm Ethicon Endo-GIA stapler.  I then closed this enterotomy with two running 2-0 Vicryl sutures.  I tried  to make the enterotomy about 2.5 cm.  I then did an anterior suture of 2-0 Vicryl suture for 2-layer closure.  I then went down and found Peterson defect, closed this between the mesentery of the transverse colon and mesentery of the jejunal limb with a 2-0 silk.  At this point, Dr. Johna Sheriff broke scrub and did upper endoscopy. I clamped off the small bowel.  There was no air leak.  She had a viable gastric lining with about a 4 to 5 cm gastric pouch noted.  I then placed Tisseel over the gastrojejunal anastomosis, the cut edge of the jejunum, and the greater curvature of the new pouch.  I then infiltrated all the wounds with Exparel using 20 cc mixed with 10 cc of saline.  I then closed the skin with interrupted running 3- 0 Vicryl sutures, painted the wounds with Dermabond, sterilely dressed, and the patient was then transported to the recovery room in good condition.  Sponge and needle counts were correct.   Sandria Bales. Ezzard Standing, M.D., FACS    DHN/MEDQ  D:  03/20/2011  T:  03/21/2011  Job:  [id number]  cc:   Lonzo Cloud. Kriste Basque, MD [address] [place] [address]  Vilinda Flake, Ph.D. Fax: [phone]  Electronically Signed by Ovidio Kin M.D. on 03/21/2011 02:26:43 PM

## 2011-03-22 LAB — DIFFERENTIAL
Basophils Absolute: 0 10*3/uL (ref 0.0–0.1)
Basophils Relative: 0 % (ref 0–1)
Eosinophils Absolute: 0.1 10*3/uL (ref 0.0–0.7)
Eosinophils Relative: 1 % (ref 0–5)
Lymphocytes Relative: 32 % (ref 12–46)
Lymphs Abs: 2.2 10*3/uL (ref 0.7–4.0)
Monocytes Absolute: 0.6 10*3/uL (ref 0.1–1.0)
Monocytes Relative: 8 % (ref 3–12)
Neutro Abs: 4.1 10*3/uL (ref 1.7–7.7)
Neutrophils Relative %: 59 % (ref 43–77)

## 2011-03-22 LAB — CBC
HCT: 34.3 % — ABNORMAL LOW (ref 36.0–46.0)
Hemoglobin: 11 g/dL — ABNORMAL LOW (ref 12.0–15.0)
MCH: 28.3 pg (ref 26.0–34.0)
MCHC: 32.1 g/dL (ref 30.0–36.0)
MCV: 88.2 fL (ref 78.0–100.0)
Platelets: 103 10*3/uL — ABNORMAL LOW (ref 150–400)
RBC: 3.89 MIL/uL (ref 3.87–5.11)
RDW: 15.1 % (ref 11.5–15.5)
WBC: 7 10*3/uL (ref 4.0–10.5)

## 2011-03-23 ENCOUNTER — Telehealth (INDEPENDENT_AMBULATORY_CARE_PROVIDER_SITE_OTHER): Payer: Self-pay

## 2011-03-23 NOTE — Discharge Summary (Signed)
  NAME:  Katelyn Bailey, Katelyn Bailey NO.:  [id number]  MEDICAL RECORD NO.:  [id number]  LOCATION:  1534                         FACILITY:  [facility]  PHYSICIAN:  Sandria Bales. Ezzard Standing, M.D.  DATE OF BIRTH:  [birth year]  DATE OF ADMISSION:  03/20/2011 DATE OF DISCHARGE:  03/22/2011                              DISCHARGE SUMMARY   DISCHARGE DIAGNOSES: 1. Morbid obesity (weight 196, BMI of 37.1). 2. Gastroesophageal reflux disease. 3. Hypertension. 4. Hypercholesterolemia. 5. History of mild anxiety.  OPERATIONS PERFORMED:  The patient underwent laparoscopic Roux-en-Y gastric bypass with upper endoscopy on the March 20, 2011.  HISTORY OF ILLNESS:  [name] is a 45 year old white female who is a patient of Dr. Alroy Dust who has been morbidly obese much of her adult life.  She has been through our preoperative bariatric program.  She was on a supervised diet by Dr. Kriste Basque and lost over 20 pounds in getting ready for the surgery.  She was interested in Roux-en-Y gastric bypass for control of her weight.  HOSPITAL COURSE:  The patient was taken to the operating room on the day of admission where she underwent a laparoscopic Roux-en-Y gastric bypass by Dr. Ovidio Kin with upper endoscopy.  Postoperatively, she has done well.  On her first postoperative day, her hemoglobin was 11, white blood count was 9100.  She had Dopplers of the lower extremities, which showed no evidence of DVT.  She had a Gastrografin swallow, which showed no evidence of leakage.    She was started on water.  She is now 2 days postop.  She has some bloated feeling early, which has now resolved.  Her hemoglobin today is 11, white blood count 7000, and she is ready for discharge.  She will be discharged to home.  DISCHARGE INSTRUCTIONS:   No driving for 2-4 days and she is going to be out to work at least 2-3 weeks and will play a little bit out here to see how she is doing.   She is still on postop  Roux-en-Y gastric bypass diet.  She can start showering tomorrow.   She has an appointment to see me in about 2-3 weeks having urge to see Dr. Kriste Basque within 2-4 weeks for followup and she has an appointment with a dietitian.   She will be given Roxicet elixir as her discharge pain medicine.   She will resume her home medicines, which include: 1. Alprazolam 0.5 mg. 2. Pantoprazole 40 mg daily. 3. Simvastatin 40 mg daily. 4. Lisinopril/hydrochlorothiazide.  I will her hold her enteric-coated     aspirin for right now.  DISCHARGE CONDITION:  Good and she should see me back again in about 2-3 weeks.   Sandria Bales. Ezzard Standing, M.D., FACS   DHN/MEDQ  D:  03/22/2011  T:  03/22/2011  Job:  [id number]  cc:   Lonzo Cloud. Kriste Basque, MD [address] [place] [address]  Vilinda Flake, Ph.D. Fax: [phone]  Electronically Signed by Ovidio Kin M.D. on 03/23/2011 11:08:28 AM

## 2011-03-23 NOTE — Telephone Encounter (Signed)
I called pt to check on her after surgery.  She states she is doing well.  I confirmed her po appt on 9-5

## 2011-03-27 NOTE — Op Note (Signed)
  NAME:  MARINA, BOERNER NO.:  [id number]  MEDICAL RECORD NO.:  [id number]  LOCATION:  1534                         FACILITY:  [facility]  PHYSICIAN:  Sharlet Salina T. Edelin Fryer, M.D.DATE OF BIRTH:  1965/09/18  DATE OF PROCEDURE:  03/20/2011 DATE OF DISCHARGE:                              OPERATIVE REPORT   PROCEDURE:  Upper GI endoscopy.  DESCRIPTION OF PROCEDURE:  Upper GI endoscopy was performed intraoperatively at the completion of laparoscopic Roux-en-Y gastric bypass by Dr. Ezzard Standing.  The Olympus video endoscope was inserted into the upper esophagus without difficulty and then passed under direct vision to the EG junction at about 38 cm.  The small gastric pouch was entered and then tensely distended with air while the outlet was clamped by Dr. Ezzard Standing under saline irrigation.  There was no evidence of leakage.  The staple and suture lines and the mucosa of the pouch appeared normal and there was no bleeding.  The pouch measured about 4 cm in length.  The anastomosis was visualized and was patent.  The pouch was then desufflated and the scope withdrawn.     Lorne Skeens. Faydra Korman, M.D.     Tory Emerald  D:  03/20/2011  T:  03/21/2011  Job:  [id number]  Electronically Signed by Glenna Fellows M.D. on 03/27/2011 11:24:54 AM

## 2011-03-30 ENCOUNTER — Encounter (INDEPENDENT_AMBULATORY_CARE_PROVIDER_SITE_OTHER): Payer: Self-pay

## 2011-04-04 ENCOUNTER — Encounter: Payer: BC Managed Care – PPO | Attending: Surgery

## 2011-04-04 DIAGNOSIS — Z09 Encounter for follow-up examination after completed treatment for conditions other than malignant neoplasm: Secondary | ICD-10-CM | POA: Insufficient documentation

## 2011-04-04 DIAGNOSIS — Z713 Dietary counseling and surveillance: Secondary | ICD-10-CM | POA: Insufficient documentation

## 2011-04-04 DIAGNOSIS — Z9884 Bariatric surgery status: Secondary | ICD-10-CM | POA: Insufficient documentation

## 2011-04-04 NOTE — Progress Notes (Signed)
  2 Week Post-Operative Nutrition Class  Patient was seen on 04/04/2011 for Post-Operative Nutrition education at the [facility].   Surgery date: 03/20/11 Surgery type: Gastric Bypass  Weight today: 182lbs Weight change: 16.8lbs Total weight lost: 31 lbs BMI: 34.5  The following the learning objective met the patient during this course:   Identifies Phase 3A (Soft, High Proteins) Dietary Goals and will begin from 2 weeks post-operatively to 2 months post-operatively   Identifies appropriate sources of fluids and proteins   States protein recommendations and appropriate sources post-operatively  Identifies the need for appropriate texture modifications, mastication, and bite sizes when consuming solids  Identifies appropriate multivitamin and calcium sources post-operatively  Describes the need for physical activity post-operatively and will follow MD recommendations  States when to call healthcare provider regarding medication questions or post-operative complications  Handouts given during class include:  Phase 3A: Soft, High Protein Diet Handout  Band Fill Guidelines Handout  Follow-Up Plan: Patient will follow-up at [facility] in 6 weeks for 2 months post-op nutrition visit for diet advancement per MD.

## 2011-04-04 NOTE — Patient Instructions (Signed)
Patient to follow Phase 3A-Soft, High Protein Diet and follow-up at [facility] in 6 weeks for 2 months post-op nutrition visit for diet advancement. 

## 2011-04-05 ENCOUNTER — Ambulatory Visit (INDEPENDENT_AMBULATORY_CARE_PROVIDER_SITE_OTHER): Payer: BC Managed Care – PPO | Admitting: Pulmonary Disease

## 2011-04-05 ENCOUNTER — Encounter: Payer: Self-pay | Admitting: Pulmonary Disease

## 2011-04-05 ENCOUNTER — Ambulatory Visit (INDEPENDENT_AMBULATORY_CARE_PROVIDER_SITE_OTHER): Payer: BC Managed Care – PPO | Admitting: Surgery

## 2011-04-05 VITALS — BP 124/84 | HR 73 | Temp 97.4°F | Ht 61.0 in | Wt 183.4 lb

## 2011-04-05 VITALS — BP 118/88 | HR 60 | Ht 61.0 in | Wt 181.2 lb

## 2011-04-05 DIAGNOSIS — F411 Generalized anxiety disorder: Secondary | ICD-10-CM

## 2011-04-05 DIAGNOSIS — K449 Diaphragmatic hernia without obstruction or gangrene: Secondary | ICD-10-CM

## 2011-04-05 DIAGNOSIS — I10 Essential (primary) hypertension: Secondary | ICD-10-CM

## 2011-04-05 DIAGNOSIS — E7800 Pure hypercholesterolemia, unspecified: Secondary | ICD-10-CM

## 2011-04-05 DIAGNOSIS — Z9884 Bariatric surgery status: Secondary | ICD-10-CM

## 2011-04-05 DIAGNOSIS — I872 Venous insufficiency (chronic) (peripheral): Secondary | ICD-10-CM

## 2011-04-05 DIAGNOSIS — Z09 Encounter for follow-up examination after completed treatment for conditions other than malignant neoplasm: Secondary | ICD-10-CM

## 2011-04-05 DIAGNOSIS — E663 Overweight: Secondary | ICD-10-CM

## 2011-04-05 NOTE — Patient Instructions (Signed)
Today we updated your med list in EPIC>    We decided to STOP the SIMVASTATIN & recheck your Lipid Profile in about 3 months...    We also decided to keep the Lisinopril/HCT at 1/2 tab daily & monitor your BP at home (let me know if BP drops as your weight comes down further).    Finally we decided to slowly wean down the Protonix to every other day etc to assess the need for this medication...  Call for any problems...  Let's plan a brief fiollow up in 3 months.Marland KitchenMarland Kitchen

## 2011-04-05 NOTE — Patient Instructions (Signed)
You're doing well.  Will check lab work on you in 3 months.

## 2011-04-05 NOTE — Progress Notes (Signed)
Subjective:    Patient ID: Katelyn Bailey, female    DOB: March 18, 1966, 45 y.o.   MRN: [id number]  HPI 45 y/o WF here for a follow up visit... she has mult med problems including:  HBP, Ven Insuffic;  Hyperchol;  Obesity;  HH/ GERD; and Anxiety...   ~  April 28, 2010:  she is considering Bariatric Surg approach to her obesity, but notes that her [employer] of [place] requires her to have 6 monthly appts w/ her primary addressing her wt, diet, exercise program, and lifestyle mod efforts before they will consider the surgical approach... she has already been to intake sessions at [facility] & in [place]> insurance requires a Programmer, systems...  ~  August 30, 2010:  Here for CPX & for 1st of the 6 required monthly assessments before bariatric surg will be considered by her insurance company ([employer] [place]) >    BP well controlled on med;  Chol looks good on Simva40;  reflux symptoms resolved w/ PPI;  she is due for f/u by GYN DrSilva w/ PAP, Mammogram, etc...    MEDICALLY SUPERVISED WEIGHT LOSS DOCUMENTATION:         Patient's current weight=224#,  61"tall,  BMI=42         Patient's Diet= low sodium, low carb, low fat         Exercise regimen= walking daily         Behavioral changes= smaller portions, NPO after dinner  ~  We performed MONTHLY visits thru 01/27/11 as required by her insurance company >> see prev EMR notes for details...  ~  January 27, 2011:  Here for her 6th installment of her medically supervised weight loss program:    MEDICALLY SUPERVISED WEIGHT LOSS DOCUMENTATION:         Patient's current weight=202#,  61" tall,  BMI=38         Patient's Diet= low sodium, low carb, low fat         Exercise regimen= walking daily outdoors & on treadmill> rarely misses a day!         Behavioral changes= smaller portions & smaller plate, NPO after dinner, eating out less & reading labels> she has been practicing eating portions required of pts after bariatric surg...    LABS today showed FLP  great on Simva40;  Chems WNL;  CBC OK w/ Hg= 12.2 (add MVI w/ Fe);  TSH WNL.Marland Kitchen.  ~  April 05, 2011:  22mo ROV & she is now several weeks post-op for her Bariatric Surg w/ Laparoscopic Roux-en-Y gastric bypass by [facility] & upper endoscopy by DrHoxsworth> excellent result & she has slowly advanced diet post op from liqs to soft foods, sm portions, etc;  She has last 19# more over the last 2 months down to 183# today, and we discussed:    BP 124/84 on Lisinopril/ HCT 20-12.5 taking 1/2 tab daily; BP similar at home, feels well, etc; REC to keep same & monitor BP at home as weight continues to drop so may her BP & she could then stop the antihypertensive...    On Simva40 w/ good numbers 6/12; now weight down further & she is anxious to stop the med & see how the FLP looks on her new diet alone; f/u FLP 48mo...    On Protonix 40mg /d & she denies GERD symptoms, dysphagia, reflux, LER, etc;  OK to wean this med to Qod & off as long as she remains asymptomatic.Marland KitchenMarland Kitchen  Problem List:  ESSENTIAL HYPERTENSION (ICD-401.9) - controlled on LISINOPRIL/Hct 20-12.5> 1/2 tab daily... BP= 124/84 today & similar at home, & we discussed continuing this med at 1/2 tab daily & continue to monitor BP at home... denies HA, visual changes, CP, palipit, dizziness, syncope, dyspnea, edema, etc... ~  CXR 1/12 showed normal heart size, clear lungs... ~  EKG 1/12 showed NSR,  NSSTTWA...  VENOUS INSUFFICIENCY, LEGS (ICD-459.81) - follows a low sodium diet, & we discussed elevation & support hose to minimize any swelling...  HYPERCHOLESTEROLEMIA (ICD-272.0) - on SIMVASTATIN 40mg /d + low fat diet... ~  FLP 2/06 showed  TChol 227, HDL 42, LDL 182 & rec to start Simva 40 ~  FLP 2/08 on Simva40 showed TChol 174, HDL 35, LDL 111 ~  FLP 5/09 on Simva40 showed TChol 156, TG 59, HDL 37, LDL 107 ~  FLP 6/10 on Simva40 showed TChol 144, TG 49, HDL 46, LDL 88 ~  FLP 1/11 on Simva40 showed TChol 167, TG 74, HDL 46, LDL 106 ~  FLP 1/12  on Simva40 showed TChol 173, TG 96, HDL 46, LDL 108 ~  FLP 6/12 on Simva40 showed TChol 122, TG 57, HDL 40, LDL 70 ~  9/12:  s/p Bariatric Surg w/ Laparoscopic Roux-en-Y gastric bypass by [facility] 03/20/11, she wants to stop the Simva40 & f/u FLP in  3months.  OVERWEIGHT (ICD-278.02) - 200# in 2001; 207# in '02; 210# in '05; 219# in '07; & 211# 8/08... ~  weight 5/09 = down to 175# with all the stress (41 y/o son died suddenly 04-May-2023 ?cause) ~  weight 1/11 = 205#... on weight watchers now. ~  weight 9/11 = 222#... she is 5" tall, & BMI= 42. ~  weight 1/12 = 224#.Marland KitchenMarland Kitchen we reviewed low carb, low fat, low salt diet. 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 ACID REFLUX DISEASE (ICD-530.81) - 8/08 eval w/ LER, improved w/ PROTONIX 40/d... ~  9/12:  OK to try to wean the Protonix off, titrate to any symptoms...  DIZZINESS (ICD-780.4) - uses mecliz as needed.  ANXIETY (ICD-300.00) - on ALPRAZOLAM 0.5mg  Qhs Prn... ~  2023-05-04:  tragically her 69 y/o son died in his sleep 05-04-2023 and autopsy showed no apparent cause, he was healthy without medical problems... she lost 35 lbs w/ the stress... she is getting counselling thru hospice... she has a 33 y/o son at home.  Health Maintenance: ~  GI:  given stool cards to check for hidden blood; otherwise colonoscopy not due til after age 26... ~  GYN:  DrSilva- with PAP & Mammogram each spring... ~  Immunizations:  she gets the yearly Flu vaccines each autumn;  ?when last Tetanus shot was given & she will check her records;  she will not require a Pneumonia vaccine until age 82, unless the current recommendations change...   Past Surgical History  Procedure Date  . Tubal ligation 1997  . Cesarean section  1990  . Roux-en-y gastric bypass 03/20/2011    Outpatient Encounter Prescriptions as of 04/05/2011  Medication Sig Dispense Refill  . ALPRAZolam (XANAX) 0.5 MG tablet Take 1/2 to 1 tablet by mouth three times daily as needed  for nerves       . doxycycline (ORACEA) 40 MG capsule Take 40 mg by mouth every morning.        Marland Kitchen lisinopril-hydrochlorothiazide (PRINZIDE,ZESTORETIC) 20-12.5 MG per tablet Take as directed   ==> taking 1/2 tab daily    . medroxyPROGESTERone (DEPO-PROVERA) 150 MG/ML injection       . pantoprazole (PROTONIX) 40 MG tablet Take 40 mg by mouth daily. 30 minutes prior to dinner   ==> ok to wean down    . simvastatin (ZOCOR) 40 MG tablet Take 40 mg by mouth at bedtime.    ==> ok to STOP      No Known Allergies   Current Medications, Allergies, Past Medical History, Past Surgical History, Family History, and Social History were reviewed in [employer] record.    Review of Systems       See HPI - all other systems neg except as noted...  The patient denies anorexia, fever, weight loss, weight gain, vision loss, decreased hearing, hoarseness, chest pain, syncope, dyspnea on exertion, peripheral edema, prolonged cough, headaches, hemoptysis, abdominal pain, melena, hematochezia, severe indigestion/heartburn, hematuria, incontinence, muscle weakness, suspicious skin lesions, transient blindness, difficulty walking, depression, unusual weight change, abnormal bleeding, enlarged lymph nodes, and angioedema.     Objective:   Physical Exam    WD, overweight, 45 y/o WF in NAD.Marland KitchenMarland Kitchen 207#, 61" tall, BMI=39 GENERAL:  Alert & oriented; pleasant & cooperative. HEENT:  [place]/AT, EOM-full, PERRLA, EACs-clear, TMs-wnl, NOSE-clear, THROAT-clear & wnl. NECK:  Supple w/ full ROM; no JVD; normal carotid impulses w/o bruits; no thyromegaly or nodules palpated; no lymphadenopathy. CHEST:  Clear to P & A; without wheezes/ rales/ or rhonchi. HEART:  Regular Rhythm; without  murmurs/ rubs/ or gallops. ABDOMEN:  Soft & nontender, 5 sm incisions from Lap surg; normal bowel sounds; no organomegaly or masses detected. EXT: without deformities or arthritic changes; no varicose veins/ venous insuffic/ or edema.  DERM:  no lesions seen, no rashes, etc...   Assessment & Plan:   OBESITY>  She is now 2 weeks s/p Bariatric Surg w/ Laparoscopic Roux-en-Y gastric bypass by [facility] weight is down 19# over the last 26mo & she is very pleased & motivated;  Now on soft foods & doing well;  Continued f/u by [facility] & meds adjusted as below >>  HBP>  BP today is stable on the Lisinopril/HCT 1/2 tab daily & we discussed continuing this med regularly for now, monitor BP at home & be prepared to stop if BP too low or feeling weak, dizzy, etc...  CHOL>  She wants to stop the Simva40 now & recheck FLP in 3 months on her new diet...  GERD>  She will wean down the Protonix slowly to Qod & try to wean off over time titrating to symptoms...  Anxiety>  She is coping well w/ the stressors in her life.Marland KitchenMarland Kitchen

## 2011-04-05 NOTE — Progress Notes (Signed)
ASSESSMENT AND PLAN: 1. Morbid obesity.   She had a RY Gastric Bypass on Monday, 03/20/11.  She is doing well.  Will return to 1/2 time work on 04/10/11 and return to full time on 04/17/2011.  She was given a note to that effect.  She's had some mile trouble with constipation.  She will see me back in 3 months.  Plan to check labs in 3 months.  2. Gastroesophageal reflux disease. Better. 3. Hypertension for 10 years.  4. Hypercholesterolemia. On meds.  5. Mild anxiety.  6.  Continuous period.  Sees Dr. Wyvonnia Lora.  HISTORY OF PRESENT ILLNESS: Chief Complaint  Patient presents with  . Other    po RNY    Katelyn Bailey is a 45 y.o. (DOB: [birth year])  white female who is a patient of NADEL,SCOTT M, MD, MD and comes to me today for follow up of RYGB.  Her preop weight was Wt 196 lb 8 oz (89.132 kg)  and a BMI 37.13.  She has done well. She is advanced to high protein food and is tolerating this.  She has no abdominal complaints.  Her only problem is with her period.  She was having some trouble before surgery.  She sees Dr. Wyvonnia Lora from a gyn standpoint.  She's seeing Dr. Kriste Basque later today.  PHYSICAL EXAM: BP 118/88  Pulse 60  Ht 5\' 1"  (1.549 m)  Wt 181 lb 4 oz (82.214 kg)  BMI 34.25 kg/m2  General:  Looks good. HEENT: Normal. Pupils equal. Normal dentition. Neck: Supple. No thyroid mass. Lungs: Clear and symmetric. Heart:  RRR. No murmur. Abdomen:  Incisions well healed. No tenderness. No hernia. Normal bowel sounds.  No abdominal scars. Rectal: Not done. Extremities:  Good strength in upper and lower extremities. Neurologic:  Grossly intact to motor and sensory function.  DATA REVIEWED: None

## 2011-05-16 ENCOUNTER — Encounter: Payer: BC Managed Care – PPO | Attending: Surgery | Admitting: *Deleted

## 2011-05-16 ENCOUNTER — Encounter: Payer: Self-pay | Admitting: *Deleted

## 2011-05-16 DIAGNOSIS — Z713 Dietary counseling and surveillance: Secondary | ICD-10-CM | POA: Insufficient documentation

## 2011-05-16 DIAGNOSIS — Z01818 Encounter for other preprocedural examination: Secondary | ICD-10-CM | POA: Insufficient documentation

## 2011-05-16 DIAGNOSIS — Z9884 Bariatric surgery status: Secondary | ICD-10-CM

## 2011-05-16 NOTE — Progress Notes (Signed)
  Follow-up visit: 8 Weeks Post-Operative Gastric Bypass Surgery  Medical Nutrition Therapy:  Appt start time: 1600 end time:  1626.  Assessment:  Primary concerns today: post-operative bariatric surgery nutrition management.  Weight today: 167.7 lbs Weight change: 14.3 lbs Total weight lost: 45.3 lbs BMI: 31.8% Weight goal: 140 lbs  24-hr recall:  B (7-8 AM): Yogurt cup OR Low-fat cottage cheese Snk (9-10  AM): Not usually snacking OR 1 oz cheese   L (12-1 PM): Deli meat and cheese (3 oz) w/ 1/2 cup pinto beans Snk (3-4 PM): "Not hungry"  D (6 PM): Grilled shrimp (5) OR Crock pot chicken w/ 1/2 cup vegetable or beans Snk (8-9 PM): SF popsickles  Fluid intake: water, diet green tea = 50-60 oz Estimated total protein intake: 60-65g  Medications: See updated medications Supplementation: Taking regularly  Using straws: No Drinking while eating: No Hair loss: Slight hair loss Carbonated beverages: No N/V/D/C: Constipation, uses milk of mg for this Dumping syndrome: No  Recent physical activity:  Uses "[employer]" where she walks 15,000 steps per day. Walks 4-5 times per week for 45 minutes each day.  Progress Towards Goal(s):  In progress.  Handouts given during visit include:  Phase 3B - High Protein + Non-starchy Vegetable Diet  High Fiber Foods List   Nutritional Diagnosis:  NI-5.8.5 Inadeqate fiber intake As related to high protein diet.  As evidenced by frequent episodes of constipation and GI pain.    Intervention:  Nutrition education.  Monitoring/Evaluation:  Dietary intake, exercise, lap band fills, and body weight. Follow up in 1-2 months for 3-4 month post-op visit.

## 2011-05-16 NOTE — Patient Instructions (Addendum)
Goals:  Follow Phase 3B: High Protein + Non-Starchy Vegetables  Eat 3-6 small meals/snacks, every 3-5 hrs  Increase lean protein foods to meet 60-70g goal  Increase fluid intake to 64oz +  Add 15 grams of carbohydrate (fruit, whole grain, starchy vegetable) with meals (HIGH FIBER)  Avoid drinking 15 minutes before, during and 30 minutes after eating  Aim for >30 min of physical activity daily  Congrats on great weight loss! Keep up the good work :)

## 2011-06-28 ENCOUNTER — Encounter: Payer: BC Managed Care – PPO | Attending: Surgery | Admitting: *Deleted

## 2011-06-28 ENCOUNTER — Encounter: Payer: Self-pay | Admitting: *Deleted

## 2011-06-28 DIAGNOSIS — Z713 Dietary counseling and surveillance: Secondary | ICD-10-CM | POA: Insufficient documentation

## 2011-06-28 DIAGNOSIS — Z01818 Encounter for other preprocedural examination: Secondary | ICD-10-CM | POA: Insufficient documentation

## 2011-06-28 NOTE — Patient Instructions (Signed)
Goals:  Follow Phase 3B: High Protein + Non-Starchy Vegetables  Eat 3-6 small meals/snacks, every 3-5 hrs  Continue lean protein foods to meet 60-70g goal  Increase fluid intake to 64oz +  Add 15 grams of carbohydrate (fruit, whole grain, starchy vegetable) with meals (HIGH FIBER)  Avoid drinking 15 minutes before, during and 30 minutes after eating  Aim for >30 min of physical activity daily  Congrats on great weight loss! Keep up the good work :)

## 2011-06-28 NOTE — Progress Notes (Signed)
  Follow-up visit: 12 Weeks Post-Operative Gastric Bypass Surgery  Medical Nutrition Therapy:  Appt start time: 1630 end time:  1700.  Assessment:  Primary concerns today: post-operative bariatric surgery nutrition management. Katelyn Bailey seen for post-op Gastric Bypass nutrition  Weight today: 155 lbs Weight change: 12.7 lbs Total weight lost: 58 lbs BMI: 29.3% Weight goal: 140 lbs % Weight goal met: 79%  Surgery date: 03/20/11 Start weight at [facility]: 213 lbs  24-hr recall: B (7-8 AM): Yogurt cup OR Low-fat cottage cheese OR [employer] Snk (9-10 AM): Not usually snacking OR 1 oz cheese  L (12-1 PM): Deli meat and cheese (3 oz) w/ 1/2 cup pinto beans  Snk (3-4 PM): "Not hungry"  D (6 PM): Grilled shrimp (5) OR Crock pot chicken w/ 1/2 cup vegetable or beans  Snk (8-9 PM): SF popsicles  Fluid intake: 35-40 oz Estimated total protein intake: 60g  Medications: No changes to meds; See updated list Supplementation: Taking supplements regularly  Using straws: No Drinking while eating: No Hair loss: No Carbonated beverages: No N/V/D/C: Constipation reported likely due to dehydration Dumping syndrome: None reported  Recent physical activity:  30-45 minutes, 5 times/week  Progress Towards Goal(s):  In progress.   Nutritional Diagnosis:  Inadequate fluid intake As related to limited portions of fluid consumed.  As evidenced by pt meeting ~50% of estimated fluid needs and frequent complains of constipation.    Intervention:  Nutrition education.  Monitoring/Evaluation:  Dietary intake, exercise, protein intake, and body weight. Follow up in 3 months for 6 month post-op visit.

## 2011-07-02 ENCOUNTER — Other Ambulatory Visit: Payer: Self-pay | Admitting: Pulmonary Disease

## 2011-07-07 ENCOUNTER — Ambulatory Visit (INDEPENDENT_AMBULATORY_CARE_PROVIDER_SITE_OTHER): Payer: BC Managed Care – PPO | Admitting: Pulmonary Disease

## 2011-07-07 ENCOUNTER — Other Ambulatory Visit (INDEPENDENT_AMBULATORY_CARE_PROVIDER_SITE_OTHER): Payer: BC Managed Care – PPO

## 2011-07-07 ENCOUNTER — Encounter: Payer: Self-pay | Admitting: Pulmonary Disease

## 2011-07-07 DIAGNOSIS — I10 Essential (primary) hypertension: Secondary | ICD-10-CM

## 2011-07-07 DIAGNOSIS — E663 Overweight: Secondary | ICD-10-CM

## 2011-07-07 DIAGNOSIS — K449 Diaphragmatic hernia without obstruction or gangrene: Secondary | ICD-10-CM

## 2011-07-07 DIAGNOSIS — I872 Venous insufficiency (chronic) (peripheral): Secondary | ICD-10-CM

## 2011-07-07 DIAGNOSIS — E7800 Pure hypercholesterolemia, unspecified: Secondary | ICD-10-CM

## 2011-07-07 DIAGNOSIS — F411 Generalized anxiety disorder: Secondary | ICD-10-CM

## 2011-07-07 LAB — BASIC METABOLIC PANEL
BUN: 9 mg/dL (ref 6–23)
CO2: 24 mEq/L (ref 19–32)
Calcium: 8.9 mg/dL (ref 8.4–10.5)
Chloride: 111 mEq/L (ref 96–112)
Creatinine, Ser: 0.7 mg/dL (ref 0.4–1.2)
GFR: 101.9 mL/min (ref >60.00)
Glucose, Bld: 82 mg/dL (ref 70–99)
Potassium: 3.8 mEq/L (ref 3.5–5.1)
Sodium: 141 mEq/L (ref 135–145)

## 2011-07-07 LAB — LIPID PANEL
Cholesterol: 152 mg/dL (ref 0–200)
HDL: 39.8 mg/dL (ref >39.00)
LDL Cholesterol: 102 mg/dL — ABNORMAL HIGH (ref 0–99)
Total CHOL/HDL Ratio: 4
Triglycerides: 52 mg/dL (ref 0.0–149.0)
VLDL: 10.4 mg/dL (ref 0.0–40.0)

## 2011-07-07 LAB — HEPATIC FUNCTION PANEL
ALT: 13 U/L (ref 0–35)
AST: 13 U/L (ref 0–37)
Albumin: 3.7 g/dL (ref 3.5–5.2)
Alkaline Phosphatase: 57 U/L (ref 39–117)
Bilirubin, Direct: 0.1 mg/dL (ref 0.0–0.3)
Total Bilirubin: 0.5 mg/dL (ref 0.3–1.2)
Total Protein: 6.4 g/dL (ref 6.0–8.3)

## 2011-07-07 LAB — VITAMIN B12: Vitamin B-12: 685 pg/mL (ref 211–911)

## 2011-07-07 LAB — CBC WITH DIFFERENTIAL/PLATELET
Basophils Absolute: 0 10*3/uL (ref 0.0–0.1)
Basophils Relative: 0.5 % (ref 0.0–3.0)
Eosinophils Absolute: 0.1 10*3/uL (ref 0.0–0.7)
Eosinophils Relative: 0.8 % (ref 0.0–5.0)
HCT: 33.8 % — ABNORMAL LOW (ref 36.0–46.0)
Hemoglobin: 11.3 g/dL — ABNORMAL LOW (ref 12.0–15.0)
Lymphocytes Relative: 34.2 % (ref 12.0–46.0)
Lymphs Abs: 2.2 10*3/uL (ref 0.7–4.0)
MCHC: 33.3 g/dL (ref 30.0–36.0)
MCV: 86.4 fl (ref 78.0–100.0)
Monocytes Absolute: 0.4 10*3/uL (ref 0.1–1.0)
Monocytes Relative: 7.1 % (ref 3.0–12.0)
Neutro Abs: 3.6 10*3/uL (ref 1.4–7.7)
Neutrophils Relative %: 57.4 % (ref 43.0–77.0)
Platelets: 177 10*3/uL (ref 150.0–400.0)
RBC: 3.91 Mil/uL (ref 3.87–5.11)
RDW: 14.6 % (ref 11.5–14.6)
WBC: 6.3 10*3/uL (ref 4.5–10.5)

## 2011-07-07 LAB — FOLATE: Folate: >24.8 ng/mL (ref >5.9)

## 2011-07-07 LAB — IBC PANEL
Iron: 28 ug/dL — ABNORMAL LOW (ref 42–145)
Saturation Ratios: 7.3 % — ABNORMAL LOW (ref 20.0–50.0)
Transferrin: 273.3 mg/dL (ref 212.0–360.0)

## 2011-07-07 LAB — MAGNESIUM: Magnesium: 2.1 mg/dL (ref 1.5–2.5)

## 2011-07-07 NOTE — Patient Instructions (Signed)
Today we updated your med list in our EPIC system...    We decided to STOP your Lisinopril/ HCT blood pressure medication at this time...  Continue to monitor your BP at home & call for any questions...  Today we did your follow up fasting blood work...    Please call the PHONE TREE in a few days for your results...    Dial [id number] & when prompted enter your patient number followed by the # symbol...    Your patient number is:  [id number]#  Let's plan a follow up visit in 6 months.Marland KitchenMarland Kitchen

## 2011-07-07 NOTE — Progress Notes (Signed)
Subjective:    Patient ID: Katelyn Bailey, female    DOB: May 04, 1966, 45 y.o.   MRN: [id number]  HPI 45 y/o WF here for a follow up visit... she has mult med problems including:  HBP, Ven Insuffic;  Hyperchol;  Obesity;  HH/ GERD; and Anxiety...   ~  April 28, 2010:  she is considering Bariatric Surg approach to her obesity, but notes that her [employer] [place] requires her to have 6 monthly appts w/ her primary addressing her wt, diet, exercise program, and lifestyle mod efforts before they will consider the surgical approach... she has already been to intake sessions at [facility] & in [place]> insurance requires a Programmer, systems...  ~  August 30, 2010:  Here for CPX & for 1st of the 6 required monthly assessments before bariatric surg will be considered by her insurance company ([employer] [place]) >    BP well controlled on med;  Chol looks good on Simva40;  reflux symptoms resolved w/ PPI;  she is due for f/u by GYN DrSilva w/ PAP, Mammogram, etc...    MEDICALLY SUPERVISED WEIGHT LOSS DOCUMENTATION:         Patient's current weight=224#,  61"tall,  BMI=42         Patient's Diet= low sodium, low carb, low fat         Exercise regimen= walking daily         Behavioral changes= smaller portions, NPO after dinner  ~  We performed MONTHLY visits thru 01/27/11 as required by her insurance company >> see prev EMR notes for details...  ~  January 27, 2011:  Here for her 6th installment of her medically supervised weight loss program:    MEDICALLY SUPERVISED WEIGHT LOSS DOCUMENTATION:         Patient's current weight=202#,  61" tall,  BMI=38         Patient's Diet= low sodium, low carb, low fat         Exercise regimen= walking daily outdoors & on treadmill> rarely misses a day!         Behavioral changes= smaller portions & smaller plate, NPO after dinner, eating out less & reading labels> she has been practicing eating portions required of pts after bariatric surg...    LABS today showed FLP  great on Simva40;  Chems WNL;  CBC OK w/ Hg= 12.2 (add MVI w/ Fe);  TSH WNL.Marland Kitchen.  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    On Simva40 w/ good numbers 6/12; now weight down further & she is anxious to stop the med & see how the FLP looks on her new diet alone; f/u FLP 60mo...    On Protonix 40mg /d & she denies GERD symptoms, dysphagia, reflux, LER, etc;  OK to wean this med to Qod & off as long as she remains asymptomatic.Marland KitchenMarland Kitchen  ~  July 07, 2011:  64mo ROV & Katelyn Bailey is feeling well- no new complaints or concerns;  She has lost a further 27# to 156# today & she is very pleased;  BP has been running sl low on her LisinoprilHCT 1/2 tab daily & we discussed stopping this med now & following BP at home;  Already off statin Rx & FLP looks great now on diet alone;  Chems normal, CBC w/ mild anemia Hg=11.3/ Fe=28 (7%sat) and advised to start Fe 325mg  + VitC 500mg  daily (we will recheck lab & if no better she may need the Fe IV);  B12, Folate/ Mag are all ok...         Problem List:  ESSENTIAL HYPERTENSION (ICD-401.9) - controlled on LISINOPRIL/Hct 20-12.5> 1/2 tab daily... ~  CXR 1/12 showed normal heart size, clear lungs... ~  EKG 1/12 showed NSR,  NSSTTWA... ~  9/12:  BP= 124/84 today on 1/2 of the LisinoprilHCT & similar at home, & we discussed continuing same for now... denies HA, visual changes, CP, palipit, dizziness, syncope, dyspnea, edema, etc... ~  12/12:  BP= 118/62 &  even lower at home; she is anxious to stop the med & seen how she does monitoring BP at home..  VENOUS INSUFFICIENCY, LEGS (ICD-459.81) - follows a low sodium diet, & we discussed elevation & support hose to minimize any swelling...  HYPERCHOLESTEROLEMIA (ICD-272.0) - on SIMVASTATIN 40mg /d + low fat diet... 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 ~  FLP 12/12 on diet alone showed TChol 152, TG 52, HDL 40, LDL 102  OVERWEIGHT (ICD-278.02) - 200# in 2001; 207# in '02; 210# in '05; 219# in '07; & 211# 8/08... ~  weight 5/09 = down to 175# with all the stress (61 y/o son died suddenly 04/19/2023 ?cause) ~  weight 1/11 = 205#... on weight watchers now. ~  weight 9/11 = 222#... she is 100" tall, & BMI= 42. ~  weight 1/12 = 224#.Marland KitchenMarland Kitchen we reviewed low carb, low fat, low salt diet. 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 BMI= 29  HIATAL HERNIA WITH REFLUX (ICD-553.3) - Ba swallow 8/08 w/ sm HH, otherw neg... ACID REFLUX DISEASE (ICD-530.81) - 8/08 eval w/ LER, improved w/ PROTONIX 40/d... ~  UGI series post bypass surg> normal w/o anastomotic leak etc... ~  9/12:  OK to try to wean the Protonix off, titrate to any symptoms...  DIZZINESS (ICD-780.4) - uses mecliz  as needed.  ANXIETY (ICD-300.00) - on ALPRAZOLAM 0.5mg  Qhs  Prn... ~  17-Apr-2023:  tragically her 54 y/o son died in his sleep 04-17-23 and autopsy showed no apparent cause, he was healthy without medical problems... she lost 35 lbs w/ the stress... she is getting counselling thru hospice... she has a 31 y/o son at home.  ANEMIA >> started on Fe 325mg /d + VitC 500mg /d... ~  Labs 8/12 showed Hg= 11.0, /mcv= 88 ~  Labs 12/12 showed Hg= 11.3, MCV= 86, Fe= 28 (7%sat).. rec to start Fe+VitC...  Health Maintenance: ~  GI:  given stool cards to check for hidden blood; otherwise colonoscopy not due til after age 55... ~  GYN:  DrSilva- with PAP & Mammogram each spring... ~  Immunizations:  she gets the yearly Flu vaccines each autumn;  ?when last Tetanus shot was given & she will check her records;  she will not require a Pneumonia vaccine until age 74, unless the current recommendations change...   Past Surgical History  Procedure Date  . Tubal ligation 1997  . Cesarean section 1990  . Roux-en-y gastric bypass 03/20/2011    Outpatient Encounter Prescriptions as of 07/07/2011  Medication Sig Dispense Refill  . ALPRAZolam (XANAX) 0.5 MG tablet take 1/2 TO 1 tablet by mouth three times a day if needed for nerves  100 tablet  5  . calcium citrate-vitamin D 200-200 MG-UNIT TABS Take 2 tablets by mouth daily.       . cyanocobalamin 500 MCG tablet Take 500 mcg by mouth daily.        Marland Kitchen lisinopril-hydrochlorothiazide (PRINZIDE,ZESTORETIC) 20-12.5 MG per tablet Take 1/2 tablet by mouth once daily      . medroxyPROGESTERone (DEPO-PROVERA) 150 MG/ML injection       . Multiple Vitamins-Minerals (MULTIVITAMIN WITH MINERALS) tablet Take 2 tablets by mouth daily.        . pantoprazole (PROTONIX) 40 MG tablet Take 40 mg by mouth daily. 30 minutes prior to dinner  As directed      . DISCONTD: doxycycline (ORACEA) 40 MG capsule Take 40 mg by mouth every morning.        Marland Kitchen DISCONTD: doxycycline (VIBRAMYCIN) 100 MG capsule          No Known Allergies   Current Medications, Allergies, Past Medical History, Past Surgical History, Family History, and Social History were reviewed in [employer] record.    Review of Systems       See HPI - all other systems neg except as noted...  The patient notes steady wt reduction s/p gastic bypass surg 8/12;  She denies anorexia, fever, vision loss, decreased hearing, hoarseness, chest pain, syncope, dyspnea on exertion, peripheral edema, prolonged cough, headaches, hemoptysis, abdominal pain, melena, hematochezia, severe indigestion/heartburn, hematuria, incontinence, muscle weakness, suspicious skin lesions, transient blindness, difficulty walking, depression, abnormal bleeding, enlarged lymph nodes, and angioedema.     Objective:   Physical Exam    WD, overweight, 45 y/o WF in NAD.Marland KitchenMarland Kitchen 207#, 61" tall, BMI=39 GENERAL:  Alert & oriented; pleasant & cooperative. HEENT:  [place]/AT, EOM-full, PERRLA, EACs-clear, TMs-wnl, NOSE-clear, THROAT-clear & wnl. NECK:  Supple w/ full ROM; no JVD; normal carotid impulses w/o bruits; no thyromegaly or nodules palpated; no lymphadenopathy. CHEST:  Clear to P & A; without wheezes/ rales/ or rhonchi. HEART:  Regular Rhythm; without murmurs/ rubs/ or gallops. ABDOMEN:  Soft & nontender, 5 sm incisions from Lap surg; normal bowel sounds; no organomegaly or masses detected. EXT: without deformities or arthritic changes; no varicose veins/ venous insuffic/ or edema.  DERM:  no lesions seen, no rashes, etc...  RADIOLOGY DATA:  Reviewed in the EPIC EMR & discussed w/ the patient...  LABORATORY DATA:  Reviewed in the EPIC EMR & discussed w/ the patient...   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  CHOL>  FLP looks great off the Simva on diet alone...  GERD>  She is off the Protonix as well & denies abd pain, reflux, etc...  Anxiety>  She is coping well w/ the stressors in her life...   Patient's Medications  New Prescriptions   No medications on file  Previous Medications   ALPRAZOLAM (XANAX) 0.5 MG TABLET    take 1/2 TO 1 tablet by mouth three times a day if needed for nerves   CALCIUM CITRATE-VITAMIN D 200-200 MG-UNIT TABS    Take 2 tablets by mouth daily.    CYANOCOBALAMIN 500 MCG TABLET    Take 500 mcg by mouth daily.     FERROUS SULFATE (IRON) 325 (65 FE) MG TABS    Take 325 mg by mouth daily.     MEDROXYPROGESTERONE (DEPO-PROVERA) 150 MG/ML INJECTION       MULTIPLE VITAMINS-MINERALS (MULTIVITAMIN WITH MINERALS) TABLET    Take 2 tablets by mouth daily.    Modified Medications   No medications on file  Discontinued Medications   DOXYCYCLINE (ORACEA) 40 MG CAPSULE    Take 40 mg by mouth every morning.     DOXYCYCLINE (VIBRAMYCIN) 100 MG CAPSULE       LISINOPRIL-HYDROCHLOROTHIAZIDE (PRINZIDE,ZESTORETIC) 20-12.5 MG PER TABLET    Take 1/2 tablet by mouth once daily   PANTOPRAZOLE (PROTONIX) 40 MG TABLET    Take 40 mg by mouth daily. 30 minutes prior to dinner  As directed

## 2011-07-10 ENCOUNTER — Telehealth: Payer: Self-pay | Admitting: *Deleted

## 2011-07-10 DIAGNOSIS — D649 Anemia, unspecified: Secondary | ICD-10-CM

## 2011-07-10 NOTE — Telephone Encounter (Signed)
lmomtcb to discuss lab results with pt.

## 2011-07-11 NOTE — Telephone Encounter (Signed)
Left message with family member for pt to call back for results.

## 2011-07-12 NOTE — Telephone Encounter (Signed)
Patient returning call.  [phone]

## 2011-07-12 NOTE — Telephone Encounter (Signed)
Called and spoke with pt and she is aware of lab results per SN.  Pt is aware to come to the lab in early feb 2013 for recheck of labs.  See results note.

## 2011-07-19 ENCOUNTER — Ambulatory Visit (INDEPENDENT_AMBULATORY_CARE_PROVIDER_SITE_OTHER): Payer: BC Managed Care – PPO | Admitting: Surgery

## 2011-07-19 ENCOUNTER — Encounter (INDEPENDENT_AMBULATORY_CARE_PROVIDER_SITE_OTHER): Payer: Self-pay | Admitting: Surgery

## 2011-07-19 VITALS — BP 134/82 | HR 68 | Temp 97.8°F | Resp 16 | Ht 61.0 in | Wt 152.4 lb

## 2011-07-19 DIAGNOSIS — K912 Postsurgical malabsorption, not elsewhere classified: Secondary | ICD-10-CM

## 2011-07-19 DIAGNOSIS — Z09 Encounter for follow-up examination after completed treatment for conditions other than malignant neoplasm: Secondary | ICD-10-CM

## 2011-07-19 DIAGNOSIS — Z9884 Bariatric surgery status: Secondary | ICD-10-CM

## 2011-07-19 NOTE — Patient Instructions (Signed)
1.  You are doing very well.  2.  See Dr. Ezzard Standing back in 3 months.

## 2011-07-19 NOTE — Progress Notes (Signed)
ASSESSMENT AND PLAN: 1. Morbid obesity.  Initial weight-196, BMI-37.12  She had a RY Gastric Bypass on 03/20/11.  She is doing well.  She has lost about 40 pounds.  Dr. Kriste Basque checked labs 07/07/11 and these were okay except low Fe. Dr. Kriste Basque started her on Fe.  I gave the patient a copy of her labs.  She will see me back in 3 months.  Plan to check labs in 3 months.  2. Gastroesophageal reflux disease. Better. 3. Hypertension for 10 years.  4. Hypercholesterolemia. On meds.  5. Mild anxiety.  6. Continuous period.  Saw Dr. Wyvonnia Lora, who is leaving.  She is now seeing Dr. Tenny Craw.  Still having issues. 7.  Mild Fe anemia - on supplemental Fe.  HISTORY OF PRESENT ILLNESS: Chief Complaint  Patient presents with  . Follow-up    Recheck RNY 03/20/11    Katelyn Bailey is a 45 y.o. (DOB: May 07, 1966)  white female who is a patient of NADEL,SCOTT M, MD, MD and comes to me today for follow up of RYGB.  Her preop weight was Wt 196 lb 8 oz (89.132 kg)  and a BMI 37.13.  She has done well. She is advanced to high protein food and is tolerating this.  She has no abdominal complaints.  Her only problem is with her period.  She was having some trouble before surgery.  She sees Dr. Wyvonnia Lora from a gyn standpoint.  She's seeing Dr. Kriste Basque later today.  PHYSICAL EXAM: BP 134/82  Pulse 68  Temp(Src) 97.8 F (36.6 C) (Temporal)  Resp 16  Ht 5\' 1"  (1.549 m)  Wt 152 lb 6.4 oz (69.128 kg)  BMI 28.80 kg/m2  General:  Looks good. HEENT: Normal. Pupils equal. Normal dentition. Neck: Supple. No thyroid mass. Lungs: Clear and symmetric. Heart:  RRR. No murmur. Abdomen:  Incisions well healed. No tenderness. No hernia. Normal bowel sounds.  Incisions look good. Extremities:  Good strength in upper and lower extremities. Neurologic:  Grossly intact to motor and sensory function.  DATA REVIEWED: Labs drawn by Dr. Kriste Basque - mild anemia - Hgb - 11.3 (note, this predates her RYGB)  Fe low at 28

## 2011-07-27 ENCOUNTER — Encounter: Payer: Self-pay | Admitting: Pulmonary Disease

## 2011-08-22 ENCOUNTER — Telehealth: Payer: Self-pay | Admitting: Pulmonary Disease

## 2011-08-22 DIAGNOSIS — Z9884 Bariatric surgery status: Secondary | ICD-10-CM

## 2011-08-22 DIAGNOSIS — E7800 Pure hypercholesterolemia, unspecified: Secondary | ICD-10-CM

## 2011-08-22 DIAGNOSIS — I10 Essential (primary) hypertension: Secondary | ICD-10-CM

## 2011-08-22 DIAGNOSIS — E663 Overweight: Secondary | ICD-10-CM

## 2011-08-22 NOTE — Telephone Encounter (Signed)
I spoke with pt and she states she needs additional labs added to the one's she will have done in feb. She did not remember the labs that Dr. Ezzard Standing wanted order and states she will call back tomorrow to give the names of the labs. Will await ehr call back tomorrow

## 2011-08-23 NOTE — Telephone Encounter (Signed)
Pt is aware the labs where added and nothing further was needed

## 2011-08-23 NOTE — Telephone Encounter (Signed)
I spoke with the pt and she is set to have a cbc w/ diff and iron checked by Dr. Kriste Basque in Feb. She states her surgeon Dr Ezzard Standing wants her to have some additional labs as well in Feb. And the pt wants to know can we order these under Dr. Jodelle Green name so she can get them drawn all at once. The additional labs are as follows: CMP, Lipid Panel, Folate, Magnesium, Ferritin, and Vitamin B-12. Please advise if ok to order.Katelyn Bailey, CMA

## 2011-08-23 NOTE — Telephone Encounter (Signed)
Per SN----ok to add these labs .   These orders have already been placed for the pt .  thanks

## 2011-09-11 ENCOUNTER — Ambulatory Visit (INDEPENDENT_AMBULATORY_CARE_PROVIDER_SITE_OTHER): Payer: BC Managed Care – PPO | Admitting: Pulmonary Disease

## 2011-09-11 ENCOUNTER — Encounter: Payer: Self-pay | Admitting: Pulmonary Disease

## 2011-09-11 ENCOUNTER — Other Ambulatory Visit: Payer: Self-pay | Admitting: Pulmonary Disease

## 2011-09-11 ENCOUNTER — Other Ambulatory Visit (INDEPENDENT_AMBULATORY_CARE_PROVIDER_SITE_OTHER): Payer: BC Managed Care – PPO

## 2011-09-11 VITALS — BP 122/84 | HR 62 | Temp 97.2°F | Ht 61.0 in | Wt 142.6 lb

## 2011-09-11 DIAGNOSIS — E663 Overweight: Secondary | ICD-10-CM

## 2011-09-11 DIAGNOSIS — E7800 Pure hypercholesterolemia, unspecified: Secondary | ICD-10-CM

## 2011-09-11 DIAGNOSIS — K219 Gastro-esophageal reflux disease without esophagitis: Secondary | ICD-10-CM

## 2011-09-11 DIAGNOSIS — I10 Essential (primary) hypertension: Secondary | ICD-10-CM

## 2011-09-11 DIAGNOSIS — M531 Cervicobrachial syndrome: Secondary | ICD-10-CM

## 2011-09-11 DIAGNOSIS — M5481 Occipital neuralgia: Secondary | ICD-10-CM

## 2011-09-11 DIAGNOSIS — Z9884 Bariatric surgery status: Secondary | ICD-10-CM

## 2011-09-11 DIAGNOSIS — F411 Generalized anxiety disorder: Secondary | ICD-10-CM

## 2011-09-11 DIAGNOSIS — I872 Venous insufficiency (chronic) (peripheral): Secondary | ICD-10-CM

## 2011-09-11 DIAGNOSIS — D649 Anemia, unspecified: Secondary | ICD-10-CM

## 2011-09-11 LAB — CBC WITH DIFFERENTIAL/PLATELET
Basophils Absolute: 0 10*3/uL (ref 0.0–0.1)
Basophils Relative: 0.5 % (ref 0.0–3.0)
Eosinophils Absolute: 0.1 10*3/uL (ref 0.0–0.7)
Eosinophils Relative: 1 % (ref 0.0–5.0)
HCT: 37.3 % (ref 36.0–46.0)
Hemoglobin: 12.4 g/dL (ref 12.0–15.0)
Lymphocytes Relative: 33.6 % (ref 12.0–46.0)
Lymphs Abs: 1.9 10*3/uL (ref 0.7–4.0)
MCHC: 33.3 g/dL (ref 30.0–36.0)
MCV: 89.9 fl (ref 78.0–100.0)
Monocytes Absolute: 0.4 10*3/uL (ref 0.1–1.0)
Monocytes Relative: 7.1 % (ref 3.0–12.0)
Neutro Abs: 3.3 10*3/uL (ref 1.4–7.7)
Neutrophils Relative %: 57.8 % (ref 43.0–77.0)
Platelets: 166 10*3/uL (ref 150.0–400.0)
RBC: 4.15 Mil/uL (ref 3.87–5.11)
RDW: 16.8 % — ABNORMAL HIGH (ref 11.5–14.6)
WBC: 5.7 10*3/uL (ref 4.5–10.5)

## 2011-09-11 LAB — LIPID PANEL
Cholesterol: 152 mg/dL (ref 0–200)
HDL: 40 mg/dL (ref >39.00)
LDL Cholesterol: 103 mg/dL — ABNORMAL HIGH (ref 0–99)
Total CHOL/HDL Ratio: 4
Triglycerides: 47 mg/dL (ref 0.0–149.0)
VLDL: 9.4 mg/dL (ref 0.0–40.0)

## 2011-09-11 LAB — COMPREHENSIVE METABOLIC PANEL
ALT: 13 U/L (ref 0–35)
AST: 14 U/L (ref 0–37)
Albumin: 3.6 g/dL (ref 3.5–5.2)
Alkaline Phosphatase: 61 U/L (ref 39–117)
BUN: 12 mg/dL (ref 6–23)
CO2: 24 mEq/L (ref 19–32)
Calcium: 9.2 mg/dL (ref 8.4–10.5)
Chloride: 110 mEq/L (ref 96–112)
Creatinine, Ser: 0.8 mg/dL (ref 0.4–1.2)
GFR: 85.43 mL/min (ref >60.00)
Glucose, Bld: 83 mg/dL (ref 70–99)
Potassium: 3.9 mEq/L (ref 3.5–5.1)
Sodium: 140 mEq/L (ref 135–145)
Total Bilirubin: 0.5 mg/dL (ref 0.3–1.2)
Total Protein: 6.5 g/dL (ref 6.0–8.3)

## 2011-09-11 LAB — IBC PANEL
Iron: 63 ug/dL (ref 42–145)
Saturation Ratios: 17 % — ABNORMAL LOW (ref 20.0–50.0)
Transferrin: 265.3 mg/dL (ref 212.0–360.0)

## 2011-09-11 LAB — MAGNESIUM: Magnesium: 2 mg/dL (ref 1.5–2.5)

## 2011-09-11 LAB — FERRITIN: Ferritin: 5.1 ng/mL — ABNORMAL LOW (ref 10.0–291.0)

## 2011-09-11 LAB — IRON: Iron: 63 ug/dL (ref 42–145)

## 2011-09-11 LAB — VITAMIN B12: Vitamin B-12: 752 pg/mL (ref 211–911)

## 2011-09-11 MED ORDER — TOPIRAMATE 25 MG PO CPSP: ORAL_CAPSULE | ORAL | Status: DC

## 2011-09-11 NOTE — Patient Instructions (Signed)
Today we updated your med list in our EPIC system...    Continue your current medications the same...  We decided to treat the occipital pain w/ rest, heat/ice, & OTC analgesics as needed...    Start the TOPAMAX 25mg  one at bedtime for 1 week then increase to one twice a day...    We can go higher if needed for full response but I believe an injection from a headache specialist would be better...  Call if symptoms don't resolve on treatment for referral..Marland Kitchen

## 2011-09-11 NOTE — Progress Notes (Signed)
Subjective:    Patient ID: Katelyn Bailey, female    DOB: 11/13/1965, 46 y.o.   MRN: [id number]  HPI 46 y/o WF here for a follow up visit... she has mult med problems including:  HBP, Ven Insuffic;  Hyperchol;  Obesity;  HH/ GERD; and Anxiety...   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    On Simva40 w/ good numbers 6/12; now weight down further & she is anxious to stop the med & see how the FLP looks on her new diet alone; f/u FLP 56mo...    On Protonix 40mg /d & she denies GERD symptoms, dysphagia, reflux, LER, etc;  OK to wean this med to Qod & off as long as she remains asymptomatic...  ~  July 07, 2011:  56mo ROV & Katelyn Bailey is feeling well- no new complaints or concerns;  She has lost a further 27# to 156# today & she is very pleased;  BP has been running sl low on her LisinoprilHCT 1/2 tab daily & we discussed stopping this med now & following BP at home;  Already off statin Rx & FLP looks great now on diet alone;  Chems normal, CBC w/ mild anemia Hg=11.3/ Fe=28 (7%sat) and advised to start Fe 325mg  + VitC 500mg  daily (we will recheck lab & if no better she may need the Fe IV);  B12, Folate/ Mag are all ok...  ~  September 11, 2011:  35mo ROV & add-on for occipital area pain> c/o left neck/ occipital/ post head pain that comes & goes w/o known ppt cause; present for several weeks, occurs 2+ times daily, pain is severe rated 7-8/10 & lasts 10-66min 7 resolves spont (no  meds needed); denies visual symptoms, N/V, etc; she has never had similar pain;  Exam shows sl tender over left occiput, full ROM neck, no neuro findings, etc...  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    NOTE: she had f/u FASTING blood work today> FLP looks great, BS normal> all on diet alone; just dec Ferritin 5.1 & rec to continue FeSO4 daily for now...         Problem List:  ESSENTIAL HYPERTENSION (ICD-401.9) - controlled on LISINOPRIL/Hct 20-12.5> 1/2 tab daily... ~  CXR 1/12 showed normal heart size, clear lungs... ~  EKG 1/12 showed NSR,  NSSTTWA... ~  9/12:  BP= 124/84 today on 1/2 of the LisinoprilHCT & similar at home, & we discussed continuing same for now... denies HA, visual changes, CP, palipit, dizziness, syncope, dyspnea, edema, etc... ~  12/12:  BP= 118/62 & even lower at home; she is anxious  to stop the med & seen how she does monitoring BP at home.. ~  2/13:  BP= 122/84 off all meds...  VENOUS INSUFFICIENCY, LEGS (ICD-459.81) - follows a low sodium diet, & we discussed elevation & support hose to minimize any swelling...  HYPERCHOLESTEROLEMIA (ICD-272.0) - on SIMVASTATIN 40mg /d + low fat diet... 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 ~  FLP 12/12 on diet alone showed TChol 152, TG 52, HDL 40, LDL 102 ~  FLP 2/13 on diet alone showed TChol 152, TG 47, HDL 40, LDL 103... Ok on diet alone, off meds.  OVERWEIGHT (ICD-278.02) - 200# in 2001; 207# in '02; 210# in '05; 219# in '07; & 211# 8/08... ~  weight 5/09 = down to 175# with all the stress (66 y/o son died suddenly 04/10/23 ?cause) ~  weight 1/11 = 205#... on weight watchers now. ~  weight 9/11 = 222#... she is 39" tall, & BMI= 42. ~  weight 1/12 = 224#.Marland KitchenMarland Kitchen we reviewed low carb, low fat, low salt diet. 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 BMI= 29 ~  Weight 2/13 = 143#  HIATAL HERNIA WITH REFLUX (ICD-553.3) - Ba swallow 8/08 w/ sm HH, otherw neg... ACID REFLUX DISEASE (ICD-530.81) - 8/08 eval w/ LER, improved w/ PROTONIX 40/d... ~  UGI series post bypass surg> normal w/o anastomotic leak etc... ~  9/12:  OK to try to wean the Protonix off, titrate to any symptoms...  DIZZINESS (ICD-780.4) - uses mecliz as needed.  ANXIETY (ICD-300.00) - on ALPRAZOLAM 0.5mg  Qhs Prn... ~  04-10-23:  tragically her 54 y/o son died in his sleep Apr 10, 2023 and autopsy showed no apparent cause, he was healthy without medical problems... she lost 35 lbs w/ the stress... she is getting counselling thru hospice... she has a 50 y/o son at home.  ANEMIA >> started on Fe 325mg /d + VitC 500mg /d... ~  Labs 8/12 showed Hg= 11.0, /mcv= 88 ~  Labs 12/12 showed Hg= 11.3, MCV= 86, Fe= 28 (7%sat).. rec to start Fe+VitC... ~  Labs 2/13 on Fe daily showed Hg= 12.4, MCV= 90, Fe= 63 (17%), Ferritin= 5.1 (10-291), B12= 752  Health Maintenance: ~  GI:  given stool cards to check for hidden blood; otherwise colonoscopy not due til after age  53... ~  GYN:  DrSilva- with PAP & Mammogram each spring... ~  Immunizations:  she gets the yearly Flu vaccines each autumn;  ?when last Tetanus shot was given & she will check her  records;  she will not require a Pneumonia vaccine until age 20, unless the current recommendations change...   Past Surgical History  Procedure Date  . Tubal ligation 1997  . Cesarean section 1990  . Roux-en-y gastric bypass 03/20/2011    Outpatient Encounter Prescriptions as of 09/11/2011  Medication Sig Dispense Refill  . ALPRAZolam (XANAX) 0.5 MG tablet take 1/2 TO 1 tablet by mouth three times a day if needed for nerves  100 tablet  5  . calcium citrate-vitamin D 200-200 MG-UNIT TABS Take 2 tablets by mouth daily.       . cyanocobalamin 500 MCG tablet Take 500 mcg by mouth daily.        . Ferrous Sulfate (IRON) 325 (65 FE) MG TABS Take 325 mg by mouth daily.        . medroxyPROGESTERone (DEPO-PROVERA) 150 MG/ML injection       . Multiple Vitamins-Minerals (MULTIVITAMIN WITH MINERALS) tablet Take 2 tablets by mouth daily.        Marland Kitchen topiramate (TOPAMAX) 25 MG capsule Take one tablet by mouth at bedtime x 1 week then 1 po bid thereafter.  60 capsule  5    No Known Allergies   Current Medications, Allergies, Past Medical History, Past Surgical History, Family History, and Social History were reviewed in [employer] record.    Review of Systems       See HPI - all other systems neg except as noted...  The patient notes steady wt reduction s/p gastic bypass surg 8/12;  She denies anorexia, fever, vision loss, decreased hearing, hoarseness, chest pain, syncope, dyspnea on exertion, peripheral edema, prolonged cough, headaches, hemoptysis, abdominal pain, melena, hematochezia, severe indigestion/heartburn, hematuria, incontinence, muscle weakness, suspicious skin lesions, transient blindness, difficulty walking, depression, abnormal bleeding, enlarged lymph nodes, and angioedema.      Objective:   Physical Exam    WD, overweight, 46 y/o WF in NAD.Marland KitchenMarland Kitchen 207#, 61" tall, BMI=39 GENERAL:  Alert & oriented; pleasant & cooperative. HEENT:  [place]/AT, EOM-full, PERRLA, EACs-clear, TMs-wnl, NOSE-clear, THROAT-clear & wnl. NECK:  Supple w/ full ROM; no JVD; normal carotid impulses w/o bruits; no thyromegaly or nodules palpated; no lymphadenopathy. CHEST:  Clear to P & A; without wheezes/ rales/ or rhonchi. HEART:  Regular Rhythm; without murmurs/ rubs/ or gallops. ABDOMEN:  Soft & nontender, 5 sm incisions from Lap surg; normal bowel sounds; no organomegaly or masses detected. EXT: without deformities or arthritic changes; no varicose veins/ venous insuffic/ or edema.  DERM:  no lesions seen, no rashes, etc...  RADIOLOGY DATA:  Reviewed in the EPIC EMR & discussed w/ the patient...  LABORATORY DATA:  Reviewed in the EPIC EMR & discussed w/ the patient...   Assessment & Plan:   Occipital Neuralgia>  We discussed treatment trial w/ Topamax 25mg /d==>50mg /d to try & elim the HAs but will refer to HA clinic if not responding quickly...  HBP>  BP today is stable off all meds on diet alone...  CHOL>  FLP looks great off the Simva on diet alone...  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  GERD>  She is off the Protonix as well & denies abd pain, reflux, etc...  Anxiety>  She is coping well w/ the stressors in her life...  ANEMIA>  Hx mild anemia & eval revealed low Fe; supplemented orally & improved but Ferritin still low so we will continue the  FeSO4 daily.   Patient's Medications  New Prescriptions   TOPIRAMATE (TOPAMAX) 25 MG CAPSULE    Take one tablet by mouth at bedtime x 1 week then 1 po bid thereafter.  Previous Medications   ALPRAZOLAM (XANAX) 0.5 MG TABLET    take 1/2 TO 1 tablet by mouth three times a day if needed for nerves   CALCIUM CITRATE-VITAMIN D 200-200 MG-UNIT  TABS    Take 2 tablets by mouth daily.    CYANOCOBALAMIN 500 MCG TABLET    Take 500 mcg by mouth daily.     FERROUS SULFATE (IRON) 325 (65 FE) MG TABS    Take 325 mg by mouth daily.     MEDROXYPROGESTERONE (DEPO-PROVERA) 150 MG/ML INJECTION       MULTIPLE VITAMINS-MINERALS (MULTIVITAMIN WITH MINERALS) TABLET    Take 2 tablets by mouth daily.    Modified Medications   No medications on file  Discontinued Medications   No medications on file

## 2011-09-14 ENCOUNTER — Encounter (INDEPENDENT_AMBULATORY_CARE_PROVIDER_SITE_OTHER): Payer: Self-pay | Admitting: Surgery

## 2011-09-22 ENCOUNTER — Telehealth: Payer: Self-pay | Admitting: Pulmonary Disease

## 2011-09-22 DIAGNOSIS — R51 Headache: Secondary | ICD-10-CM

## 2011-09-22 NOTE — Telephone Encounter (Signed)
Pt returned call. Call her cell [phone]. Hazel Sams

## 2011-09-22 NOTE — Telephone Encounter (Signed)
Pt made aware that order was placed for referral to HA clinic and that the [facility] will call her with the date and time of the appt. Pt verbalized understanding.

## 2011-09-22 NOTE — Telephone Encounter (Signed)
Spoke with pt. She states would like to go ahead with referral to neurologist as SN had rec at last ov 09/11/11. She states that the HA's were better for a couple of days, but now they are back and seem to be lasting longer than before. She states that before, they would last approx 15 min, and now they last over an hour. She states tried taking topamax bid, but this made her too sleepy at work and so she is only taking 1 per day. Please advise, thanks!

## 2011-09-22 NOTE — Telephone Encounter (Signed)
Per SN refer to HA clinic and get her an apt ASAP. i have placed referral--lmomtcb x1 for pt to make aware

## 2011-09-26 ENCOUNTER — Encounter: Payer: BC Managed Care – PPO | Attending: Surgery | Admitting: *Deleted

## 2011-09-26 ENCOUNTER — Encounter: Payer: Self-pay | Admitting: *Deleted

## 2011-09-26 DIAGNOSIS — Z09 Encounter for follow-up examination after completed treatment for conditions other than malignant neoplasm: Secondary | ICD-10-CM | POA: Insufficient documentation

## 2011-09-26 DIAGNOSIS — Z713 Dietary counseling and surveillance: Secondary | ICD-10-CM | POA: Insufficient documentation

## 2011-09-26 DIAGNOSIS — Z9884 Bariatric surgery status: Secondary | ICD-10-CM | POA: Insufficient documentation

## 2011-09-26 NOTE — Progress Notes (Signed)
  Follow-up visit:  6 Months Post-Operative Gastric Bypass Surgery  Medical Nutrition Therapy:  Appt start time: 1715 end time:  1745.  Assessment:  Primary concerns today: Post-operative bariatric surgery nutrition management.  Katelyn Bailey returns for her 6 month post-op appointment and doing very well.  Has surpassed her wt loss goal of 140 lbs with a wt today of 137.7 lbs (with shoes). Reports no problems and taking all supplements as directed, including additional iron (325 mg) for low iron levels noted at recent MD appointment. Pt is very pleased with current wt.   Surgery date: 03/20/11 Start weight at [facility]: 213 lbs  Weight today: 137.7 lbs Weight change: 17.3 lbs Total weight lost: 75.3 lbs BMI: 26.0% Weight goal: 140 lbs % Weight goal met: 103%  24-hr recall: B (7-8 AM): Protein shake (Atkins) OR [employer] [place] Protein Bar OR Caramel Swirl Greek yogurt bar Snk (9-10 AM): Not usually snacking OR 1 oz cheese  L (12-1 PM): Deli meat and cheese (3 oz) w/ 1/2 cup pinto beans  Snk (3-4 PM): None  D (6 PM): Grilled shrimp (5) OR Crock pot chicken w/ 1/2 cup vegetable or beans  Snk (8-9 PM): Healthy Choice Fudge Bar (1/2)  Fluid intake: 40-45 oz Estimated total protein intake: 60g  Medications: No changes to meds; See updated list Supplementation: Taking supplements regularly  Using straws: No Drinking while eating: No Hair loss: Yes, mild Carbonated beverages: No N/V/D/C: No Dumping syndrome: None reported  Recent physical activity:  30-45 minutes, 6-7 times/week  Progress Towards Goal(s):  In progress.   Nutritional Diagnosis:  Inadequate protein intake related to limited portions of protein consumed as evidenced by pt meeting ~75% of estimated protein needs and report of hair loss.    Intervention:  Nutrition education.  Monitoring/Evaluation:  Dietary intake, exercise, protein intake, and body weight. Follow up in 6 months for 12 month post-op visit.

## 2011-09-26 NOTE — Patient Instructions (Signed)
Goals (continue): Follow Phase 3B: High Protein + Non-Starchy Vegetables  Eat 3-6 small meals/snacks, every 3-5 hrs  Continue lean protein foods to meet 60-70g goal  Increase fluid intake to 64oz +  Add 15 grams of carbohydrate (fruit, whole grain, starchy vegetable) with meals (HIGH FIBER)  Avoid drinking 15 minutes before, during and 30 minutes after eating  Aim for >30 min of physical activity daily  Congrats on meeting your goal!! :-)

## 2011-10-05 ENCOUNTER — Telehealth: Payer: Self-pay | Admitting: Pulmonary Disease

## 2011-10-05 NOTE — Telephone Encounter (Signed)
LMOMTCB x 1 

## 2011-10-09 NOTE — Telephone Encounter (Signed)
Pt calling to be sure Dr. Ezzard Standing will be able to see her labs that were done here  I told her that [facility] is on the same systemand will be able to see all of her labs. Pt verbalized understanding.

## 2011-10-11 ENCOUNTER — Encounter (INDEPENDENT_AMBULATORY_CARE_PROVIDER_SITE_OTHER): Payer: Self-pay | Admitting: Surgery

## 2011-10-11 ENCOUNTER — Ambulatory Visit (INDEPENDENT_AMBULATORY_CARE_PROVIDER_SITE_OTHER): Payer: BC Managed Care – PPO | Admitting: Surgery

## 2011-10-11 VITALS — BP 122/84 | HR 64 | Temp 98.8°F | Resp 16 | Ht 61.0 in | Wt 135.8 lb

## 2011-10-11 DIAGNOSIS — Z9884 Bariatric surgery status: Secondary | ICD-10-CM

## 2011-10-11 NOTE — Progress Notes (Signed)
ASSESSMENT AND PLAN: 1. Morbid obesity.  Initial weight-196, BMI-37.12  She had a RY Gastric Bypass on 03/20/11.  She is now down to an ideal weight.  She has been very happy with her results.  She will see me back in 3 months.   2. Gastroesophageal reflux disease. Resolved. 3. Hypertension for 10 years. Resolved. 4. Hypercholesterolemia. On meds.  5. Mild anxiety.  6. Continuous period.  Saw Dr. Wyvonnia Lora, who is leaving.  She is now seeing Dr. Tenny Craw.  Still having issues.  She is still having problems and sounds like she may pursue surgery.  Her RYGB should not effect this. 7.  Mild Fe anemia - on supplemental Fe.  HISTORY OF PRESENT ILLNESS: Chief Complaint  Patient presents with  . Bariatric Follow Up    RNY 03/20/11    Katelyn Bailey is a 46 y.o. (DOB: 10-20-1965)  white female who is a patient of NADEL,SCOTT M, MD, MD and comes to me today for follow up of RYGB.  Her preop weight was Wt 196 lb 8 oz (89.132 kg)  and a BMI 37.13.  She has done well. She is eating well.  No nausea.  No problems with bowels.  PHYSICAL EXAM: BP 122/84  Pulse 64  Temp(Src) 98.8 F (37.1 C) (Temporal)  Resp 16  Ht 5\' 1"  (1.549 m)  Wt 135 lb 12.8 oz (61.598 kg)  BMI 25.66 kg/m2  General:  Looks good. HEENT: Normal. Pupils equal. Normal dentition. Neck: Supple. No thyroid mass. Lungs: Clear and symmetric. Heart:  RRR. No murmur. Abdomen:  Incisions well healed. No tenderness. No hernia. Normal bowel sounds.  Incisions look good. Has excess skin. Extremities:  Good strength in upper and lower extremities. Neurologic:  Grossly intact to motor and sensory function.  DATA REVIEWED: Labs drawn by Dr. Kriste Basque - Hgb 12.4, Fe - 63 - 09/11/2011.

## 2012-01-05 ENCOUNTER — Ambulatory Visit (INDEPENDENT_AMBULATORY_CARE_PROVIDER_SITE_OTHER): Payer: BC Managed Care – PPO | Admitting: Pulmonary Disease

## 2012-01-05 ENCOUNTER — Encounter: Payer: Self-pay | Admitting: Pulmonary Disease

## 2012-01-05 VITALS — BP 110/68 | HR 60 | Temp 97.3°F | Ht 61.0 in | Wt 127.2 lb

## 2012-01-05 DIAGNOSIS — F411 Generalized anxiety disorder: Secondary | ICD-10-CM

## 2012-01-05 DIAGNOSIS — E7800 Pure hypercholesterolemia, unspecified: Secondary | ICD-10-CM

## 2012-01-05 DIAGNOSIS — K219 Gastro-esophageal reflux disease without esophagitis: Secondary | ICD-10-CM

## 2012-01-05 DIAGNOSIS — E663 Overweight: Secondary | ICD-10-CM

## 2012-01-05 DIAGNOSIS — Z9884 Bariatric surgery status: Secondary | ICD-10-CM

## 2012-01-05 DIAGNOSIS — Z23 Encounter for immunization: Secondary | ICD-10-CM

## 2012-01-05 DIAGNOSIS — I872 Venous insufficiency (chronic) (peripheral): Secondary | ICD-10-CM

## 2012-01-05 DIAGNOSIS — I10 Essential (primary) hypertension: Secondary | ICD-10-CM

## 2012-01-05 NOTE — Patient Instructions (Signed)
Today we updated your med list in our EPIC system...    Continue your current medications the same...  We gave you the combination Tetanus vaccine today called the TDAP (it should be good for 10 yrs)...  Congrats on your weight loss...  Call for any problems or if we can be of service in any way...  Let's plan a follow up visit early in 2014 w/ fasting labs etc..Marland Kitchen

## 2012-01-06 ENCOUNTER — Encounter: Payer: Self-pay | Admitting: Pulmonary Disease

## 2012-01-06 NOTE — Progress Notes (Signed)
Subjective:    Patient ID: Katelyn Bailey, female    DOB: 1966/02/09, 46 y.o.   MRN: [id number]  HPI 46 y/o WF here for a follow up visit... she has mult med problems including:  HBP, Ven Insuffic;  Hyperchol;  Obesity;  HH/ GERD; and Anxiety...   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    On Simva40 w/ good numbers 6/12; now weight down further & she is anxious to stop the med & see how the FLP looks on her new diet alone; f/u FLP 15mo...    On Protonix 40mg /d & she denies GERD symptoms, dysphagia, reflux, LER, etc;  OK to wean this med to Qod & off as long as she remains asymptomatic...  ~  July 07, 2011:  15mo ROV & Katelyn Bailey is feeling well- no new complaints or concerns;  She has lost a further 27# to 156# today & she is very pleased;  BP has been running sl low on her LisinoprilHCT 1/2 tab daily & we discussed stopping this med now & following BP at home;  Already off statin Rx & FLP looks great now on diet alone;  Chems normal, CBC w/ mild anemia Hg=11.3/ Fe=28 (7%sat) and advised to start Fe 325mg  + VitC 500mg  daily (we will recheck lab & if no better she may need the Fe IV);  B12, Folate/ Mag are all ok...  ~  September 11, 2011:  39mo ROV & add-on for occipital area pain> c/o left neck/ occipital/ post head pain that comes & goes w/o known ppt cause; present for several weeks, occurs 2+ times daily, pain is severe rated 7-8/10 & lasts 10-17min 7 resolves spont (no  meds needed); denies visual symptoms, N/V, etc; she has never had similar pain;  Exam shows sl tender over left occiput, full ROM neck, no neuro findings, etc...  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    NOTE: she had f/u FASTING blood work today> FLP looks great, BS normal> all on diet alone; just dec Ferritin= 5.1 & rec to continue FeSO4 daily for now...  ~  January 05, 2012:  33mo ROV & Katelyn Bailey looks great, now 127# down from 224# [id number] & most of her medical problems have resolved w/ the 100# wt loss after her Bariatric surg...  SEE UPDATED PROB LIST BELOW>>    Her CC continues to be her occipital headaches, c/w occip neuralgia, & she's been eval by [facility]; she's had shots which help for awhile & is currently on NEURONTIN 600mg /d- improved but HAs persist 7 she has f/u appt soon.Marland KitchenMarland Kitchen  Problem List:  Hx of ESSENTIAL HYPERTENSION >> RESOLVED w/ weight loss after bariatric surg...  prev on Lisinopril/Hct 20-12.5 & weaned off Rx w/ normal BP off meds now... ~  CXR 1/12 showed normal heart size, clear lungs... ~  EKG 1/12 showed NSR,  NSSTTWA... ~  9/12:  BP= 124/84 today on 1/2 of the LisinoprilHCT & similar at home, & we discussed continuing same for now... denies HA, visual changes, CP, palipit, dizziness, syncope, dyspnea, edema, etc... ~  12/12:  BP= 118/62 & even lower at home; she is anxious to stop the med & seen how she does monitoring BP at home.. ~  2/13:  BP= 122/84 off all meds, great job! ~  6/13:  BP= 110/68  VENOUS INSUFFICIENCY, LEGS (ICD-459.81) - follows a low sodium diet, & we discussed elevation & support hose to minimize any swelling...  Hx of HYPERCHOLESTEROLEMIA >> prev on Simvastatin 40mg /d & now  off all meds after bariatric surg... 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 ~  FLP 12/12 on diet alone showed TChol 152, TG 52, HDL 40, LDL 102 ~  FLP 2/13 on diet alone showed TChol 152, TG 47, HDL 40, LDL 103... Ok on diet alone, off meds.  Hx of OVERWEIGHT (ICD-278.02) >>  ~  Past Hx from old chart> 200# in 2001; 207# in '02; 210# in '05; 219# in '07; & 211# 8/08 ~  weight 5/09 = down to 175# with all the stress (38 y/o son died suddenly 04-19-23 ?cause) ~  weight 1/11 = 205#... on weight watchers now. ~  weight 9/11 = 222#... she is 30" tall, & BMI= 42. ~  weight 1/12 = 224#.Marland KitchenMarland Kitchen we reviewed low carb, low fat, low salt diet. ~  weight 2/12 = 219#... Insurance co required 85mo medically supervised wt loss program. 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 BMI= 29 ~  Weight 2/13 = 143#... 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 ACID REFLUX DISEASE (ICD-530.81) - 8/08 eval w/ LER, improved w/ PROTONIX 40/d... ~  UGI series post bypass surg> normal w/o anastomotic leak etc... ~  9/12:  OK to try to wean the Protonix off, titrate to any symptoms...  HEADACHES >> Hx c/w occipital Neuralgia & we tried  Topamax 2/13 w/o relief;  [facility] & she received series of shots in occipital area w/ temp relief; now on NEURONTIN 600mg /d & they are titrating  up the dose... DIZZINESS (ICD-780.4) - uses meclizine as needed.  ANXIETY (ICD-300.00) - on ALPRAZOLAM 0.5mg  Qhs Prn... ~  28-Apr-2023:  tragically her 74 y/o son died in his sleep 2023/04/28 and autopsy showed no apparent cause, he was healthy without medical problems... she lost 35 lbs w/ the stress... she is getting counselling thru hospice... she has a 26 y/o son at home.  ANEMIA >> started on Fe 325mg /d + VitC 500mg /d + Vit B12 535mcg/d... ~  Labs 8/12 showed Hg= 11.0, /mcv= 88 ~  Labs 12/12 showed Hg= 11.3, MCV= 86, Fe= 28 (7%sat).. rec to start Fe+VitC... ~  Labs 2/13 on Fe daily showed Hg= 12.4, MCV= 90, Fe= 63 (17%), Ferritin= 5.1 (10-291), B12= 752  Health Maintenance: ~  GI:  given stool cards to check for hidden blood; otherwise colonoscopy not due til after age 9... ~  GYN:  DrSilva- with PAP & Mammogram each spring... ~  Immunizations:  she gets the yearly Flu vaccines each autumn;  Given TDAP here 6/13;  she will not require a Pneumonia vaccine until age 89, unless the current recommendations change...   Past Surgical History  Procedure Date  . Tubal ligation 1997  . Cesarean section 1990  . Roux-en-y gastric bypass 03/20/2011    Outpatient Encounter Prescriptions as of 01/05/2012  Medication Sig Dispense Refill  . ALPRAZolam (XANAX) 0.5 MG tablet take 1/2 TO 1 tablet by mouth three times a day if needed for nerves  100 tablet  5  . calcium citrate-vitamin D 200-200 MG-UNIT TABS Take 2 tablets by mouth daily.       . cyanocobalamin 500 MCG tablet Take 500 mcg by mouth daily.        . Ferrous Sulfate (IRON) 325 (65 FE) MG TABS Take 325 mg by mouth daily.        Marland Kitchen gabapentin (NEURONTIN) 600 MG tablet Take 600 mg by mouth daily.      . Multiple Vitamins-Minerals (MULTIVITAMIN WITH MINERALS) tablet Take 2 tablets by  mouth daily.        Marland Kitchen DISCONTD: gabapentin (NEURONTIN) 300 MG capsule daily.      Marland Kitchen DISCONTD: medroxyPROGESTERone (DEPO-PROVERA) 150 MG/ML injection         No Known Allergies   Current Medications, Allergies, Past Medical History, Past Surgical History, Family History, and Social History were reviewed in [employer] record.    Review of Systems       See HPI - all other systems neg except as noted...  The patient notes steady wt reduction s/p gastic bypass surg 8/12;  She denies anorexia, fever, vision loss, decreased hearing, hoarseness, chest pain, syncope, dyspnea on exertion, peripheral edema, prolonged cough, headaches, hemoptysis, abdominal pain, melena, hematochezia, severe indigestion/heartburn, hematuria, incontinence, muscle weakness, suspicious skin lesions, transient blindness, difficulty walking, depression, abnormal bleeding, enlarged lymph nodes, and angioedema.     Objective:   Physical Exam    WD, overweight, 46 y/o WF in NAD.Marland KitchenMarland Kitchen 207#, 61" tall, BMI=39 GENERAL:  Alert & oriented; pleasant & cooperative. HEENT:  [place]/AT, EOM-full, PERRLA, EACs-clear, TMs-wnl, NOSE-clear, THROAT-clear & wnl. NECK:  Supple w/ full ROM; no JVD; normal carotid impulses w/o bruits; no thyromegaly or nodules palpated; no lymphadenopathy. CHEST:  Clear to P & A; without wheezes/ rales/ or rhonchi. HEART:  Regular Rhythm; without murmurs/ rubs/ or gallops. ABDOMEN:  Soft & nontender, 5 sm incisions from Lap surg; normal bowel sounds; no organomegaly or masses detected. EXT: without deformities or arthritic changes; no  varicose veins/ venous insuffic/ or edema.  DERM:  no lesions seen, no rashes, etc...  RADIOLOGY DATA:  Reviewed in the EPIC EMR & discussed w/ the patient...  LABORATORY DATA:  Reviewed in the EPIC EMR & discussed w/ the patient...   Assessment & Plan:   Occipital Neuralgia>  Now followed by [facility] w/ occipital shots that helped for  awhile, and on NEURONTIN being titrated...  HBP>  BP today is stable off all meds on diet alone...  CHOL>  FLP looks great off the Simva on diet alone...  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  GERD>  She is off the Protonix as well & denies abd pain, reflux, etc...  Anxiety>  She is coping well w/ the stressors in her life...  ANEMIA>  Hx mild anemia & eval revealed low Fe; supplemented orally & improved but Ferritin still low so we will continue the FeSO4 daily.   Patient's Medications  New Prescriptions   No medications on file  Previous Medications   ALPRAZOLAM (XANAX) 0.5 MG TABLET    take 1/2 TO 1 tablet by mouth three times a day if needed for nerves   CALCIUM CITRATE-VITAMIN D 200-200 MG-UNIT TABS    Take 2 tablets by mouth daily.    CYANOCOBALAMIN 500 MCG TABLET    Take 500 mcg by mouth daily.     FERROUS SULFATE (IRON) 325 (65 FE) MG TABS    Take 325 mg by mouth daily.     GABAPENTIN (NEURONTIN) 600 MG TABLET    Take 600 mg by mouth daily.   MULTIPLE VITAMINS-MINERALS (MULTIVITAMIN WITH MINERALS) TABLET    Take 2 tablets by mouth daily.    Modified Medications   No medications on file  Discontinued Medications   GABAPENTIN (NEURONTIN) 300 MG CAPSULE    daily.   MEDROXYPROGESTERONE (DEPO-PROVERA) 150 MG/ML INJECTION

## 2012-02-15 ENCOUNTER — Ambulatory Visit (INDEPENDENT_AMBULATORY_CARE_PROVIDER_SITE_OTHER): Payer: BC Managed Care – PPO | Admitting: Surgery

## 2012-02-15 ENCOUNTER — Encounter (INDEPENDENT_AMBULATORY_CARE_PROVIDER_SITE_OTHER): Payer: Self-pay | Admitting: Surgery

## 2012-02-15 VITALS — BP 112/72 | HR 68 | Temp 98.8°F | Resp 12 | Ht 61.0 in | Wt 118.4 lb

## 2012-02-15 DIAGNOSIS — Z9884 Bariatric surgery status: Secondary | ICD-10-CM

## 2012-02-15 NOTE — Progress Notes (Signed)
Name:  Katelyn Bailey Date of Birth:  1966-05-16 MRN:  [id number]   ASSESSMENT AND PLAN: 1. Morbid obesity.  Initial weight-196, BMI-37.12  She had a RY Gastric Bypass on 03/20/11.  She is now down to an ideal weight.  She has been very happy with her results.  She has done exceptionally well.  We will get her one year labs (she will get them in Katelyn Bailey), do the one year follow up picture, and I will see her back in one year.  2. Gastroesophageal reflux disease. Resolved. 3. Hypertension for 10 years. Resolved. 4. Hypercholesterolemia. Off meds.  5. Mild anxiety.  6. Continuous period.    She is seeing Dr. Tenny Craw.  Still having issues.    She said she will probably get a hysterectomy this fall.  7.  Mild Fe anemia - on supplemental Fe.  HISTORY OF PRESENT ILLNESS: Chief Complaint  Patient presents with  . Bariatric Follow Up    rny   Katelyn Bailey is a 46 y.o. (DOB: [birth year])  white female who is a patient of NADEL,SCOTT M, MD and comes to me today for follow up of RYGB.  Her preop weight was Wt 196 lb 8 oz (89.132 kg)  and a BMI 37.13.  She has done well. She is eating well.  No nausea.  She walks 2 miles a day, in about 30 minutes.  She just got back from [place].  PHYSICAL EXAM: BP 112/72  Pulse 68  Temp 98.8 F (37.1 C) (Temporal)  Resp 12  Ht 5\' 1"  (1.549 m)  Wt 118 lb 6.4 oz (53.706 kg)  BMI 22.37 kg/m2  General:  Looks good. HEENT: Normal. Pupils equal. Normal dentition. Neck: Supple. No thyroid mass. Lungs: Clear and symmetric. Heart:  RRR. No murmur. Abdomen:  Incisions well healed. No tenderness. No hernia. Normal bowel sounds.  Incisions look good. Has excess skin. Extremities:  Good strength in upper and lower extremities. Neurologic:  Grossly intact to motor and sensory function.  DATA REVIEWED: No new labs.  Ovidio Kin, MD, [facility] Pager: [phone] Office phone:  [phone]

## 2012-02-20 ENCOUNTER — Other Ambulatory Visit: Payer: Self-pay | Admitting: Pulmonary Disease

## 2012-02-20 LAB — CBC WITH DIFFERENTIAL/PLATELET
Basophils Absolute: 0.1 10*3/uL (ref 0.0–0.2)
Basos: 1 % (ref 0–3)
Eos: 1 % (ref 0–7)
Eosinophils Absolute: 0 10*3/uL (ref 0.0–0.4)
HCT: 41.2 % (ref 34.0–46.6)
Hemoglobin: 13.1 g/dL (ref 11.1–15.9)
Immature Grans (Abs): 0 10*3/uL (ref 0.0–0.1)
Immature Granulocytes: 0 % (ref 0–2)
Lymphocytes Absolute: 2.1 10*3/uL (ref 0.7–4.5)
Lymphs: 41 % (ref 14–46)
MCH: 28.9 pg (ref 26.6–33.0)
MCHC: 31.8 g/dL (ref 31.5–35.7)
MCV: 91 fL (ref 79–97)
Monocytes Absolute: 0.4 10*3/uL (ref 0.1–1.0)
Monocytes: 7 % (ref 4–13)
Neutrophils Absolute: 2.6 10*3/uL (ref 1.8–7.8)
Neutrophils Relative %: 50 % (ref 40–74)
RBC: 4.54 x10E6/uL (ref 3.77–5.28)
RDW: 15.8 % — ABNORMAL HIGH (ref 12.3–15.4)
WBC: 5.1 10*3/uL (ref 4.0–10.5)

## 2012-02-20 LAB — MAGNESIUM: Magnesium: 2 mg/dL (ref 1.6–2.6)

## 2012-02-20 LAB — LIPID PANEL
Chol/HDL Ratio: 3.2 ratio units (ref 0.0–4.4)
Cholesterol, Total: 164 mg/dL (ref 100–199)
HDL: 52 mg/dL (ref >39)
LDL Calculated: 96 mg/dL (ref 0–99)
Triglycerides: 78 mg/dL (ref 0–149)
VLDL Cholesterol Cal: 16 mg/dL (ref 5–40)

## 2012-02-20 LAB — COMPREHENSIVE METABOLIC PANEL
ALT: 21 IU/L (ref 0–32)
AST: 19 IU/L (ref 0–40)
Albumin/Globulin Ratio: 1.8 (ref 1.1–2.5)
Albumin: 4.2 g/dL (ref 3.5–5.5)
Alkaline Phosphatase: 68 IU/L (ref 42–107)
BUN/Creatinine Ratio: 17 (ref 9–23)
BUN: 12 mg/dL (ref 6–24)
CO2: 24 mmol/L (ref 19–28)
Calcium: 9.2 mg/dL (ref 8.7–10.2)
Chloride: 104 mmol/L (ref 97–108)
Creatinine, Ser: 0.7 mg/dL (ref 0.57–1.00)
GFR calc Af Amer: 122 mL/min/{1.73_m2} (ref >59)
GFR calc non Af Amer: 106 mL/min/{1.73_m2} (ref >59)
Globulin, Total: 2.4 g/dL (ref 1.5–4.5)
Glucose: 83 mg/dL (ref 65–99)
Potassium: 4 mmol/L (ref 3.5–5.2)
Sodium: 141 mmol/L (ref 134–144)
Total Bilirubin: 0.3 mg/dL (ref 0.0–1.2)
Total Protein: 6.6 g/dL (ref 6.0–8.5)

## 2012-02-20 LAB — VITAMIN B12: Vitamin B-12: >1999 pg/mL — ABNORMAL HIGH (ref 211–946)

## 2012-02-20 LAB — IRON AND TIBC
Iron Saturation: 28 % (ref 15–55)
Iron: 100 ug/dL (ref 35–155)
TIBC: 356 ug/dL (ref 250–450)
UIBC: 256 ug/dL (ref 150–375)

## 2012-02-20 LAB — FOLATE: Folate: 18.7 ng/mL (ref >3.0)

## 2012-02-23 LAB — VITAMIN B1, WHOLE BLOOD: Thiamine: 164.6 nmol/L (ref 66.5–200.0)

## 2012-02-29 ENCOUNTER — Other Ambulatory Visit: Payer: Self-pay | Admitting: Obstetrics and Gynecology

## 2012-03-20 ENCOUNTER — Encounter (HOSPITAL_COMMUNITY): Payer: Self-pay | Admitting: Pharmacist

## 2012-03-25 ENCOUNTER — Encounter: Payer: BC Managed Care – PPO | Attending: Surgery | Admitting: *Deleted

## 2012-03-25 ENCOUNTER — Encounter: Payer: Self-pay | Admitting: *Deleted

## 2012-03-25 VITALS — Ht 61.0 in | Wt 118.8 lb

## 2012-03-25 DIAGNOSIS — E663 Overweight: Secondary | ICD-10-CM

## 2012-03-25 DIAGNOSIS — Z09 Encounter for follow-up examination after completed treatment for conditions other than malignant neoplasm: Secondary | ICD-10-CM | POA: Insufficient documentation

## 2012-03-25 DIAGNOSIS — Z713 Dietary counseling and surveillance: Secondary | ICD-10-CM | POA: Insufficient documentation

## 2012-03-25 DIAGNOSIS — Z9884 Bariatric surgery status: Secondary | ICD-10-CM | POA: Insufficient documentation

## 2012-03-25 NOTE — Progress Notes (Addendum)
  Follow-up visit:  12 Months Post-Operative Gastric Bypass Surgery  Medical Nutrition Therapy:  Appt start time: 1630  End time:  1700.  Primary concerns today: Post-operative bariatric surgery nutrition management.  Katelyn Bailey returns for her 12 month post-op appointment with an additional 18.9 lb wt loss. Reports no problems and desires to stay at her current weight.  Partial hysterectomy scheduled for 04/09/12. Discussed ways to increase protein to maintain muscle mass/current weight during surgery and recovery.  Surgery date: 03/20/11 Start weight at [facility]: 213 lbs  Weight today: 118.8 lbs Weight change: 18.8 lbs Total weight lost:  94.2 lbs BMI: 22.4 kg/m^2  Weight goal: 140 lbs % Weight goal met: 118% Weight maintenance goal: 118-120 lbs  24-hr recall: B (7-8 AM): Protein shake (Atkins) OR [employer] Protein Bar (salted caramel) Snk (9-10 AM): 1 oz cheese (8g) L (12-1 PM): Deli meat and cheese (3 oz) w/ 1/2 cup pinto beans  Snk (3-4 PM): None  D (6 PM): Grilled shrimp (5) OR Crock pot chicken w/ 1/2 cup vegetable or beans  Snk (8-9 PM): Healthy Choice Fudge Bar (1/2) OR none  Fluid intake: 50-55 oz Estimated total protein intake: 65-70g  Medications: No changes to meds Supplementation: Taking supplements regularly  Using straws: No Drinking while eating: No Hair loss: No Carbonated beverages: No N/V/D/C: No Dumping syndrome: None reported  Recent physical activity:  30-45 minutes, 5 times/week  Progress Towards Goal(s):  In progress.   Nutritional Diagnosis:  [place]-3.3 Overweight/obesity related to recent RYGB surgery as evidenced by patient attempting to follow post-op nutrition guidelines for continued weight loss.    Intervention:  Nutrition education/reinforcement.  Monitoring/Evaluation:  Dietary intake, exercise, protein intake, and body weight. Follow up in 12 months for 2 year annual post-op visit or prn.

## 2012-03-25 NOTE — Patient Instructions (Signed)
Goals (continue): Follow Bariatric Surgery Specialized Post-Op Diet  Continue lean protein foods to meet 70-80g goal  Increase fluid intake to 64oz +  Add 15 grams of carbohydrate (fruit, whole grain, starchy vegetable) with meals (HIGH FIBER)  Avoid drinking 15 minutes before, during and 30 minutes after eating  Continue physical activity daily  We'll see you in a year.  Make sure to call or email if you need any help or find you are gaining weight.

## 2012-03-29 ENCOUNTER — Encounter (HOSPITAL_COMMUNITY): Payer: Self-pay

## 2012-03-29 ENCOUNTER — Encounter (HOSPITAL_COMMUNITY)
Admission: RE | Admit: 2012-03-29 | Discharge: 2012-03-29 | Disposition: A | Discharge status: Home / Self Care | Payer: BC Managed Care – PPO | Source: Ambulatory Visit | Attending: Obstetrics and Gynecology | Admitting: Obstetrics and Gynecology

## 2012-03-29 HISTORY — DX: Neuralgia and neuritis, unspecified: M79.2

## 2012-03-29 HISTORY — DX: Other complications of anesthesia, initial encounter: T88.59XA

## 2012-03-29 HISTORY — DX: Headache: R51

## 2012-03-29 HISTORY — DX: Adverse effect of unspecified anesthetic, initial encounter: T41.45XA

## 2012-03-29 HISTORY — DX: Calculus of kidney: N20.0

## 2012-03-29 LAB — CBC
HCT: 38.1 % (ref 36.0–46.0)
Hemoglobin: 12.4 g/dL (ref 12.0–15.0)
MCH: 29.7 pg (ref 26.0–34.0)
MCHC: 32.5 g/dL (ref 30.0–36.0)
MCV: 91.1 fL (ref 78.0–100.0)
Platelets: 170 10*3/uL (ref 150–400)
RBC: 4.18 MIL/uL (ref 3.87–5.11)
RDW: 14.5 % (ref 11.5–15.5)
WBC: 5.4 10*3/uL (ref 4.0–10.5)

## 2012-03-29 LAB — BASIC METABOLIC PANEL
BUN: 12 mg/dL (ref 6–23)
CO2: 27 mEq/L (ref 19–32)
Calcium: 9.1 mg/dL (ref 8.4–10.5)
Chloride: 105 mEq/L (ref 96–112)
Creatinine, Ser: 0.66 mg/dL (ref 0.50–1.10)
GFR calc Af Amer: >90 mL/min (ref >90)
GFR calc non Af Amer: >90 mL/min (ref >90)
Glucose, Bld: 79 mg/dL (ref 70–99)
Potassium: 4.2 mEq/L (ref 3.5–5.1)
Sodium: 140 mEq/L (ref 135–145)

## 2012-03-29 LAB — PROTIME-INR
INR: 1.12 (ref 0.00–1.49)
Prothrombin Time: 14.6 seconds (ref 11.6–15.2)

## 2012-03-29 LAB — APTT: aPTT: 30 seconds (ref 24–37)

## 2012-03-29 LAB — SURGICAL PCR SCREEN
MRSA, PCR: NEGATIVE
Staphylococcus aureus: NEGATIVE

## 2012-03-29 NOTE — Patient Instructions (Addendum)
Your procedure is scheduled on:04/09/12  Enter through the Main Entrance at :6am Pick up desk phone and dial [phone] and inform us of your arrival.  Please call [phone] if you have any problems the morning of surgery.  Remember: Do not eat after midnight: Monday Do not drink after:midnight Monday  Take these meds the morning of surgery with a sip of water:none  DO NOT wear jewelry, eye make-up, lipstick,body lotion, or dark fingernail polish. Do not shave for 48 hours prior to surgery.  If you are to be admitted after surgery, leave suitcase in car until your room has been assigned. Patients discharged on the day of surgery will not be allowed to drive home.   Remember to use your Hibiclens as instructed.

## 2012-04-08 NOTE — H&P (Signed)
Katelyn Bailey is an 46 y.o. female. G2 P2002 presents with a history of irregular and heavy menses associated with pelvic pain. She was offered a variety of options to deal with the pain and bleeding including a six month course of Depo Lupron but at this point she wants definitive therapy via hysterectomy.   Pertinent Gynecological History: Menses: heavy with clots Bleeding: dysfunctional uterine bleeding Contraception: tubal ligation DES exposure: denies Blood transfusions: yes Sexually transmitted diseases: no past history Previous GYN Procedures: Leep and C/Section  Last pap: normal Date:11/29/2011  OB History: G2, P2002   Menstrual History:  No LMP recorded.    Past Medical History  Diagnosis Date  . Hypercholesterolemia   . Overweight   . Dizziness   . Occipital neuralgia   . Headache   . Neuralgia     occipital  . Hiatal hernia     resolved after gastric bypass  . Acid reflux disease     resolved with wt loss  . Essential hypertension   . Kidney stone   . Complication of anesthesia     headaches postop    Past Surgical History  Procedure Date  . Tubal ligation 1997  . Cesarean section 1990  . Roux-en-y gastric bypass 03/20/2011    Family History  Problem Relation Age of Onset  . Heart disease Father   . Cancer Mother     breast  . Heart disease Paternal Grandfather     Social History:  reports that she has never smoked. She has never used smokeless tobacco. She reports that she does not drink alcohol or use illicit drugs.  Allergies: No Known Allergies  No prescriptions prior to admission    ROS  Respiratory: no SOB or cough GI: no nausea, vomiting, diarrhea, constipation or melena GU: No dysuria, frequency or urgency. No hematuria Gyn: See HPI Neurologic: positive for occipital headaches   Height 5' (1.524 m), weight 118 lb (53.524 kg). Physical Exam  Head: Normocephalic and atraumatic Eyes: PERRLA, conjunctiva pink, sclera non  icteric Neck: Supple no JVD no thyromegaly Chest: Clear Heart: regular rhythm no murmur or gallop Abdomen: soft with no enlargement of the liver kidneys or spleen. Non tender no masses are felt. Back: No CVA tenderess Pelvic exam:  External genitalia are within normal limits                        BUS: normal                        Vagina without lesion or significant discharge                         Cervix is without lesion                         Uterus: retroflexed slightly tender to touch and motion, 8 week size                          Adnexa: no masses are felt, non tender    No results found for this or any previous visit (from the past 24 hour(s)).  No results found.  Assessment/Plan: Menorrhagia, uterine fibroids, chronic pelvic pain  Plan: Exam under anesthesia and the decision whether to proceed with LAVH versus TAH. The risks have been discussed with the patient including but no limited  to infection, hemorrhage and transfusion, injury to adjacent structures. All questions posed have been answered.  Leita Lindbloom 04/08/2012, 3:14 PM

## 2012-04-09 ENCOUNTER — Encounter (HOSPITAL_COMMUNITY)
Admission: RE | Disposition: A | Discharge status: Home / Self Care | Payer: Self-pay | Source: Ambulatory Visit | Attending: Obstetrics and Gynecology

## 2012-04-09 ENCOUNTER — Encounter (HOSPITAL_COMMUNITY): Payer: Self-pay | Admitting: Anesthesiology

## 2012-04-09 ENCOUNTER — Encounter (HOSPITAL_COMMUNITY): Payer: Self-pay | Admitting: *Deleted

## 2012-04-09 ENCOUNTER — Ambulatory Visit (HOSPITAL_COMMUNITY): Payer: BC Managed Care – PPO | Admitting: Anesthesiology

## 2012-04-09 ENCOUNTER — Inpatient Hospital Stay (HOSPITAL_COMMUNITY)
Admission: RE | Admit: 2012-04-09 | Discharge: 2012-04-11 | DRG: 359 | Disposition: A | Discharge status: Home / Self Care | Payer: BC Managed Care – PPO | Source: Ambulatory Visit | Attending: Obstetrics and Gynecology | Admitting: Obstetrics and Gynecology

## 2012-04-09 DIAGNOSIS — D251 Intramural leiomyoma of uterus: Secondary | ICD-10-CM | POA: Diagnosis present

## 2012-04-09 DIAGNOSIS — N949 Unspecified condition associated with female genital organs and menstrual cycle: Secondary | ICD-10-CM | POA: Diagnosis present

## 2012-04-09 DIAGNOSIS — D219 Benign neoplasm of connective and other soft tissue, unspecified: Secondary | ICD-10-CM

## 2012-04-09 DIAGNOSIS — N920 Excessive and frequent menstruation with regular cycle: Principal | ICD-10-CM | POA: Diagnosis present

## 2012-04-09 HISTORY — PX: ABDOMINAL HYSTERECTOMY: SHX81

## 2012-04-09 LAB — HEMOGLOBIN: Hemoglobin: 11.6 g/dL — ABNORMAL LOW (ref 12.0–15.0)

## 2012-04-09 LAB — PREGNANCY, URINE: Preg Test, Ur: NEGATIVE

## 2012-04-09 SURGERY — HYSTERECTOMY, ABDOMINAL
Anesthesia: General | Site: Abdomen | Wound class: Clean Contaminated

## 2012-04-09 MED ORDER — HYDROMORPHONE HCL PF 1 MG/ML IJ SOLN
INTRAMUSCULAR | Status: AC
  Administered 2012-04-09: 0.5 mg via INTRAVENOUS
  Filled 2012-04-09: qty 1

## 2012-04-09 MED ORDER — BUPIVACAINE 0.25 % ON-Q PUMP DUAL CATH 300 ML: 300.0000 mL | INJECTION | Status: DC

## 2012-04-09 MED ORDER — ONDANSETRON HCL 4 MG/2ML IJ SOLN
INTRAMUSCULAR | Status: DC | PRN
  Administered 2012-04-09: 4 mg via INTRAVENOUS

## 2012-04-09 MED ORDER — GLYCOPYRROLATE 0.2 MG/ML IJ SOLN
INTRAMUSCULAR | Status: DC | PRN
Start: 2012-04-09 — End: 2012-04-09
  Administered 2012-04-09: 0.6 mg via INTRAVENOUS
  Administered 2012-04-09: 0.2 mg via INTRAVENOUS

## 2012-04-09 MED ORDER — SODIUM CHLORIDE 0.9 % IJ SOLN: 9.0000 mL | INTRAMUSCULAR | Status: DC | PRN

## 2012-04-09 MED ORDER — CEFAZOLIN SODIUM 1-5 GM-% IV SOLN
1.0000 g | Freq: Three times a day (TID) | INTRAVENOUS | Status: DC
  Administered 2012-04-09 (×2): 1 g via INTRAVENOUS
  Filled 2012-04-09 (×3): qty 50

## 2012-04-09 MED ORDER — BUPIVACAINE HCL (PF) 0.25 % IJ SOLN
INTRAMUSCULAR | Status: DC | PRN
  Administered 2012-04-09: 330 mL

## 2012-04-09 MED ORDER — NEOSTIGMINE METHYLSULFATE 1 MG/ML IJ SOLN
INTRAMUSCULAR | Status: DC | PRN
  Administered 2012-04-09: 3 mg via INTRAVENOUS

## 2012-04-09 MED ORDER — GLYCOPYRROLATE 0.2 MG/ML IJ SOLN
INTRAMUSCULAR | Status: AC
  Filled 2012-04-09: qty 1

## 2012-04-09 MED ORDER — ONDANSETRON HCL 4 MG/2ML IJ SOLN
INTRAMUSCULAR | Status: AC
  Filled 2012-04-09: qty 2

## 2012-04-09 MED ORDER — DIPHENHYDRAMINE HCL 12.5 MG/5ML PO ELIX: 12.5000 mg | ORAL_SOLUTION | Freq: Four times a day (QID) | ORAL | Status: DC | PRN

## 2012-04-09 MED ORDER — MENTHOL 3 MG MT LOZG
1.0000 | LOZENGE | OROMUCOSAL | Status: DC | PRN
  Administered 2012-04-10: 3 mg via ORAL
  Filled 2012-04-09: qty 9

## 2012-04-09 MED ORDER — FENTANYL CITRATE 0.05 MG/ML IJ SOLN
INTRAMUSCULAR | Status: AC
  Filled 2012-04-09: qty 2

## 2012-04-09 MED ORDER — LIDOCAINE HCL (CARDIAC) 20 MG/ML IV SOLN
INTRAVENOUS | Status: AC
  Filled 2012-04-09: qty 5

## 2012-04-09 MED ORDER — ROCURONIUM BROMIDE 50 MG/5ML IV SOLN
INTRAVENOUS | Status: AC
  Filled 2012-04-09: qty 1

## 2012-04-09 MED ORDER — HYDROMORPHONE HCL PF 1 MG/ML IJ SOLN
INTRAMUSCULAR | Status: AC
Start: 2012-04-09 — End: 2012-04-09
  Administered 2012-04-09: 0.5 mg via INTRAVENOUS
  Filled 2012-04-09: qty 1

## 2012-04-09 MED ORDER — GABAPENTIN 300 MG PO CAPS
600.0000 mg | ORAL_CAPSULE | Freq: Every day | ORAL | Status: DC
  Administered 2012-04-09 – 2012-04-10 (×2): 600 mg via ORAL
  Filled 2012-04-09 (×3): qty 2

## 2012-04-09 MED ORDER — DEXAMETHASONE SODIUM PHOSPHATE 10 MG/ML IJ SOLN
INTRAMUSCULAR | Status: AC
  Filled 2012-04-09: qty 1

## 2012-04-09 MED ORDER — INDIGOTINDISULFONATE SODIUM 8 MG/ML IJ SOLN
INTRAMUSCULAR | Status: AC
  Filled 2012-04-09: qty 5

## 2012-04-09 MED ORDER — BUPIVACAINE HCL (PF) 0.25 % IJ SOLN
INTRAMUSCULAR | Status: AC
  Filled 2012-04-09: qty 300

## 2012-04-09 MED ORDER — DIPHENHYDRAMINE HCL 50 MG/ML IJ SOLN: 12.5000 mg | Freq: Four times a day (QID) | INTRAMUSCULAR | Status: DC | PRN

## 2012-04-09 MED ORDER — ROCURONIUM BROMIDE 100 MG/10ML IV SOLN
INTRAVENOUS | Status: DC | PRN
  Administered 2012-04-09 (×2): 10 mg via INTRAVENOUS
  Administered 2012-04-09: 25 mg via INTRAVENOUS
  Administered 2012-04-09: 5 mg via INTRAVENOUS

## 2012-04-09 MED ORDER — NALOXONE HCL 0.4 MG/ML IJ SOLN: 0.4000 mg | INTRAMUSCULAR | Status: DC | PRN

## 2012-04-09 MED ORDER — KETOROLAC TROMETHAMINE 30 MG/ML IJ SOLN
30.0000 mg | Freq: Once | INTRAMUSCULAR | Status: AC
  Administered 2012-04-09: 30 mg via INTRAVENOUS

## 2012-04-09 MED ORDER — FENTANYL CITRATE 0.05 MG/ML IJ SOLN
INTRAMUSCULAR | Status: DC | PRN
  Administered 2012-04-09 (×3): 100 ug via INTRAVENOUS
  Administered 2012-04-09: 50 ug via INTRAVENOUS

## 2012-04-09 MED ORDER — LIDOCAINE HCL 1 % IJ SOLN
INTRAMUSCULAR | Status: DC | PRN
  Administered 2012-04-09: 20 mL

## 2012-04-09 MED ORDER — LACTATED RINGERS IV SOLN
INTRAVENOUS | Status: DC
  Administered 2012-04-09 (×3): via INTRAVENOUS

## 2012-04-09 MED ORDER — DEXAMETHASONE SODIUM PHOSPHATE 10 MG/ML IJ SOLN
INTRAMUSCULAR | Status: DC | PRN
  Administered 2012-04-09: 10 mg via INTRAVENOUS

## 2012-04-09 MED ORDER — MIDAZOLAM HCL 2 MG/2ML IJ SOLN
INTRAMUSCULAR | Status: AC
  Filled 2012-04-09: qty 2

## 2012-04-09 MED ORDER — KETOROLAC TROMETHAMINE 30 MG/ML IJ SOLN
INTRAMUSCULAR | Status: AC
  Filled 2012-04-09: qty 1

## 2012-04-09 MED ORDER — FENTANYL CITRATE 0.05 MG/ML IJ SOLN
INTRAMUSCULAR | Status: AC
  Filled 2012-04-09: qty 5

## 2012-04-09 MED ORDER — PROPOFOL 10 MG/ML IV BOLUS
INTRAVENOUS | Status: DC | PRN
  Administered 2012-04-09: 170 mg via INTRAVENOUS
  Administered 2012-04-09: 30 mg via INTRAVENOUS

## 2012-04-09 MED ORDER — HYDROMORPHONE HCL PF 1 MG/ML IJ SOLN
0.2500 mg | INTRAMUSCULAR | Status: DC | PRN
  Administered 2012-04-09 (×4): 0.5 mg via INTRAVENOUS

## 2012-04-09 MED ORDER — MIDAZOLAM HCL 5 MG/5ML IJ SOLN
INTRAMUSCULAR | Status: DC | PRN
  Administered 2012-04-09: 2 mg via INTRAVENOUS

## 2012-04-09 MED ORDER — GABAPENTIN 600 MG PO TABS
600.0000 mg | ORAL_TABLET | Freq: Every day | ORAL | Status: DC
  Filled 2012-04-09 (×2): qty 1

## 2012-04-09 MED ORDER — NEOSTIGMINE METHYLSULFATE 1 MG/ML IJ SOLN
INTRAMUSCULAR | Status: AC
  Filled 2012-04-09: qty 10

## 2012-04-09 MED ORDER — LIDOCAINE-EPINEPHRINE 1 %-1:100000 IJ SOLN
INTRAMUSCULAR | Status: DC | PRN
  Administered 2012-04-09: 30 mL

## 2012-04-09 MED ORDER — GLYCOPYRROLATE 0.2 MG/ML IJ SOLN
INTRAMUSCULAR | Status: AC
  Filled 2012-04-09: qty 3

## 2012-04-09 MED ORDER — HYDROMORPHONE 0.3 MG/ML IV SOLN
INTRAVENOUS | Status: DC
  Administered 2012-04-09: 1 mL via INTRAVENOUS
  Administered 2012-04-09: 2.4 mg via INTRAVENOUS
  Administered 2012-04-09: 11:00:00 via INTRAVENOUS
  Administered 2012-04-09: 9 mL via INTRAVENOUS
  Administered 2012-04-10: 1.9 mg via INTRAVENOUS
  Administered 2012-04-10: 1.2 mg via INTRAVENOUS
  Administered 2012-04-10: 0.6 mg via INTRAVENOUS
  Administered 2012-04-10: 1.5 mg via INTRAVENOUS
  Administered 2012-04-10: 08:00:00 via INTRAVENOUS
  Administered 2012-04-10: 1.2 mg via INTRAVENOUS
  Administered 2012-04-10 – 2012-04-11 (×2): 0.9 mg via INTRAVENOUS
  Administered 2012-04-11: 0.6 mg via INTRAVENOUS
  Filled 2012-04-09 (×2): qty 25

## 2012-04-09 MED ORDER — LIDOCAINE HCL (CARDIAC) 20 MG/ML IV SOLN
INTRAVENOUS | Status: DC | PRN
  Administered 2012-04-09: 50 mg via INTRAVENOUS

## 2012-04-09 MED ORDER — CEFAZOLIN SODIUM-DEXTROSE 2-3 GM-% IV SOLR
2.0000 g | INTRAVENOUS | Status: AC
  Administered 2012-04-09: 2 g via INTRAVENOUS

## 2012-04-09 MED ORDER — LACTATED RINGERS IV SOLN
INTRAVENOUS | Status: DC
  Administered 2012-04-09 – 2012-04-11 (×4): via INTRAVENOUS

## 2012-04-09 MED ORDER — PROPOFOL 10 MG/ML IV EMUL
INTRAVENOUS | Status: AC
  Filled 2012-04-09: qty 20

## 2012-04-09 MED ORDER — BUPIVACAINE HCL (PF) 0.25 % IJ SOLN
INTRAMUSCULAR | Status: AC
  Filled 2012-04-09: qty 30

## 2012-04-09 MED ORDER — CEFAZOLIN SODIUM-DEXTROSE 2-3 GM-% IV SOLR
INTRAVENOUS | Status: AC
  Filled 2012-04-09: qty 50

## 2012-04-09 MED ORDER — OXYCODONE-ACETAMINOPHEN 5-325 MG PO TABS: 1.0000 | ORAL_TABLET | ORAL | Status: DC | PRN

## 2012-04-09 MED ORDER — ONDANSETRON HCL 4 MG/2ML IJ SOLN
4.0000 mg | Freq: Four times a day (QID) | INTRAMUSCULAR | Status: DC | PRN
  Administered 2012-04-09: 4 mg via INTRAVENOUS
  Filled 2012-04-09: qty 2

## 2012-04-09 MED ORDER — IBUPROFEN 600 MG PO TABS: 600.0000 mg | ORAL_TABLET | Freq: Four times a day (QID) | ORAL | Status: DC | PRN

## 2012-04-09 SURGICAL SUPPLY — 42 items
BLADE SURG 10 STRL SS (BLADE) ×3 IMPLANT
BLADE SURG 15 STRL LF C SS BP (BLADE) ×3 IMPLANT
BLADE SURG 15 STRL SS (BLADE) ×4
CANISTER SUCTION 2500CC (MISCELLANEOUS) ×4 IMPLANT
CLOTH BEACON ORANGE TIMEOUT ST (SAFETY) ×4 IMPLANT
CONT PATH 16OZ SNAP LID 3702 (MISCELLANEOUS) ×4 IMPLANT
COVER MAYO STAND STRL (DRAPES) ×3 IMPLANT
COVER TABLE BACK 60X90 (DRAPES) ×4 IMPLANT
DECANTER SPIKE VIAL GLASS SM (MISCELLANEOUS) ×3 IMPLANT
DRESSING TELFA 8X3 (GAUZE/BANDAGES/DRESSINGS) ×3 IMPLANT
DRSG COVADERM 4X10 (GAUZE/BANDAGES/DRESSINGS) ×6 IMPLANT
ELECT REM PT RETURN 9FT ADLT (ELECTROSURGICAL) ×4
ELECTRODE REM PT RTRN 9FT ADLT (ELECTROSURGICAL) ×3 IMPLANT
GLOVE BIO SURGEON STRL SZ7.5 (GLOVE) ×12 IMPLANT
GLOVE BIOGEL PI IND STRL 6.5 (GLOVE) ×3 IMPLANT
GLOVE BIOGEL PI INDICATOR 6.5 (GLOVE) ×1
GLOVE ECLIPSE 7.5 STRL STRAW (GLOVE) ×6 IMPLANT
GOWN PREVENTION PLUS LG XLONG (DISPOSABLE) ×6 IMPLANT
GOWN STRL REIN XL XLG (GOWN DISPOSABLE) ×6 IMPLANT
NDL HYPO 25X1 1.5 SAFETY (NEEDLE) IMPLANT
NEEDLE HYPO 25X1 1.5 SAFETY (NEEDLE) IMPLANT
NS IRRIG 1000ML POUR BTL (IV SOLUTION) ×4 IMPLANT
PACK LAVH (CUSTOM PROCEDURE TRAY) ×4 IMPLANT
PAD OB MATERNITY 4.3X12.25 (PERSONAL CARE ITEMS) ×4 IMPLANT
PROTECTOR NERVE ULNAR (MISCELLANEOUS) ×4 IMPLANT
SET IRRIG TUBING LAPAROSCOPIC (IRRIGATION / IRRIGATOR) ×3 IMPLANT
SOLUTION ELECTROLUBE (MISCELLANEOUS) ×4 IMPLANT
SPONGE LAP 18X18 X RAY DECT (DISPOSABLE) ×11 IMPLANT
STAPLER VISISTAT 35W (STAPLE) ×4 IMPLANT
SUT VIC AB 0 CT1 18XCR BRD8 (SUTURE) ×9 IMPLANT
SUT VIC AB 0 CT1 27 (SUTURE) ×12
SUT VIC AB 0 CT1 27XBRD ANBCTR (SUTURE) ×10 IMPLANT
SUT VIC AB 0 CT1 8-18 (SUTURE) ×12
SUT VIC AB 2-0 CT1 27 (SUTURE) ×4
SUT VIC AB 2-0 CT1 TAPERPNT 27 (SUTURE) ×2 IMPLANT
SUT VIC AB 3-0 X1 27 (SUTURE) ×4 IMPLANT
SUT VICRYL 0 TIES 12 18 (SUTURE) ×4 IMPLANT
SUT VICRYL 1 TIES 12X18 (SUTURE) ×4 IMPLANT
SYR CONTROL 10ML LL (SYRINGE) ×3 IMPLANT
TOWEL OR 17X24 6PK STRL BLUE (TOWEL DISPOSABLE) ×8 IMPLANT
TRAY FOLEY CATH 14FR (SET/KITS/TRAYS/PACK) ×4 IMPLANT
WARMER LAPAROSCOPE (MISCELLANEOUS) ×4 IMPLANT

## 2012-04-09 NOTE — Anesthesia Postprocedure Evaluation (Signed)
Anesthesia Post Note  Patient: Katelyn Bailey  Procedure(s) Performed: Procedure(s) (LRB): HYSTERECTOMY ABDOMINAL (N/A)  Anesthesia type: General  Patient location: PACU  Post pain: Pain level controlled  Post assessment: Post-op Vital signs reviewed  Last Vitals:  Filed Vitals:   04/09/12 0925  BP:   Pulse: 57  Temp:   Resp: 16    Post vital signs: Reviewed  Level of consciousness: sedated  Complications: No apparent anesthesia complicationsfj

## 2012-04-09 NOTE — Anesthesia Procedure Notes (Signed)
Procedure Name: Intubation Date/Time: 04/09/2012 7:28 AM Performed by: Graciela Husbands Pre-anesthesia Checklist: Suction available, Emergency Drugs available, Timeout performed, Patient identified and Patient being monitored Patient Re-evaluated:Patient Re-evaluated prior to inductionOxygen Delivery Method: Circle system utilized Preoxygenation: Pre-oxygenation with 100% oxygen Intubation Type: IV induction Ventilation: Mask ventilation without difficulty and Oral airway inserted - appropriate to patient size Laryngoscope Size: Mac and 3 Grade View: Grade I Tube type: Oral Tube size: 7.0 mm Number of attempts: 1 Airway Equipment and Method: Stylet Placement Confirmation: ETT inserted through vocal cords under direct vision,  breath sounds checked- equal and bilateral and positive ETCO2 Secured at: 20 cm Tube secured with: Tape Dental Injury: Teeth and Oropharynx as per pre-operative assessment

## 2012-04-09 NOTE — Transfer of Care (Signed)
Immediate Anesthesia Transfer of Care Note  Patient: Katelyn Bailey  Procedure(s) Performed: Procedure(s) (LRB) with comments: HYSTERECTOMY ABDOMINAL (N/A)  Patient Location: PACU  Anesthesia Type: General  Level of Consciousness: awake, alert  and oriented  Airway & Oxygen Therapy: Patient Spontanous Breathing and Patient connected to nasal cannula oxygen  Post-op Assessment: Report given to PACU RN and Post -op Vital signs reviewed and stable  Post vital signs: Reviewed and stable  Complications: No apparent anesthesia complications

## 2012-04-09 NOTE — Brief Op Note (Signed)
04/09/2012  8:43 AM  PATIENT:  Katelyn Bailey  46 y.o. female  PRE-OPERATIVE DIAGNOSIS:  Pelvic Pain, Fibroids, and Dysfunctional Uterine Bleeding  POST-OPERATIVE DIAGNOSIS:  Pelvic Pain, Fibroids, and Dysfunctional Uterine Bleeding  PROCEDURE:  Procedure(s) (LRB) with comments: HYSTERECTOMY ABDOMINAL (N/A)  SURGEON:  Surgeon(s) and Role:    * Miguel Aschoff, MD - Primary    * W Lodema Hong, MD - Assisting  ANESTHESIA:   general  EBL:  Total I/O In: 1000 [I.V.:1000] Out: 275 [Urine:200; Blood:75]  BLOOD ADMINISTERED:none  DRAINS: on Q drain   LOCAL MEDICATIONS USED:  LIDOCAINE   SPECIMEN:  Source of Specimen:  uterus  DISPOSITION OF SPECIMEN:  PATHOLOGY  COUNTS:  YES  TOURNIQUET:  * No tourniquets in log *  DICTATION: .Other Dictation: Dictation Number [id number]  PLAN OF CARE: Admit to inpatient   PATIENT DISPOSITION:  PACU - hemodynamically stable.   Delay start of Pharmacological VTE agent (>24hrs) due to surgical blood loss or risk of bleeding: PAS hose placed

## 2012-04-09 NOTE — Anesthesia Preprocedure Evaluation (Signed)
Anesthesia Evaluation  Patient identified by MRN, date of birth, ID band Patient awake    Reviewed: Allergy & Precautions, H&P , NPO status , Patient's Chart, lab work & pertinent test results, reviewed documented beta blocker date and time   Airway Mallampati: I TM Distance: >3 FB Neck ROM: full    Dental  (+) Teeth Intact   Pulmonary neg pulmonary ROS,  breath sounds clear to auscultation  Pulmonary exam normal       Cardiovascular Exercise Tolerance: Good Rhythm:regular Rate:Normal     Neuro/Psych  Headaches,  Neuromuscular disease (occipital neuralgia, on gabapentin daily) negative psych ROS   GI/Hepatic negative GI ROS, Neg liver ROS, hiatal hernia,   Endo/Other  negative endocrine ROS  Renal/GU Renal disease (h/o kidney stone)negative Renal ROS  Female GU complaint     Musculoskeletal   Abdominal   Peds  Hematology negative hematology ROS (+)   Anesthesia Other Findings   Reproductive/Obstetrics negative OB ROS                           Anesthesia Physical Anesthesia Plan  ASA: I  Anesthesia Plan: General ETT   Post-op Pain Management:    Induction:   Airway Management Planned:   Additional Equipment:   Intra-op Plan:   Post-operative Plan:   Informed Consent: I have reviewed the patients History and Physical, chart, labs and discussed the procedure including the risks, benefits and alternatives for the proposed anesthesia with the patient or authorized representative who has indicated his/her understanding and acceptance.   Dental Advisory Given  Plan Discussed with: CRNA and Surgeon  Anesthesia Plan Comments:         Anesthesia Quick Evaluation

## 2012-04-09 NOTE — Progress Notes (Signed)
Stable this PM good pain control with the On Q unit.  V/S are stable Pulse 61  BP 10867  Post op Hg  11.6  Pre op was 12.4  Impression: Satisfactory post op course.  Reassess in AM

## 2012-04-10 ENCOUNTER — Encounter (HOSPITAL_COMMUNITY): Payer: Self-pay | Admitting: Obstetrics and Gynecology

## 2012-04-10 LAB — CBC
HCT: 32.7 % — ABNORMAL LOW (ref 36.0–46.0)
Hemoglobin: 10.8 g/dL — ABNORMAL LOW (ref 12.0–15.0)
MCH: 30.5 pg (ref 26.0–34.0)
MCHC: 33 g/dL (ref 30.0–36.0)
MCV: 92.4 fL (ref 78.0–100.0)
Platelets: 117 10*3/uL — ABNORMAL LOW (ref 150–400)
RBC: 3.54 MIL/uL — ABNORMAL LOW (ref 3.87–5.11)
RDW: 14.1 % (ref 11.5–15.5)
WBC: 7.5 10*3/uL (ref 4.0–10.5)

## 2012-04-10 MED ORDER — INFLUENZA VIRUS VACC SPLIT PF IM SUSP
0.5000 mL | INTRAMUSCULAR | Status: AC
  Administered 2012-04-11: 0.5 mL via INTRAMUSCULAR
  Filled 2012-04-10: qty 0.5

## 2012-04-10 MED ORDER — OXYCODONE-ACETAMINOPHEN 5-325 MG/5ML PO SOLN
5.0000 mL | ORAL | Status: DC | PRN
  Administered 2012-04-11: 5 mL via ORAL
  Filled 2012-04-10: qty 5

## 2012-04-10 NOTE — Progress Notes (Signed)
S: Doing well this AM. Good pain control with PCA and On Q. Has been stable throughout the night  O: Afebrile  BP 92/55  Pulse 60  Abdomen soft wound dressing dry. Positive BS  Lab  Hgb  10.8  WBC 7.5  A: Satisfactory post op course.  Plan:  D/C foley             Advance diet           Ambulate

## 2012-04-10 NOTE — Progress Notes (Signed)
Ur chart review completed.  

## 2012-04-10 NOTE — Op Note (Signed)
NAME:  Katelyn Bailey, Katelyn Bailey NO.:  [id number]  MEDICAL RECORD NO.:  [id number]  LOCATION:  9311                          FACILITY:  [facility]  PHYSICIAN:  Miguel Aschoff, M.D.       DATE OF BIRTH:  October 18, 1965  DATE OF PROCEDURE:  04/09/2012 DATE OF DISCHARGE:                              OPERATIVE REPORT   PREOPERATIVE DIAGNOSIS:  Menorrhagia, pelvic pain, uterine fibroids.  POSTOPERATIVE DIAGNOSIS:  Menorrhagia, pelvic pain, uterine fibroids.  PROCEDURE:  Total abdominal hysterectomy.  SURGEON:  Miguel Aschoff, M.D.  ASSISTANT:  Dr. Lodema Hong.  ANESTHESIA:  General.  COMPLICATIONS:  None.  JUSTIFICATION:  The patient is a 46 year old white female with history of progressively heavier menses and pelvic pain.  On evaluation, the patient was noted to have uterine fibroids approximately 10-12 weeks equivalent size because of these fibroids and symptoms associated with them and her desire for definitive treatment.  She is being brought to the operating room at this time to undergo a hysterectomy.  The risks and benefits have been discussed with the patient.  Informed consent has been obtained.  PROCEDURE IN DETAIL:  The patient was taken to the operating room, placed in supine position.  General anesthesia was administered without difficulty.  Under anesthesia examination was carried out.  At this point, the exam revealed fibroids approximately 10-12 weeks equivalent size because of her size and history of previous C-section.  Gastric bypass was felt safer to proceed with abdominal hysterectomy rather than laparoscopic hysterectomy.  At this point, she was prepped and draped in usual sterile fashion.  Foley catheter was inserted.  A Pfannenstiel incision was then made extended down through the subcutaneous tissue with bleeding points being clamped and coagulated as they were encountered.  The fascia was then identified and incised transversely and separated from the  underlying rectus muscles.  Rectus muscles were divided in the midline.  The peritoneum was then found and entered carefully avoiding underlying structures.  The peritoneal incision was then extended.  At this point, self-retaining retractors was placed through the wound and the viscera packed out of the pelvis.  The uterus was inspected again it was approximately 10-12 weeks equivalent size with subserosal fibroids and 1 large posterior myoma on the left.  The ovaries and tubes were within normal limits except for a history of prior tubal sterilization.  At this point, the utero-ovarian ligaments were clamped with Kelly clamps.  Uterus elevated and the round ligaments were then identified and tied and divided and the bladder flap was created anteriorly.  At this point, the perforation was made below the utero-ovarian ligament.  Heaney clamps were placed across this ligament. The pedicles were cut and suture ligated using suture ligatures of 0 Vicryl and free ties of 0 Vicryl.  At this point, broad ligament was skeletonized.  The uterine vessels was found.  The uterine was clamped with curved Heaney clamps.  These pedicles were cut and suture ligated using suture ligatures of 0 Vicryl.  Bladder flap was further developed and taken down below the cervix.  Then, using straight Heaney clamps a paracervical fascia was clamped, cut, and suture ligated using suture ligatures of  0 Vicryl.  Then in serial fashion, the uterosacral ligaments and cardinal ligaments were clamped, cut, and suture ligated. All using suture ligatures, 0 Vicryl and curved Heaney clamps.  At this point, it is possible placed Heaney clamps across the reflection of the cervix and the vagina.  These pedicles were cut and the specimen was freed specimen consisting of the cervix, uterus, and the fibroids.  The angles of the vaginal cuff were then closed using figure-of-eight sutures and 0 Vicryl.  Then, the cuff was closed  using interrupted 0 Vicryl sutures.  Once this was done, inspection was made for hemostasis. Hemostasis appeared to be excellent.  The peritoneum was then closed using running continuous 2-0 Vicryl suture, placing pedicles in retroperitoneal positions.  At this point, all instruments were removed as well as lap pad.  Counts were taken and found to be correct and then the abdomen was closed.  The parietal peritoneum was closed using running continuous 0 Vicryl suture.  Rectus muscles were reapproximated using running continuous 0 Vicryl suture.  The fascia was closed using 2 sutures of 0 Vicryl, each starting at lateral fashion with angles meeting in the midline.  At this point, the ON-Q subcutaneous catheters were placed to infuse Marcaine for postoperative analgesia.  Once these catheters were placed, 1 in the right portion of the incision and 1 in the left and the incision was further closed using with interrupted 0 Vicryl sutures.  Then, the skin incision was closed using subcuticular 4- 0 Vicryl.  Estimated blood loss from the procedure was approximately 75 mL.  The patient tolerated the procedure well and went to the recovery room satisfactory condition.  Plan was for the patient to be admitted for postoperative care.     Miguel Aschoff, M.D.     AR/MEDQ  D:  04/09/2012  T:  04/10/2012  Job:  [id number]

## 2012-04-11 NOTE — Progress Notes (Signed)
S: Doing well but sad. Today anniversary of sons death. Has been taking po. Still on the PCA.  O: Afebrile  BP 136/85    Pulse 90  Abdomen is soft wound healing well. On Q removed without problem.  Assessment: Stable post op. Will see how she does today off PCA. Probable D/C on 9/13

## 2012-04-11 NOTE — Progress Notes (Signed)
S: Feeling better this afternoon and wants to go home. Doing well of PCA and on PO meds  Afebrile  BP 115/73  Abdomen soft would is clean and dry  Pathology benign fibroids  Assessment: Excellent post op course  D/C home Resume prior meds Call for any problems such as fever severe pain or heavy bleeding Regular diet Follow up visit in 4 weeks

## 2012-04-16 NOTE — Discharge Summary (Signed)
NAME:  Katelyn Bailey, KENG NO.:  [id number]  MEDICAL RECORD NO.:  [id number]  LOCATION:  9311                          FACILITY:  [facility]  PHYSICIAN:  Miguel Aschoff, M.D.       DATE OF BIRTH:  [birth year]  DATE OF ADMISSION:  04/09/2012 DATE OF DISCHARGE:  04/11/2012                              DISCHARGE SUMMARY   ADMISSION DIAGNOSES:  Uterine fibroids and menorrhagia.  FINAL DIAGNOSES:  Uterine fibroids and menorrhagia.  OPERATIONS AND PROCEDURES:  Total abdominal hysterectomy.  BRIEF HISTORY:  The patient is a 46 year old white female with history of progressively heavier menses.  On evaluation in the office, the patient was noted to have significant uterine fibroids, equivalent in size to 10-12 weeks of pregnancy.  Because of the continued heavy bleeding and the patient's desire to undergo definitive procedure to eliminate the bleeding once overall, she was admitted to the hospital to undergo total abdominal hysterectomy.  HOSPITAL COURSE:  Preoperative studies were obtained.  Chemistry profile was essentially within normal limits.  PT and PTT were within normal limits.  Urinalysis was unremarkable.  Admission hemoglobin was 13.1. Under general anesthesia on April 09, 2012, a total abdominal hysterectomy was carried out without difficulty.  The findings at surgery revealed a large fibroid uterus with significant subserosal and intramural fibroids.  The patient tolerated the surgical procedure well. The postoperative course was essentially uncomplicated.  She did tolerate increasing ambulation and diet well.  Her hemoglobin dropped from her preoperative level to a nadir of 10.8.  Her white count remained stable.  On the second postoperative day, she was felt to be stable enough to be discharged home.  Medications for home included Roxicet liquid 5 mL every 3-4 hours as needed for pain.  The patient was instructed to resume her prior medications.  She was told to  place nothing in the vagina, to call if there were any problems such as fever, pain, or heavy bleeding.  She was sent home on a regular diet and instructed to make a postop visit for 4 weeks.     Miguel Aschoff, M.D.     AR/MEDQ  D:  04/15/2012  T:  04/16/2012  Job:  [id number]

## 2012-06-21 HISTORY — PX: OTHER SURGICAL HISTORY: SHX169

## 2012-07-16 ENCOUNTER — Ambulatory Visit (INDEPENDENT_AMBULATORY_CARE_PROVIDER_SITE_OTHER): Payer: BC Managed Care – PPO | Admitting: Adult Health

## 2012-07-16 ENCOUNTER — Encounter: Payer: Self-pay | Admitting: Adult Health

## 2012-07-16 VITALS — BP 102/68 | HR 101 | Temp 98.1°F | Ht 61.0 in | Wt 114.0 lb

## 2012-07-16 DIAGNOSIS — N3000 Acute cystitis without hematuria: Secondary | ICD-10-CM

## 2012-07-16 MED ORDER — CIPROFLOXACIN HCL 250 MG PO TABS: 250.0000 mg | ORAL_TABLET | Freq: Two times a day (BID) | ORAL | Status: DC

## 2012-07-16 NOTE — Progress Notes (Signed)
Subjective:    Patient ID: Katelyn Bailey, female    DOB: Mar 23, 1966, 46 y.o.   MRN: [id number]  HPI 46 y/o WF mult med problems including:  HBP, Ven Insuffic;  Hyperchol;  Obesity;  HH/ GERD; and Anxiety...   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    On Simva40 w/ good numbers 6/12; now weight down further & she is anxious to stop the med & see how the FLP looks on her new diet alone; f/u FLP 65mo...    On Protonix 40mg /d & she denies GERD symptoms, dysphagia, reflux, LER, etc;  OK to wean this med to Qod & off as long as she remains asymptomatic...  ~  July 07, 2011:  65mo ROV & Katelyn Bailey is feeling well- no new complaints or concerns;  She has lost a further 27# to 156# today & she is very pleased;  BP has been running sl low on her LisinoprilHCT 1/2 tab daily & we discussed stopping this med now & following BP at home;  Already off statin Rx & FLP looks great now on diet alone;  Chems normal, CBC w/ mild anemia Hg=11.3/ Fe=28 (7%sat) and advised to start Fe 325mg  + VitC 500mg  daily (we will recheck lab & if no better she may need the Fe IV);  B12, Folate/ Mag are all ok...  ~  September 11, 2011:  80mo ROV & add-on for occipital area pain> c/o left neck/ occipital/ post head pain that comes & goes w/o known ppt cause; present for several weeks, occurs 2+ times daily, pain is severe rated 7-8/10 & lasts 10-53min 7 resolves spont (no meds needed); denies visual symptoms,  N/V, etc; she has never had similar pain;  Exam shows sl tender over left occiput, full ROM neck, no neuro findings, etc...  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    NOTE: she had f/u FASTING blood work today> FLP looks great, BS normal> all on diet alone; just dec Ferritin= 5.1 & rec to continue FeSO4 daily for now...  ~  January 05, 2012:  56mo ROV & [place] looks great, now 127# down from 224# [id number] & most of her medical problems have resolved w/ the 100# wt loss after her Bariatric surg...  SEE UPDATED PROB LIST BELOW>>    Her CC continues to be her occipital headaches, c/w occip neuralgia, & she's been eval by [facility]; she's had shots which help for awhile & is currently on NEURONTIN 600mg /d- improved but HAs persist 7 she has f/u appt soon...  07/16/2012 Acute OV  Complains of running fever  of 102, chills and body aches since 07/13/12. Thinks she might have a UTI.Marland Kitchen  Feels very achy all over. Urine is very cloudy. Mild dysuria. Some low back pain and aches  No UTI recently . >10 years  Had Hysterectomy in 03/2012 .  Husband had flu like symptoms 3 weeks ago  No cough, congestion or sore throat.           Problem List:  Hx of ESSENTIAL HYPERTENSION >> RESOLVED w/ weight loss after bariatric surg...  prev on Lisinopril/Hct 20-12.5 & weaned off Rx w/ normal BP off meds now... ~  CXR 1/12 showed normal heart size, clear lungs... ~  EKG 1/12 showed NSR,  NSSTTWA... ~  9/12:  BP= 124/84 today on 1/2 of the LisinoprilHCT & similar at home, & we discussed continuing same for now... denies HA, visual changes, CP, palipit, dizziness, syncope, dyspnea, edema, etc... ~  12/12:  BP= 118/62 & even lower at home; she is anxious to stop  the med & seen how she does monitoring BP at home.. ~  2/13:  BP= 122/84 off all meds, great job! ~  6/13:  BP= 110/68  VENOUS INSUFFICIENCY, LEGS (ICD-459.81) - follows a low sodium diet, & we discussed elevation & support hose to minimize any swelling...  Hx of HYPERCHOLESTEROLEMIA >> prev on Simvastatin 40mg /d & now off all meds after bariatric surg... 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 ~  FLP 12/12 on diet alone showed TChol 152, TG 52, HDL 40, LDL 102 ~  FLP 2/13 on diet alone showed TChol 152, TG 47, HDL 40, LDL 103... Ok on diet alone, off meds.  Hx of OVERWEIGHT (ICD-278.02) >>  ~  Past Hx from old chart> 200# in 2001; 207# in '02; 210# in '05; 219# in '07; & 211# 8/08 ~  weight 5/09 = down to 175# with all the stress (84 y/o son died suddenly 2023-05-05 ?cause) ~  weight 1/11 = 205#... on weight watchers now. ~  weight 9/11 = 222#... she is 87" tall, & BMI= 42. ~  weight 1/12 = 224#.Marland KitchenMarland Kitchen we reviewed low carb, low fat, low salt diet. ~  weight 2/12 = 219#... Insurance co required 73mo medically supervised wt loss program. 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 BMI= 29 ~  Weight 2/13 = 143#... 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 ACID REFLUX DISEASE (ICD-530.81) - 8/08 eval w/ LER, improved  w/ PROTONIX 40/d... ~  UGI series post bypass surg> normal w/o anastomotic leak etc... ~  9/12:  OK to try to wean the Protonix off, titrate to any symptoms...  HEADACHES >> Hx c/w occipital Neuralgia & we tried Topamax 2/13 w/o relief;  [facility] & she received series of shots in occipital area w/ temp relief; now on NEURONTIN 600mg /d & they are titrating up the dose... DIZZINESS (ICD-780.4) - uses meclizine as needed.  ANXIETY (ICD-300.00) - on ALPRAZOLAM 0.5mg  Qhs Prn... ~  04-09-2023:  tragically her 52 y/o son died in his sleep 2023-04-09 and autopsy showed no apparent cause, he was healthy without medical problems... she lost 35 lbs w/ the stress... she is getting counselling thru hospice... she has a 63 y/o son at home.  ANEMIA >> started on Fe 325mg /d + VitC 500mg /d + Vit B12 543mcg/d... ~  Labs 8/12 showed Hg= 11.0, /mcv= 88 ~  Labs 12/12 showed Hg= 11.3, MCV= 86, Fe= 28 (7%sat).. rec to start Fe+VitC... ~  Labs 2/13 on Fe daily showed Hg= 12.4, MCV= 90, Fe= 63 (17%), Ferritin= 5.1 (10-291), B12= 752  Health Maintenance: ~  GI:  given stool cards to check for hidden blood; otherwise colonoscopy not due til after age 33... ~  GYN:  DrSilva- with PAP & Mammogram each spring... ~  Immunizations:  she gets the yearly Flu vaccines each autumn;  Given TDAP here 6/13;  she will not require a Pneumonia vaccine until age 58, unless the current recommendations change...   Past Surgical History  Procedure Date  . Tubal ligation 1997  . Cesarean section 1990  . Roux-en-y gastric bypass 03/20/2011  . Abdominal hysterectomy 04/09/2012    Procedure: HYSTERECTOMY ABDOMINAL;  Surgeon: Miguel Aschoff, MD;  Location: [facility];  Service: Gynecology;  Laterality: N/A;    Outpatient Encounter Prescriptions as of 07/16/2012  Medication Sig Dispense Refill  . CALCIUM  CITRATE-VITAMIN D PO Take 1 tablet by mouth 3 (three) times daily.      . Cyanocobalamin (VITAMIN B-12 PO) Take 1 tablet by mouth daily.      Marland Kitchen gabapentin (NEURONTIN) 600 MG tablet Take 600 mg by mouth daily.      . IRON PO Take 325 mg by mouth daily.       . Multiple Vitamin (MULTIVITAMIN WITH MINERALS) TABS Take 1 tablet by mouth 2 (two) times daily.        No Known Allergies   Current Medications, Allergies, Past Medical History, Past Surgical History, Family History, and Social History were reviewed in [employer] record.    Review of Systems       Constitutional:   No  weight loss, night sweats,  + Fevers, chills, fatigue, or  lassitude.  HEENT:   No headaches,  Difficulty swallowing,  Tooth/dental problems, or  Sore throat,                No sneezing, itching, ear ache, nasal congestion, post nasal drip,   CV:  No chest pain,  Orthopnea, PND, swelling in lower extremities, anasarca, dizziness, palpitations, syncope.   GI  No heartburn, indigestion, abdominal pain, nausea, vomiting, diarrhea, change in bowel habits, loss of appetite, bloody stools.   Resp: No shortness of breath with exertion or at rest.  No excess mucus, no productive cough,  No non-productive cough,  No coughing up of blood.  No change in color of mucus.  No wheezing.  No chest wall deformity  Skin: no rash or lesions.  GU: no  no urgency or frequency.   no hematuria   MS:  No joint pain or swelling.  No decreased range of motion.  No back pain.  Psych:  No change in mood or affect. No depression or anxiety.  No memory loss.      Objective:   Physical Exam    WD, overweight, 46 y/o WF  GENERAL:  Alert & oriented; pleasant & cooperative. HEENT:  [place]/AT,  EACs-clear, TMs-wnl, NOSE-clear, THROAT-clear & wnl. NECK:  Supple w/ full ROM; no JVD; normal carotid impulses w/o bruits; no thyromegaly or nodules palpated; no lymphadenopathy. CHEST:  Clear to P & A; without wheezes/  rales/ or rhonchi. HEART:  Regular Rhythm; without murmurs/ rubs/ or gallops. ABDOMEN:  Soft & nontender, normal bowel sounds; no organomegaly or masses detected. No CVA tenderness , no guarding or rebound  EXT: without deformities or arthritic changes; no varicose veins/ venous insuffic/ or edema.  DERM:  no lesions seen, no rashes, etc...    UDIP : + leuko, blood and nitrates  Assessment & Plan:

## 2012-07-16 NOTE — Addendum Note (Signed)
Addended by: Caryl Ada on: 07/16/2012 12:28 PM   Modules accepted: Orders

## 2012-07-16 NOTE — Patient Instructions (Addendum)
Cipro 250mg  Twice daily  For 7 days, take with food.  Push fluids  Please contact office for sooner follow up if symptoms do not improve or worsen or seek emergency care

## 2012-07-16 NOTE — Assessment & Plan Note (Signed)
Acute UTI w/ + udip  Send urine cx   Plan   Cipro x 7 days  Push fluid  Tylenol As needed   UTI prevention discussed  Please contact office for sooner follow up if symptoms do not improve or worsen or seek emergency care

## 2012-07-18 LAB — URINE CULTURE: Colony Count: >=100000

## 2012-07-19 NOTE — Progress Notes (Signed)
Quick Note:  LMOM TCB x1. ______ 

## 2012-07-22 ENCOUNTER — Telehealth: Payer: Self-pay | Admitting: Adult Health

## 2012-07-22 NOTE — Telephone Encounter (Signed)
Result Note     UTI sensitive to cipro    Cont on current ov recs    Please contact office for sooner follow up if symptoms do not improve or worsen or seek emergency care    -------  I spoke with patient about results and she verbalized understanding and had no questions

## 2012-09-06 ENCOUNTER — Ambulatory Visit: Payer: BC Managed Care – PPO | Admitting: Pulmonary Disease

## 2012-10-14 ENCOUNTER — Ambulatory Visit (INDEPENDENT_AMBULATORY_CARE_PROVIDER_SITE_OTHER): Payer: BC Managed Care – PPO | Admitting: Pulmonary Disease

## 2012-10-14 ENCOUNTER — Encounter: Payer: Self-pay | Admitting: Pulmonary Disease

## 2012-10-14 VITALS — BP 122/84 | HR 54 | Temp 98.0°F | Ht 61.0 in | Wt 117.8 lb

## 2012-10-14 DIAGNOSIS — R51 Headache: Secondary | ICD-10-CM

## 2012-10-14 DIAGNOSIS — F411 Generalized anxiety disorder: Secondary | ICD-10-CM

## 2012-10-14 DIAGNOSIS — Z9884 Bariatric surgery status: Secondary | ICD-10-CM

## 2012-10-14 DIAGNOSIS — D649 Anemia, unspecified: Secondary | ICD-10-CM

## 2012-10-14 DIAGNOSIS — G8929 Other chronic pain: Secondary | ICD-10-CM | POA: Insufficient documentation

## 2012-10-14 DIAGNOSIS — K449 Diaphragmatic hernia without obstruction or gangrene: Secondary | ICD-10-CM

## 2012-10-14 MED ORDER — GABAPENTIN 800 MG PO TABS: 800.0000 mg | ORAL_TABLET | Freq: Every day | ORAL | Status: DC

## 2012-10-14 MED ORDER — ALPRAZOLAM 0.5 MG PO TABS: ORAL_TABLET | ORAL | Status: DC

## 2012-10-14 NOTE — Progress Notes (Signed)
Subjective:    Patient ID: Katelyn Bailey, female    DOB: 1965/08/03, 47 y.o.   MRN: [id number]  HPI 47 y/o WF here for a follow up visit... she has mult med problems including:  HBP, Ven Insuffic;  Hyperchol;  Obesity;  HH/ GERD; and Anxiety...   ~  July 07, 2011:  11mo ROV & Shaneice is feeling well- no new complaints or concerns;  She has lost a further 27# to 156# today & she is very pleased;  BP has been running sl low on her LisinoprilHCT 1/2 tab daily & we discussed stopping this med now & following BP at home;  Already off statin Rx & FLP looks great now on diet alone;  Chems normal, CBC w/ mild anemia Hg=11.3/ Fe=28 (7%sat) and advised to start Fe 325mg  + VitC 500mg  daily (we will recheck lab & if no better she may need the Fe IV);  B12, Folate/ Mag are all ok...  ~  September 11, 2011:  38mo ROV & add-on for occipital area pain> c/o left neck/ occipital/ post head pain that comes & goes w/o known ppt cause; present for several weeks, occurs 2+ times daily, pain is severe rated 7-8/10 & lasts 10-12min 7 resolves spont (no meds needed); denies visual symptoms, N/V, etc; she has never had similar pain;  Exam shows sl tender over left occiput, full ROM neck, no neuro findings, etc...  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    NOTE: she had f/u FASTING blood work today> FLP looks great, BS normal> all on diet alone; just dec Ferritin= 5.1 & rec to continue FeSO4 daily for now...  ~  January 05, 2012:  24mo ROV & Tonnie looks great, now 127# down from 224# [id number] & most of her medical problems have resolved w/ the 100# wt loss after her Bariatric surg...  SEE UPDATED PROB LIST BELOW>>    Her CC continues to be her occipital headaches, c/w occip  neuralgia, & she's been eval by [facility]; she's had shots which help for awhile & is currently on NEURONTIN 600mg /d- improved but HAs persist 7 she has f/u appt soon...  ~  October 14, 2012:  51mo ROV & Shaquia continues to do well; she has lost down to 118# & is trying to put a few back on w/ protein shakes, etc... We reviewed the following medical problems during today's office visit >>    Hx HBP> off meds since gastric surg- BP= 122/84 on diet alone now & she denies CP, palpit, dizzy, SOB, edema, etc...    Ven Insuffic> she knows to elim salt, elev legs, wear support hose prn etc...    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 (yearly check up 7/13- doing well); peak wt was 224# in 2011 (BMI=42), lost to 202# on diet prior to surg, now down to 118# & BMI=22...    GI- HH, GERD> she uses OTC Prilosec20 prn but doing well w/o abd pain, dysphagia, n/v, c/d, blood seen.Marland KitchenMarland KitchenMarland Kitchen  UTI> TP treated a UTI (sens EColi) 12/13 w/ Cipro & resolved...    GYN> she had a TAH by [facility] 9/13 for menorrhagia & fibroids...    HAs> on Neurontin 800mg /d; she reports that this works well for prevention at this dose...    Anxiety> on Alpraz0.5mg  prn; in Dec 29, 2006 her 18y/o son passed away in his sleep w/o explanation...    Hx Anemia> on Fe tab daily; post hyst Hg~11 range; f/u labs pending... We reviewed prob list, meds, xrays and labs> see below for updates >>  She will return for FASTING blood work => pending...          Problem List:  Hx of ESSENTIAL HYPERTENSION >> RESOLVED w/ weight loss after bariatric surg...  prev on Lisinopril/Hct 20-12.5 & weaned off Rx w/ normal BP off meds now... ~  CXR 1/12 showed normal heart size, clear lungs... ~  EKG 1/12 showed NSR,  NSSTTWA... ~  9/12:  BP= 124/84 today on 1/2 of the LisinoprilHCT & similar at home, & we discussed  continuing same for now... denies HA, visual changes, CP, palipit, dizziness, syncope, dyspnea, edema, etc... ~  12/12:  BP= 118/62 & even lower at home; she is anxious to stop the med & seen how she does monitoring BP at home.. ~  2/13:  BP= 122/84 off all meds, great job! ~  6/13:  BP= 110/68 ~  3/14: off meds since gastric surg- BP= 122/84 on diet alone now & she denies CP, palpit, dizzy, SOB, edema, etc...  VENOUS INSUFFICIENCY, LEGS (ICD-459.81) - follows a low sodium diet, & we discussed elevation & support hose to minimize any swelling...  Hx of HYPERCHOLESTEROLEMIA >> prev on Simvastatin 40mg /d & now off all meds after bariatric surg... 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 ~  FLP 12/12 on diet alone showed TChol 152, TG 52, HDL 40, LDL 102 ~  FLP 2/13 on diet alone showed TChol 152, TG 47, HDL 40, LDL 103... Ok on diet alone, off meds. ~  FLP 7/13 on diet alone showed TChol 164, TG 78, HDL 52, LDL 96   Hx of OVERWEIGHT (ICD-278.02) >>  ~  Past Hx from old chart> 200# in 12/29/99; 207# in '02; 210# in '05; 219# in '07; & 211# 8/08 ~  weight 5/09 = down to 175# with all the stress (52 y/o son died suddenly Apr 28, 2023 ?cause) ~  weight 1/11 = 205#... on weight watchers now. ~  weight 9/11 = 222#... she is 24" tall, & BMI= 42. ~  weight 1/12 = 224#.Marland KitchenMarland Kitchen we reviewed low carb, low fat, low salt diet. ~  weight 2/12 = 219#... Insurance co required 11mo  medically supervised wt loss program. 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 BMI= 29 ~  Weight 2/13 = 143#... 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 ACID REFLUX DISEASE (ICD-530.81) - 8/08 eval w/ LER, improved w/ PROTONIX 40/d... ~  UGI series post bypass surg> normal w/o anastomotic leak etc... ~  9/12:  OK to try to wean the Protonix off, titrate to any symptoms...  S/P TAH 9/13 by DrARoss >> for Menorrhagia & Fibroids... ~  Post op Hg stable in the 11 range...  HEADACHES >> Hx c/w occipital Neuralgia & we tried Topamax 2/13 w/o relief;  [facility] & she received series of shots in occipital area w/ temp relief; now on NEURONTIN 800mg /d & this dose controls her HAs... DIZZINESS (ICD-780.4) - uses meclizine as needed.  ANXIETY (ICD-300.00) - on ALPRAZOLAM 0.5mg  Qhs Prn... ~  2023/05/07:  tragically her 66 y/o son died in his sleep May 07, 2023 and autopsy showed no apparent cause, he was healthy without medical problems... she lost 35 lbs w/ the stress... she is getting counselling thru hospice... she has a 70 y/o son at home.  ANEMIA >> started on Fe 325mg /d + VitC 500mg /d + Vit B12 567mcg/d... ~  Labs 8/12 showed Hg= 11.0, /mcv= 88 ~  Labs 12/12 showed Hg= 11.3, MCV= 86, Fe= 28 (7%sat).. rec to start Fe+VitC... ~  Labs 2/13 on Fe daily showed Hg= 12.4, MCV= 90, Fe= 63 (17%), Ferritin= 5.1 (10-291), B12= 752  Health Maintenance: ~  GI:  S/p gastric bypass surg 8/12 ; given stool cards to check for hidden blood; otherwise colonoscopy not due til after age 64... ~  GYN:  DrSilva- with PAP & Mammogram each spring => s/p Hysterectomy 9/13 by DrRoss... ~  Immunizations:  she gets the yearly Flu vaccines  each autumn;  Given TDAP here 6/13;  she will not require a Pneumonia vaccine until age 26, unless the current recommendations change...   Past Surgical History  Procedure Laterality Date  . Tubal ligation  1997  . Cesarean section  1990  . Roux-en-y gastric bypass  03/20/2011  . Abdominal hysterectomy  04/09/2012    Procedure: HYSTERECTOMY ABDOMINAL;  Surgeon: Miguel Aschoff, MD;  Location: [facility];  Service: Gynecology;  Laterality: N/A;  . Skin carcinoma  06/21/2012    removed from upper lip    Outpatient Encounter Prescriptions as of 10/14/2012  Medication Sig Dispense Refill  . CALCIUM CITRATE-VITAMIN D PO Take 1 tablet by mouth 3 (three) times daily.      . Cyanocobalamin (VITAMIN B-12 PO) Take 1 tablet by mouth daily.      Marland Kitchen gabapentin (NEURONTIN) 600 MG tablet Take 800 mg by mouth daily.       . IRON PO Take 325 mg by mouth daily.       . Multiple Vitamin (MULTIVITAMIN WITH MINERALS) TABS Take 1 tablet by mouth 2 (two) times daily.      . [DISCONTINUED] ciprofloxacin (CIPRO) 250 MG tablet Take 1 tablet (250 mg total) by mouth 2 (two) times daily.  14 tablet  0   No facility-administered encounter medications on file as of 10/14/2012.    No Known Allergies   Current Medications,  Allergies, Past Medical History, Past Surgical History, Family History, and Social History were reviewed in [employer] record.    Review of Systems       See HPI - all other systems neg except as noted...  The patient notes steady wt reduction s/p gastic bypass surg 8/12;  She denies anorexia, fever, vision loss, decreased hearing, hoarseness, chest pain, syncope, dyspnea on exertion, peripheral edema, prolonged cough, headaches, hemoptysis, abdominal pain, melena, hematochezia, severe indigestion/heartburn, hematuria, incontinence, muscle weakness, suspicious skin lesions, transient blindness, difficulty walking, depression, abnormal bleeding, enlarged lymph nodes, and angioedema.      Objective:   Physical Exam    WD, overweight, 47 y/o WF in NAD.Marland KitchenMarland Kitchen 207#, 61" tall, BMI=39 GENERAL:  Alert & oriented; pleasant & cooperative. HEENT:  [place]/AT, EOM-full, PERRLA, EACs-clear, TMs-wnl, NOSE-clear, THROAT-clear & wnl. NECK:  Supple w/ full ROM; no JVD; normal carotid impulses w/o bruits; no thyromegaly or nodules palpated; no lymphadenopathy. CHEST:  Clear to P & A; without wheezes/ rales/ or rhonchi. HEART:  Regular Rhythm; without murmurs/ rubs/ or gallops. ABDOMEN:  Soft & nontender, 5 sm incisions from Lap surg; normal bowel sounds; no organomegaly or masses detected. EXT: without deformities or arthritic changes; no varicose veins/ venous insuffic/ or edema.  DERM:  no lesions seen, no rashes, etc...  RADIOLOGY DATA:  Reviewed in the EPIC EMR & discussed w/ the patient...  LABORATORY DATA:  Reviewed in the EPIC EMR & discussed w/ the patient...   Assessment & Plan:    Occipital Neuralgia>  Now followed by [facility] w/ occipital shots that helped for awhile, and on NEURONTIN being titrated=> improved...  HBP>  BP today is stable off all meds on diet alone...  CHOL>  FLP looks great off the Simva on diet alone...  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  GERD>  She is off the Protonix as well & denies abd pain, reflux, etc...  Anxiety>  She is coping well w/ the stressors in her life...  ANEMIA>  Hx mild anemia & eval revealed low Fe; supplemented orally & improved but Ferritin still low so we will continue the FeSO4 daily (f/u labs pending)...   Patient's Medications  New Prescriptions   ALPRAZOLAM (XANAX) 0.5 MG TABLET    Take 1/2 to 1 tablet by mouth three times daily as needed   GABAPENTIN (NEURONTIN) 800 MG TABLET    Take 1 tablet (800 mg total) by mouth daily.  Previous Medications   CALCIUM CITRATE-VITAMIN D PO    Take 1 tablet by mouth 3  (three) times daily.   CYANOCOBALAMIN (VITAMIN B-12 PO)    Take 1 tablet by mouth daily.   IRON PO    Take 325 mg by mouth daily.    MULTIPLE VITAMIN (MULTIVITAMIN WITH MINERALS) TABS    Take 1 tablet by mouth 2 (two) times daily.  Modified Medications   No medications on file  Discontinued Medications   CIPROFLOXACIN (CIPRO) 250 MG TABLET    Take 1 tablet (250 mg total) by mouth 2 (two) times daily.   GABAPENTIN (NEURONTIN) 600 MG TABLET    Take 800 mg by mouth daily.

## 2012-10-14 NOTE — Patient Instructions (Addendum)
Today we updated your med list in our EPIC system...    Continue your current medications the same...    We refilled your meds per request...  Please return to our lab at your earliest convenience for your FASTING blood work...    We will contact you w/ the results when available...   Call for any questions...  Keep up the great job w/ diet & exercise...  Let's plan a yearly follow up visit, but call anytime if needed for problems.Marland KitchenMarland Kitchen

## 2012-12-04 ENCOUNTER — Other Ambulatory Visit: Payer: Self-pay | Admitting: Obstetrics and Gynecology

## 2013-01-29 ENCOUNTER — Encounter (INDEPENDENT_AMBULATORY_CARE_PROVIDER_SITE_OTHER): Payer: Self-pay | Admitting: Surgery

## 2013-01-29 ENCOUNTER — Ambulatory Visit (INDEPENDENT_AMBULATORY_CARE_PROVIDER_SITE_OTHER): Payer: BC Managed Care – PPO | Admitting: Surgery

## 2013-01-29 VITALS — BP 122/78 | HR 76 | Resp 14 | Ht 61.0 in | Wt 118.6 lb

## 2013-01-29 DIAGNOSIS — Z9884 Bariatric surgery status: Secondary | ICD-10-CM

## 2013-01-29 NOTE — Progress Notes (Signed)
Name:  Katelyn Bailey Date of Birth:  [birth year] MRN:  [id number]   ASSESSMENT AND PLAN: 1. Morbid obesity.  Initial weight-196, BMI-37.12  She had a RY Gastric Bypass on 03/20/11.  She has been very happy with her results and she has maintained good weight loss.  We will get her one year labs (she will get them in [place]), do the one year follow up picture, and I will see her back in one year.  2. Gastroesophageal reflux disease. Resolved. 3. Hypertension for 10 years. Resolved. 4. Hypercholesterolemia. Off meds.  5. Mild anxiety.  6. Hysterectomy - 04/09/2012 - Dr. Tenny Craw.    7.  Mild Fe anemia - on supplemental Fe.  HISTORY OF PRESENT ILLNESS: Chief Complaint  Patient presents with  . Bariatric Follow Up   Katelyn Bailey is a 47 y.o. (DOB: 12-17-1965)  white female who is a patient of NADEL,SCOTT M, MD and comes to me today for follow up of RYGB.  Her preop weight was Wt 196 lb 8 oz (89.132 kg)  and a BMI 37.13.  She has done well. She has been stable over the past year.  She had a hysterectomy last fall, but I forgot to ask her about this.  She is exercising almost daily.  She is having no trouble with her food.  Social History:  Works as Sports coach for [employer].  PHYSICAL EXAM: BP 122/78  Pulse 76  Resp 14  Ht 5\' 1"  (1.549 m)  Wt 118 lb 9.6 oz (53.797 kg)  BMI 22.42 kg/m2  General:  Looks good. HEENT: Normal. Pupils equal. Normal dentition. Neck: Supple. No thyroid mass. Lungs: Clear and symmetric. Heart:  RRR. No murmur. Abdomen:  No tenderness. No hernia. Normal bowel sounds.  Incisions look good. Has excess skin.  But the excess skin is getting better and not bothering her.  DATA REVIEWED: I reviewed the labs that Dr. Kriste Basque has ordered and suggested that she also get a Folate and Thiamine.  Ovidio Kin, MD, [facility] Pager: [phone] Office phone:  [phone]

## 2013-02-03 ENCOUNTER — Telehealth: Payer: Self-pay | Admitting: Pulmonary Disease

## 2013-02-03 DIAGNOSIS — Z9884 Bariatric surgery status: Secondary | ICD-10-CM

## 2013-02-03 NOTE — Telephone Encounter (Signed)
Per SN---ok to order these labs.  i have placed these orders and i called and lmom to make the pt aware.  Nothing further is needed.

## 2013-02-03 NOTE — Telephone Encounter (Signed)
I spoke with pt. She is wanting to know if we will order B1 lab for her. She satetd her surgeon wants this ordered. Please advise SN thanks Last OV 10/14/12 10/14/13 pending

## 2013-02-04 ENCOUNTER — Other Ambulatory Visit (INDEPENDENT_AMBULATORY_CARE_PROVIDER_SITE_OTHER): Payer: BC Managed Care – PPO

## 2013-02-04 DIAGNOSIS — D649 Anemia, unspecified: Secondary | ICD-10-CM

## 2013-02-04 DIAGNOSIS — Z9884 Bariatric surgery status: Secondary | ICD-10-CM

## 2013-02-04 DIAGNOSIS — F411 Generalized anxiety disorder: Secondary | ICD-10-CM

## 2013-02-04 LAB — CBC WITH DIFFERENTIAL/PLATELET
Basophils Absolute: 0 10*3/uL (ref 0.0–0.1)
Basophils Relative: 0.9 % (ref 0.0–3.0)
Eosinophils Absolute: 0.1 10*3/uL (ref 0.0–0.7)
Eosinophils Relative: 1.3 % (ref 0.0–5.0)
HCT: 37.2 % (ref 36.0–46.0)
Hemoglobin: 12.4 g/dL (ref 12.0–15.0)
Lymphocytes Relative: 44 % (ref 12.0–46.0)
Lymphs Abs: 1.9 10*3/uL (ref 0.7–4.0)
MCHC: 33.2 g/dL (ref 30.0–36.0)
MCV: 94.2 fl (ref 78.0–100.0)
Monocytes Absolute: 0.3 10*3/uL (ref 0.1–1.0)
Monocytes Relative: 6.9 % (ref 3.0–12.0)
Neutro Abs: 2 10*3/uL (ref 1.4–7.7)
Neutrophils Relative %: 46.9 % (ref 43.0–77.0)
Platelets: 154 10*3/uL (ref 150.0–400.0)
RBC: 3.95 Mil/uL (ref 3.87–5.11)
RDW: 13.3 % (ref 11.5–14.6)
WBC: 4.3 10*3/uL — ABNORMAL LOW (ref 4.5–10.5)

## 2013-02-04 LAB — HEPATIC FUNCTION PANEL
ALT: 31 U/L (ref 0–35)
AST: 28 U/L (ref 0–37)
Albumin: 3.9 g/dL (ref 3.5–5.2)
Alkaline Phosphatase: 60 U/L (ref 39–117)
Bilirubin, Direct: 0.1 mg/dL (ref 0.0–0.3)
Total Bilirubin: 0.4 mg/dL (ref 0.3–1.2)
Total Protein: 6.7 g/dL (ref 6.0–8.3)

## 2013-02-04 LAB — FOLATE: Folate: >24.8 ng/mL (ref >5.9)

## 2013-02-04 LAB — IBC PANEL
Iron: 57 ug/dL (ref 42–145)
Saturation Ratios: 14 % — ABNORMAL LOW (ref 20.0–50.0)
Transferrin: 290.4 mg/dL (ref 212.0–360.0)

## 2013-02-04 LAB — LIPID PANEL
Cholesterol: 178 mg/dL (ref 0–200)
HDL: 69.9 mg/dL (ref >39.00)
LDL Cholesterol: 98 mg/dL (ref 0–99)
Total CHOL/HDL Ratio: 3
Triglycerides: 50 mg/dL (ref 0.0–149.0)
VLDL: 10 mg/dL (ref 0.0–40.0)

## 2013-02-04 LAB — VITAMIN B12: Vitamin B-12: 477 pg/mL (ref 211–911)

## 2013-02-04 LAB — BASIC METABOLIC PANEL
BUN: 13 mg/dL (ref 6–23)
CO2: 33 mEq/L — ABNORMAL HIGH (ref 19–32)
Calcium: 9 mg/dL (ref 8.4–10.5)
Chloride: 106 mEq/L (ref 96–112)
Creatinine, Ser: 0.7 mg/dL (ref 0.4–1.2)
GFR: 97.79 mL/min (ref >60.00)
Glucose, Bld: 85 mg/dL (ref 70–99)
Potassium: 4.4 mEq/L (ref 3.5–5.1)
Sodium: 140 mEq/L (ref 135–145)

## 2013-02-04 LAB — TSH: TSH: 1.18 u[IU]/mL (ref 0.35–5.50)

## 2013-02-05 LAB — VITAMIN D 25 HYDROXY (VIT D DEFICIENCY, FRACTURES): Vit D, 25-Hydroxy: 43 ng/mL (ref 30–89)

## 2013-02-07 LAB — VITAMIN B1: Vitamin B1 (Thiamine): 13 nmol/L (ref 8–30)

## 2013-02-10 ENCOUNTER — Telehealth: Payer: Self-pay | Admitting: Pulmonary Disease

## 2013-02-10 NOTE — Telephone Encounter (Signed)
Notes Recorded by Michele Mcalpine, MD on 02/05/2013 at 8:52 AM Please notify patient>  FLP looks great on diet alone, all parameters wnl... Chems, LFTs are wnl... CBC is improved w/ Hg=12.4 & Fe=57; Rec- continue Fe one daily... Thyroid, VitD, VitB12> all wnl... Continue supplements... B1 is pending [facility]...  Pt advised of all results. Carron Curie, CMA

## 2013-03-27 ENCOUNTER — Telehealth: Payer: Self-pay | Admitting: Pulmonary Disease

## 2013-03-27 NOTE — Telephone Encounter (Signed)
lmomtcb  

## 2013-03-27 NOTE — Telephone Encounter (Signed)
Pt returned call. Katelyn Bailey °

## 2013-03-27 NOTE — Telephone Encounter (Signed)
I spoke with pt she stated is having trouble sleeping. She stated she goes to sleep fine and then wake up about 4 hrs later and then can't go back to sleep. She stated she has not tried anything OTC. Pt has alprazolam 0.5 mg on med list TID PRN. She is requesting recs or something called in. Please advise SN thanks Last OV 10/14/12 Pending 10/14/13 No Known Allergies    I spoke with SN. He stated pt can try taking her alprazolam closer to bedtime, can try benadrylk, melatonin OTC, zyquil.   I called and made pt aware and needed nothing further

## 2013-09-30 ENCOUNTER — Telehealth: Payer: Self-pay | Admitting: Pulmonary Disease

## 2013-09-30 NOTE — Telephone Encounter (Signed)
Spoke with patient-states she will call someone in [place] to see as far as ortho. Pt does not think she needs referral based on her insurance. If she does then pt will call us back with the name of the MD to send referral to [facility] for her.

## 2013-09-30 NOTE — Telephone Encounter (Signed)
Last OV 10-14-12. Spoke with the pt and she states that on Sunday she lost her balance and did not fall but caught herself on the counter. She states ever since then her left shoulder has been hurting and very sore. No bruising or visible injury.  She states the pain and soreness is also in her left arm as well. Pt is requesting an appt with SN today, nothing available. Please advise. [place] Bing, CMA No Known Allergies

## 2013-09-30 NOTE — Telephone Encounter (Signed)
lmtcb x1 

## 2013-09-30 NOTE — Telephone Encounter (Signed)
Per SN - Need ortho referral - Refer to hand center or her choice

## 2013-10-14 ENCOUNTER — Ambulatory Visit (INDEPENDENT_AMBULATORY_CARE_PROVIDER_SITE_OTHER)
Admission: RE | Admit: 2013-10-14 | Discharge: 2013-10-14 | Disposition: A | Discharge status: Home / Self Care | Payer: BC Managed Care – PPO | Source: Ambulatory Visit | Attending: Pulmonary Disease | Admitting: Pulmonary Disease

## 2013-10-14 ENCOUNTER — Encounter (INDEPENDENT_AMBULATORY_CARE_PROVIDER_SITE_OTHER): Payer: Self-pay

## 2013-10-14 ENCOUNTER — Other Ambulatory Visit (INDEPENDENT_AMBULATORY_CARE_PROVIDER_SITE_OTHER): Payer: BC Managed Care – PPO

## 2013-10-14 ENCOUNTER — Encounter: Payer: Self-pay | Admitting: Pulmonary Disease

## 2013-10-14 ENCOUNTER — Ambulatory Visit (INDEPENDENT_AMBULATORY_CARE_PROVIDER_SITE_OTHER): Payer: BC Managed Care – PPO | Admitting: Pulmonary Disease

## 2013-10-14 VITALS — BP 120/84 | HR 65 | Temp 97.3°F | Ht 61.0 in | Wt 128.4 lb

## 2013-10-14 DIAGNOSIS — F411 Generalized anxiety disorder: Secondary | ICD-10-CM

## 2013-10-14 DIAGNOSIS — Z0000 Encounter for general adult medical examination without abnormal findings: Secondary | ICD-10-CM

## 2013-10-14 DIAGNOSIS — G8929 Other chronic pain: Secondary | ICD-10-CM

## 2013-10-14 DIAGNOSIS — K449 Diaphragmatic hernia without obstruction or gangrene: Secondary | ICD-10-CM

## 2013-10-14 DIAGNOSIS — R519 Headache, unspecified: Secondary | ICD-10-CM

## 2013-10-14 DIAGNOSIS — R51 Headache: Secondary | ICD-10-CM

## 2013-10-14 DIAGNOSIS — Z9884 Bariatric surgery status: Secondary | ICD-10-CM

## 2013-10-14 LAB — LIPID PANEL
Cholesterol: 196 mg/dL (ref 0–200)
HDL: 70.9 mg/dL (ref >39.00)
LDL Cholesterol: 112 mg/dL — ABNORMAL HIGH (ref 0–99)
Total CHOL/HDL Ratio: 3
Triglycerides: 68 mg/dL (ref 0.0–149.0)
VLDL: 13.6 mg/dL (ref 0.0–40.0)

## 2013-10-14 LAB — BASIC METABOLIC PANEL
BUN: 12 mg/dL (ref 6–23)
CO2: 29 mEq/L (ref 19–32)
Calcium: 9.1 mg/dL (ref 8.4–10.5)
Chloride: 105 mEq/L (ref 96–112)
Creatinine, Ser: 0.5 mg/dL (ref 0.4–1.2)
GFR: 129.36 mL/min (ref >60.00)
Glucose, Bld: 81 mg/dL (ref 70–99)
Potassium: 4.4 mEq/L (ref 3.5–5.1)
Sodium: 138 mEq/L (ref 135–145)

## 2013-10-14 LAB — CBC WITH DIFFERENTIAL/PLATELET
Basophils Absolute: 0.1 10*3/uL (ref 0.0–0.1)
Basophils Relative: 1.3 % (ref 0.0–3.0)
Eosinophils Absolute: 0.1 10*3/uL (ref 0.0–0.7)
Eosinophils Relative: 1 % (ref 0.0–5.0)
HCT: 37.4 % (ref 36.0–46.0)
Hemoglobin: 12.1 g/dL (ref 12.0–15.0)
Lymphocytes Relative: 28.2 % (ref 12.0–46.0)
Lymphs Abs: 1.8 10*3/uL (ref 0.7–4.0)
MCHC: 32.5 g/dL (ref 30.0–36.0)
MCV: 91.3 fl (ref 78.0–100.0)
Monocytes Absolute: 0.5 10*3/uL (ref 0.1–1.0)
Monocytes Relative: 7.3 % (ref 3.0–12.0)
Neutro Abs: 3.9 10*3/uL (ref 1.4–7.7)
Neutrophils Relative %: 62.2 % (ref 43.0–77.0)
Platelets: 184 10*3/uL (ref 150.0–400.0)
RBC: 4.09 Mil/uL (ref 3.87–5.11)
RDW: 14.5 % (ref 11.5–14.6)
WBC: 6.3 10*3/uL (ref 4.5–10.5)

## 2013-10-14 LAB — HEPATIC FUNCTION PANEL
ALT: 34 U/L (ref 0–35)
AST: 27 U/L (ref 0–37)
Albumin: 4 g/dL (ref 3.5–5.2)
Alkaline Phosphatase: 57 U/L (ref 39–117)
Bilirubin, Direct: 0 mg/dL (ref 0.0–0.3)
Total Bilirubin: 0.5 mg/dL (ref 0.3–1.2)
Total Protein: 6.7 g/dL (ref 6.0–8.3)

## 2013-10-14 LAB — TSH: TSH: 0.73 u[IU]/mL (ref 0.35–5.50)

## 2013-10-14 LAB — IBC PANEL
Iron: 86 ug/dL (ref 42–145)
Saturation Ratios: 18.3 % — ABNORMAL LOW (ref 20.0–50.0)
Transferrin: 335.4 mg/dL (ref 212.0–360.0)

## 2013-10-14 LAB — VITAMIN B12: Vitamin B-12: 408 pg/mL (ref 211–911)

## 2013-10-14 MED ORDER — ALPRAZOLAM 0.5 MG PO TABS: ORAL_TABLET | ORAL | Status: DC

## 2013-10-14 MED ORDER — GABAPENTIN 800 MG PO TABS
800.0000 mg | ORAL_TABLET | Freq: Every day | ORAL | Status: DC
Start: 2013-10-14 — End: 2014-04-30

## 2013-10-14 NOTE — Progress Notes (Signed)
Subjective:    Patient ID: Katelyn Bailey, female    DOB: 1965-11-15, 48 y.o.   MRN: [id number]  HPI 48 y/o WF here for a follow up visit... she has mult med problems including:  HBP, Ven Insuffic;  Hyperchol;  Obesity;  HH/ GERD; and Anxiety...   ~  July 07, 2011:  76mo ROV & Katelyn Bailey is feeling well- no new complaints or concerns;  She has lost a further 27# to 156# today & she is very pleased;  BP has been running sl low on her LisinoprilHCT 1/2 tab daily & we discussed stopping this med now & following BP at home;  Already off statin Rx & FLP looks great now on diet alone;  Chems normal, CBC w/ mild anemia Hg=11.3/ Fe=28 (7%sat) and advised to start Fe 325mg  + VitC 500mg  daily (we will recheck lab & if no better she may need the Fe IV);  B12, Folate/ Mag are all ok...  ~  September 11, 2011:  53mo ROV & add-on for occipital area pain> c/o left neck/ occipital/ post head pain that comes & goes w/o known ppt cause; present for several weeks, occurs 2+ times daily, pain is severe rated 7-8/10 & lasts 10-44min 7 resolves spont (no meds needed); denies visual symptoms, N/V, etc; she has never had similar pain;  Exam shows sl tender over left occiput, full ROM neck, no neuro findings, etc...  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  NOTE: she had f/u FASTING blood work today> FLP looks great, BS normal> all on diet alone; just dec Ferritin= 5.1 & rec to continue FeSO4 daily for now...  ~  January 05, 2012:  20mo ROV & Katelyn Bailey looks great, now 127# down from 224# [id number] & most of her medical problems have resolved w/ the 100# wt loss after her Bariatric surg...  SEE UPDATED PROB LIST BELOW>>    Her CC continues to be her occipital headaches, c/w occip  neuralgia, & she's been eval by [facility]; she's had shots which help for awhile & is currently on NEURONTIN 600mg /d- improved but HAs persist 7 she has f/u appt soon...  ~  October 14, 2012:  58mo ROV & Katelyn Bailey continues to do well; she has lost down to 118# & is trying to put a few back on w/ protein shakes, etc... We reviewed the following medical problems during today's office visit >>    Hx HBP> off meds since gastric surg- BP= 122/84 on diet alone now & she denies CP, palpit, dizzy, SOB, edema, etc...    Ven Insuffic> she knows to elim salt, elev legs, wear support hose prn etc...    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    GI- HH, GERD> she uses OTC Prilosec20 prn but doing well w/o abd pain, dysphagia, n/v, c/d, blood seen....    UTI> TP  treated a UTI (sens EColi) 12/13 w/ Cipro & resolved...    GYN> she had a TAH by [facility] 9/13 for menorrhagia & fibroids...    HAs> on Neurontin 800mg /d; she reports that this works well for prevention at this dose...    Anxiety> on Alpraz0.5mg  prn; in 03-Nov-2006 her 18y/o son passed away in his sleep w/o explanation...    Hx Anemia> on Fe tab daily; post hyst Hg~11 range; f/u labs pending... We reviewed prob list, meds, xrays and labs> see below for updates >>   She will return for FASTING blood work => pending...   ~  October 14, 2013:  Yearly ROV & CPX> Katelyn Bailey reports a good yr- notes some left shoulder & arm pain w/ eval by Ortho (in [place])- given Dosepak & may need MRI if not improved;  Weight is up 11# to 128# now;  We reviewed the following medical problems during today's office visit >>     Hx HBP> off meds since gastric surg- BP= 120/84 on diet alone now & she denies CP,  palpit, dizzy, SOB, edema, etc...    Ven Insuffic> she knows to elim salt, elev legs, wear support hose prn etc...    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    GI- HH, GERD> she uses OTC Prilosec20 prn but doing well w/o abd pain, dysphagia, n/v, c/d, blood seen....    UTI> TP treated a UTI (sens EColi) 12/13 w/ Cipro & resolved...    GYN> she had a TAH by [facility] 9/13 for menorrhagia & fibroids...    HAs> on Neurontin 800mg /d; she reports that this works well for prevention at this dose...    Anxiety> on Alpraz0.5mg  prn; in Nov 03, 2006 her 18y/o son passed away in his sleep w/o explanation...    Hx Anemia> on Fe tab daily; post hyst Hg~11 range; f/u labs pending... We reviewed prob list, meds, xrays and labs> see below for updates >>   CXR 3/15 showed norm heart size, clear lungs, min scoliosis, NAD.Marland KitchenMarland Kitchen  EKG 3/15 showed NSR, rate63, min NSSTTWA...  LABS 3/15:  FLP- ok on diet alone x LDL=112;  Chems- wnl;  CBC- ok w/ Hg=12.1;  Fe=86 (18%sat) on Fe/d;  TSH=0.73;  B12=408 on oral B12 supplement...          Problem List:  Hx of ESSENTIAL HYPERTENSION >> RESOLVED w/ weight loss after bariatric surg...  prev on Lisinopril/Hct 20-12.5 & weaned off Rx w/ normal BP off meds now... ~  CXR 1/12 showed normal heart size, clear lungs... ~  EKG 1/12 showed NSR,  NSSTTWA... ~  9/12:  BP= 124/84 today on 1/2 of the LisinoprilHCT & similar at home, & we discussed continuing same for now... denies HA, visual changes, CP, palipit, dizziness, syncope, dyspnea, edema, etc... ~  12/12:  BP= 118/62 & even lower at home; she is anxious to stop the med & seen how she does monitoring BP at home.. ~  2/13:  BP= 122/84 off all  meds, great job! ~  6/13:  BP= 110/68 ~  3/14: off meds since gastric surg- BP= 122/84 on diet alone now & she denies CP,  palpit, dizzy, SOB, edema, etc... ~  3/15: off meds since gastric surg- BP= 120/84 on diet alone & she remains basically asymptomatic...  VENOUS INSUFFICIENCY, LEGS (ICD-459.81) - follows a low sodium diet, & we discussed elevation & support hose to minimize any swelling...  Hx of HYPERCHOLESTEROLEMIA >> prev on Simvastatin 40mg /d & now off all meds after bariatric surg... 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 ~  FLP 12/12 on diet alone showed TChol 152, TG 52, HDL 40, LDL 102 ~  FLP 2/13 on diet alone showed TChol 152, TG 47, HDL 40, LDL 103... Ok on diet alone, off meds. ~  [place] 7/13 on diet alone showed TChol 164, TG 78, HDL 52, LDL 96  ~  FLP 7/14 on diet alone showed TChol 178, TG 50, HDL 70, LDL 98 ~  FLP 3/15 on diet alone showed TChol 196, TG 68, HDL 71, LDL 112  Hx of OVERWEIGHT (ICD-278.02) >>  ~  Past Hx from old chart> 200# in 2001; 207# in '02; 210# in '05; 219# in '07; & 211# 8/08 ~  weight 5/09 = down to 175# with all the stress (17 y/o son died suddenly 04/21/2023 ?cause) ~  weight 1/11 = 205#... on weight watchers now. ~  weight 9/11 = 222#... she is 3" tall, & BMI= 42. ~  weight 1/12 = 224#.Marland KitchenMarland Kitchen we reviewed low carb, low fat, low salt diet. ~  weight 2/12 = 219#... Insurance co required 53mo medically supervised wt  loss program. 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 BMI= 29 ~  Weight 2/13 = 143#... 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 ACID REFLUX DISEASE (ICD-530.81) - 8/08 eval w/ LER, improved w/ PROTONIX 40/d... ~  UGI series post bypass surg> normal w/o anastomotic leak etc... ~  9/12:  OK to try to wean the Protonix off, titrate to any symptoms...  S/P TAH 9/13 by DrARoss >> for Menorrhagia & Fibroids... ~  Post op Hg stable in the 11 range...  HEADACHES >> Hx c/w occipital Neuralgia & we tried Topamax 2/13 w/o relief;  [facility] & she received series of shots in occipital area w/ temp relief; now on NEURONTIN 800mg /d & this dose controls her HAs... DIZZINESS (ICD-780.4) - uses meclizine as needed.  ANXIETY (ICD-300.00) - on ALPRAZOLAM 0.5mg  Qhs Prn... ~  04-21-2023:  tragically her 57 y/o son died in his sleep 29-Apr-2023 and autopsy showed no apparent cause, he was healthy without medical problems... she lost 35 lbs w/ the stress... she is getting counselling thru hospice... she has a 33 y/o son at home.  ANEMIA >> started on Fe 325mg /d + VitC 500mg /d + Vit B12 522mcg/d... ~  Labs 8/12 showed Hg= 11.0, /mcv= 88 ~  Labs 12/12 showed Hg= 11.3, MCV= 86, Fe= 28 (7%sat).. rec to start Fe+VitC... ~  Labs 2/13 on Fe daily showed Hg= 12.4, MCV= 90, Fe= 63 (17%), Ferritin= 5.1 (10-291), B12= 752 ~  Labs 3/15 showed Hg= 12.1, MCV= 91; Fe=86 (18%sat); B12= 408  Health Maintenance: ~  GI:  S/p gastric bypass surg 8/12 ; given stool cards to check for hidden blood; otherwise colonoscopy not due til after age 62... ~  GYN:  DrSilva- with PAP & Mammogram each spring => s/p Hysterectomy 9/13 by  DrRoss... ~  Immunizations:  she gets the yearly Flu vaccines each autumn;  Given TDAP here 6/13;  she will not require a Pneumonia vaccine until age 4, unless the current recommendations change...   Past Surgical History  Procedure Laterality Date  . Tubal ligation  1997  . Cesarean section  1990  . Roux-en-y gastric bypass  03/20/2011  . Abdominal hysterectomy  04/09/2012    Procedure: HYSTERECTOMY ABDOMINAL;  Surgeon: Gus Height, MD;  Location: [facility];  Service: Gynecology;  Laterality: N/A;  . Skin carcinoma  06/21/2012    removed from upper lip    Outpatient Encounter Prescriptions as of 10/14/2013  Medication Sig  . ALPRAZolam (XANAX) 0.5 MG tablet Take 1/2 to 1 tablet by mouth three times daily as needed  . CALCIUM CITRATE-VITAMIN D PO Take 1 tablet by mouth 3 (three) times daily.  . Cyanocobalamin (VITAMIN B-12 PO) Take 1 tablet by mouth daily.  Marland Kitchen gabapentin (NEURONTIN) 800 MG tablet Take 1 tablet (800 mg total) by mouth daily.  . IRON PO Take 325 mg by mouth daily.   . Multiple Vitamin (MULTIVITAMIN WITH MINERALS) TABS Take 1 tablet by mouth 2 (two) times daily.  . [DISCONTINUED] EST ESTROGENS-METHYLTEST HS 0.625-1.25 MG per tablet     No Known Allergies   Current Medications, Allergies, Past Medical History, Past Surgical History, Family History, and Social History were reviewed in [employer] record.    Review of Systems       See HPI - all other systems neg except as noted...  The patient notes steady wt reduction s/p gastic bypass surg 8/12;  She denies anorexia, fever, vision loss, decreased hearing, hoarseness, chest pain, syncope, dyspnea on exertion, peripheral edema, prolonged cough, headaches, hemoptysis, abdominal pain, melena, hematochezia, severe indigestion/heartburn, hematuria, incontinence, muscle weakness, suspicious skin lesions, transient blindness, difficulty walking, depression, abnormal bleeding, enlarged lymph nodes, and  angioedema.     Objective:   Physical Exam    WD, overweight, 48 y/o WF in NAD.Marland KitchenMarland Kitchen 207#, 61" tall, BMI=39 GENERAL:  Alert & oriented; pleasant & cooperative. HEENT:  [place]/AT, EOM-full, PERRLA, EACs-clear, TMs-wnl, NOSE-clear, THROAT-clear & wnl. NECK:  Supple w/ full ROM; no JVD; normal carotid impulses w/o bruits; no thyromegaly or nodules palpated; no lymphadenopathy. CHEST:  Clear to P & A; without wheezes/ rales/ or rhonchi. HEART:  Regular Rhythm; without murmurs/ rubs/ or gallops. ABDOMEN:  Soft & nontender, 5 sm incisions from Lap surg; normal bowel sounds; no organomegaly or masses detected. EXT: without deformities or arthritic changes; no varicose veins/ venous  insuffic/ or edema.  DERM:  no lesions seen, no rashes, etc...  RADIOLOGY DATA:  Reviewed in the EPIC EMR & discussed w/ the patient...  LABORATORY DATA:  Reviewed in the EPIC EMR & discussed w/ the patient...   Assessment & Plan:    Occipital Neuralgia>  Now followed by [facility] w/ occipital shots that helped for awhile, and on NEURONTIN being titrated=> improved...  HBP>  BP today is stable off all meds on diet alone...  CHOL>  FLP looks great off the Simva on diet alone...  OBESITY>  She is now 81mo s/p Bariatric Surg w/ Laparoscopic Roux-en-Y gastric bypass by [facility]; she has lost 80+ from her peak wt...  GERD>  She is off the Protonix as well & denies abd pain, reflux, etc...  Anxiety>  She is coping well w/ the stressors in her life...  ANEMIA>  Hx mild anemia & eval revealed low Fe; supplemented orally & improved but Ferritin still low so we will continue the FeSO4 daily (f/u labs pending)...   Patient's Medications  New Prescriptions   No medications on file  Previous Medications   CALCIUM CITRATE-VITAMIN D PO    Take 1 tablet by mouth 3 (three) times daily.   CYANOCOBALAMIN (VITAMIN B-12 PO)    Take 1 tablet by mouth daily.   IRON PO    Take 325 mg by mouth daily.    MULTIPLE VITAMIN  (MULTIVITAMIN WITH MINERALS) TABS    Take 1 tablet by mouth 2 (two) times daily.  Modified Medications   Modified Medication Previous Medication   ALPRAZOLAM (XANAX) 0.5 MG TABLET ALPRAZolam (XANAX) 0.5 MG tablet      Take 1/2 to 1 tablet by mouth three times daily as needed    Take 1/2 to 1 tablet by mouth three times daily as needed   GABAPENTIN (NEURONTIN) 800 MG TABLET gabapentin (NEURONTIN) 800 MG tablet      Take 1 tablet (800 mg total) by mouth daily.    Take 1 tablet (800 mg total) by mouth daily.  Discontinued Medications   EST ESTROGENS-METHYLTEST HS 0.625-1.25 MG PER TABLET

## 2013-10-14 NOTE — Patient Instructions (Signed)
Today we updated your med list in our EPIC system...    Continue your current medications the same...    We refilled your meds per request...  For the shoulder/ arm discomfort>>    Your Orthopedist took XRays & indicated that they were neg--    Try heat to the shoulder area & Aleve as needed...    If the discomfort is persistent or worse, the next step is an MRI of your CSpine...    let us know how you are doing...  Today we did your follow up CXR, EKG, FASTING blood work...    We will contact you w/ the results when available...   Call for any problems.Marland KitchenMarland Kitchen

## 2013-10-23 ENCOUNTER — Telehealth: Payer: Self-pay | Admitting: Pulmonary Disease

## 2013-10-23 NOTE — Telephone Encounter (Signed)
Per OV w/ SN 10/12/13: Patient Instructions      Today we updated your med list in our EPIC system...    Continue your current medications the same...    We refilled your meds per request... For the shoulder/ arm discomfort>>    Your Orthopedist took XRays & indicated that they were neg--    Try heat to the shoulder area & Aleve as needed...    If the discomfort is persistent or worse, the next step is an MRI of your CSpine...    let us know how you are doing... Today we did your follow up CXR, EKG, FASTING blood work...    We will contact you w/ the results when available...  Call for any problems...  ---   lmomtcb x1

## 2013-10-24 NOTE — Telephone Encounter (Signed)
Pt has returned call.  Please return call to her home # [phone].

## 2013-10-24 NOTE — Telephone Encounter (Signed)
Called and spoke with pt. She decided she no longers wants an MRI. Will sign off message

## 2013-10-24 NOTE — Telephone Encounter (Signed)
lmomtcb for pt 

## 2013-12-25 ENCOUNTER — Other Ambulatory Visit: Payer: Self-pay | Admitting: Obstetrics and Gynecology

## 2014-04-14 ENCOUNTER — Ambulatory Visit (INDEPENDENT_AMBULATORY_CARE_PROVIDER_SITE_OTHER): Payer: BC Managed Care – PPO | Admitting: Family Medicine

## 2014-04-14 ENCOUNTER — Encounter: Payer: Self-pay | Admitting: Family Medicine

## 2014-04-14 ENCOUNTER — Ambulatory Visit (HOSPITAL_BASED_OUTPATIENT_CLINIC_OR_DEPARTMENT_OTHER)
Admission: RE | Admit: 2014-04-14 | Discharge: 2014-04-14 | Disposition: A | Discharge status: Home / Self Care | Payer: BC Managed Care – PPO | Source: Ambulatory Visit | Attending: Family Medicine | Admitting: Family Medicine

## 2014-04-14 VITALS — BP 152/88 | HR 64 | Temp 97.8°F | Wt 135.4 lb

## 2014-04-14 DIAGNOSIS — M5030 Other cervical disc degeneration, unspecified cervical region: Secondary | ICD-10-CM | POA: Diagnosis not present

## 2014-04-14 DIAGNOSIS — I10 Essential (primary) hypertension: Secondary | ICD-10-CM

## 2014-04-14 DIAGNOSIS — M5412 Radiculopathy, cervical region: Secondary | ICD-10-CM

## 2014-04-14 DIAGNOSIS — M542 Cervicalgia: Secondary | ICD-10-CM | POA: Diagnosis present

## 2014-04-14 DIAGNOSIS — R300 Dysuria: Secondary | ICD-10-CM

## 2014-04-14 LAB — POCT URINALYSIS DIPSTICK
Bilirubin, UA: NEGATIVE
Blood, UA: NEGATIVE
Glucose, UA: NEGATIVE
Ketones, UA: NEGATIVE
Nitrite, UA: NEGATIVE
Protein, UA: NEGATIVE
Spec Grav, UA: 1.01
Urobilinogen, UA: 2
pH, UA: 6

## 2014-04-14 MED ORDER — LISINOPRIL-HYDROCHLOROTHIAZIDE 10-12.5 MG PO TABS: 1.0000 | ORAL_TABLET | Freq: Every day | ORAL | Status: DC

## 2014-04-14 NOTE — Progress Notes (Signed)
Pre visit review using our clinic review tool, if applicable. No additional management support is needed unless otherwise documented below in the visit note. 

## 2014-04-14 NOTE — Patient Instructions (Signed)

## 2014-04-14 NOTE — Progress Notes (Signed)
  Subjective:    Patient here for follow-up of elevated blood pressure.  She is exercising and is adherent to a low-salt diet.  Blood pressure is not well controlled at home. Cardiac symptoms: none. Patient denies: chest pain, chest pressure/discomfort, claudication, dyspnea, exertional chest pressure/discomfort, fatigue, irregular heart beat, lower extremity edema, near-syncope, orthopnea, palpitations, paroxysmal nocturnal dyspnea, syncope and tachypnea. Cardiovascular risk factors: hypertension. Use of agents associated with hypertension: none. History of target organ damage: none.  The following portions of the patient's history were reviewed and updated as appropriate:  She  has a past medical history of Hypercholesterolemia; Overweight(278.02); Dizziness; Occipital neuralgia; Headache(784.0); Neuralgia; Hiatal hernia; Acid reflux disease; Essential hypertension; Kidney stone; and Complication of anesthesia. She  does not have any pertinent problems on file. 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 Her family history includes Cancer in her mother; Heart disease in her father and paternal grandfather. She  reports that she has never smoked. She has never used smokeless tobacco. She reports that she does not drink alcohol or use illicit drugs. She has a current medication list which includes the following prescription(s): alprazolam, calcium citrate-vitamin d, cyanocobalamin, gabapentin, iron, multivitamin with minerals, and lisinopril-hydrochlorothiazide. Current Outpatient Prescriptions on File Prior to Visit  Medication Sig Dispense Refill  . ALPRAZolam (XANAX) 0.5 MG tablet Take 1/2 to 1 tablet by mouth three times daily as needed  90 tablet  5  . CALCIUM CITRATE-VITAMIN D PO Take 1 tablet by mouth 3 (three) times daily.      .  Cyanocobalamin (VITAMIN B-12 PO) Take 1 tablet by mouth daily.      Marland Kitchen gabapentin (NEURONTIN) 800 MG tablet Take 1 tablet (800 mg total) by mouth daily.  30 tablet  5  . IRON PO Take 325 mg by mouth daily.       . Multiple Vitamin (MULTIVITAMIN WITH MINERALS) TABS Take 1 tablet by mouth 2 (two) times daily.      . [DISCONTINUED] topiramate (TOPAMAX) 25 MG capsule Take one tablet by mouth at bedtime x 1 week then 1 po bid thereafter.  60 capsule  5   No current facility-administered medications on file prior to visit.   She has No Known Allergies..  Review of Systems Pertinent items are noted in HPI.     Objective:    BP 152/88  Pulse 64  Temp(Src) 97.8 F (36.6 C) (Oral)  Wt 135 lb 5.8 oz (61.4 kg)  SpO2 100%  LMP 11/08/2010 General appearance: alert, cooperative, appears stated age and no distress Abdomen: soft, non-tender; bowel sounds normal; no masses,  no organomegaly Extremities: extremities normal, atraumatic, no cyanosis or edema Skin: small papules on both legs-- non pruritic   neuro-- numbness in 2-4 fingers on L eft hand, no pain in neck today Assessment:    Hypertension, elevated  . Evidence of target organ damage: none.    Plan:    Medication: begin lisinopril 10/12.5. Dietary sodium restriction. Regular aerobic exercise. Follow up: 2 weeks and as needed.   1. Dysuria Check culture first - POCT Urinalysis Dipstick - Urine Culture  2. Essential hypertension Restart meds - lisinopril-hydrochlorothiazide (PRINZIDE,ZESTORETIC) 10-12.5 MG per tablet; Take 1 tablet by mouth daily.  Dispense: 30 tablet; Refill: 2  3. Cervical radicular pain Check xray - DG Cervical Spine Complete; Future

## 2014-04-15 ENCOUNTER — Other Ambulatory Visit: Payer: Self-pay

## 2014-04-15 ENCOUNTER — Ambulatory Visit (HOSPITAL_BASED_OUTPATIENT_CLINIC_OR_DEPARTMENT_OTHER)
Admission: RE | Admit: 2014-04-15 | Discharge: 2014-04-15 | Disposition: A | Discharge status: Home / Self Care | Payer: BC Managed Care – PPO | Source: Ambulatory Visit | Attending: Family Medicine | Admitting: Family Medicine

## 2014-04-15 ENCOUNTER — Other Ambulatory Visit: Payer: Self-pay | Admitting: Family Medicine

## 2014-04-15 DIAGNOSIS — M542 Cervicalgia: Secondary | ICD-10-CM

## 2014-04-15 DIAGNOSIS — M4802 Spinal stenosis, cervical region: Secondary | ICD-10-CM

## 2014-04-15 DIAGNOSIS — M9979 Connective tissue and disc stenosis of intervertebral foramina of abdomen and other regions: Secondary | ICD-10-CM

## 2014-04-15 DIAGNOSIS — M5030 Other cervical disc degeneration, unspecified cervical region: Secondary | ICD-10-CM | POA: Diagnosis not present

## 2014-04-15 DIAGNOSIS — M47812 Spondylosis without myelopathy or radiculopathy, cervical region: Secondary | ICD-10-CM | POA: Diagnosis not present

## 2014-04-17 ENCOUNTER — Encounter: Payer: Self-pay | Admitting: Family Medicine

## 2014-04-30 ENCOUNTER — Encounter: Payer: Self-pay | Admitting: Family Medicine

## 2014-04-30 ENCOUNTER — Ambulatory Visit (INDEPENDENT_AMBULATORY_CARE_PROVIDER_SITE_OTHER): Payer: BC Managed Care – PPO | Admitting: Family Medicine

## 2014-04-30 VITALS — BP 117/73 | HR 61 | Temp 98.4°F | Wt 132.7 lb

## 2014-04-30 DIAGNOSIS — Z23 Encounter for immunization: Secondary | ICD-10-CM

## 2014-04-30 DIAGNOSIS — I10 Essential (primary) hypertension: Secondary | ICD-10-CM

## 2014-04-30 LAB — BASIC METABOLIC PANEL
BUN: 13 mg/dL (ref 6–23)
CO2: 31 mEq/L (ref 19–32)
Calcium: 9.2 mg/dL (ref 8.4–10.5)
Chloride: 103 mEq/L (ref 96–112)
Creatinine, Ser: 0.6 mg/dL (ref 0.4–1.2)
GFR: 108.02 mL/min (ref >60.00)
Glucose, Bld: 86 mg/dL (ref 70–99)
Potassium: 4 mEq/L (ref 3.5–5.1)
Sodium: 137 mEq/L (ref 135–145)

## 2014-04-30 MED ORDER — LISINOPRIL-HYDROCHLOROTHIAZIDE 10-12.5 MG PO TABS: 1.0000 | ORAL_TABLET | Freq: Every day | ORAL | Status: DC

## 2014-04-30 NOTE — Progress Notes (Signed)
Pre visit review using our clinic review tool, if applicable. No additional management support is needed unless otherwise documented below in the visit note. 

## 2014-04-30 NOTE — Progress Notes (Signed)
  Subjective:    Patient here for follow-up of elevated blood pressure.  She is exercising and is adherent to a low-salt diet.  Blood pressure is well controlled at home. Cardiac symptoms: none. Patient denies: chest pain, chest pressure/discomfort, claudication, dyspnea, exertional chest pressure/discomfort, fatigue, irregular heart beat, lower extremity edema, near-syncope, orthopnea, palpitations, paroxysmal nocturnal dyspnea, syncope and tachypnea. Cardiovascular risk factors: hypertension. Use of agents associated with hypertension: none. History of target organ damage: none.  Headaches have resolved.   The following portions of the patient's history were reviewed and updated as appropriate: allergies, current medications, past family history, past medical history, past social history, past surgical history and problem list.  Review of Systems Pertinent items are noted in HPI.     Objective:    BP 117/73  Pulse 61  Temp(Src) 98.4 F (36.9 C) (Oral)  Wt 132 lb 11.5 oz (60.2 kg)  SpO2 99%  LMP 11/08/2010 General appearance: alert, cooperative, appears stated age and no distress Neck: no adenopathy, supple, symmetrical, trachea midline and thyroid not enlarged, symmetric, no tenderness/mass/nodules Lungs: clear to auscultation bilaterally Heart: S1, S2 normal Extremities: extremities normal, atraumatic, no cyanosis or edema    Assessment:    Hypertension, normal blood pressure . Evidence of target organ damage: none.    Plan:    Medication: no change. Dietary sodium restriction. Regular aerobic exercise. Follow up: 3 months and as needed.

## 2014-04-30 NOTE — Patient Instructions (Signed)

## 2014-06-11 ENCOUNTER — Ambulatory Visit: Payer: BC Managed Care – PPO | Admitting: Family Medicine

## 2014-08-03 ENCOUNTER — Ambulatory Visit: Payer: BC Managed Care – PPO | Admitting: Family Medicine

## 2014-08-06 ENCOUNTER — Encounter: Payer: Self-pay | Admitting: Family Medicine

## 2014-08-06 ENCOUNTER — Other Ambulatory Visit: Payer: Self-pay | Admitting: Family Medicine

## 2014-08-06 ENCOUNTER — Ambulatory Visit (INDEPENDENT_AMBULATORY_CARE_PROVIDER_SITE_OTHER): Payer: BLUE CROSS/BLUE SHIELD | Admitting: Family Medicine

## 2014-08-06 VITALS — BP 104/76 | HR 61 | Temp 98.1°F | Resp 16 | Wt 134.0 lb

## 2014-08-06 DIAGNOSIS — Z1231 Encounter for screening mammogram for malignant neoplasm of breast: Secondary | ICD-10-CM

## 2014-08-06 DIAGNOSIS — I10 Essential (primary) hypertension: Secondary | ICD-10-CM

## 2014-08-06 MED ORDER — LISINOPRIL-HYDROCHLOROTHIAZIDE 10-12.5 MG PO TABS: 1.0000 | ORAL_TABLET | Freq: Every day | ORAL | Status: DC

## 2014-08-06 NOTE — Progress Notes (Signed)
  Subjective:    Patient here for follow-up of elevated blood pressure.  She is exercising and is adherent to a low-salt diet.  Blood pressure is well controlled at home. Cardiac symptoms: none. Patient denies: chest pain, chest pressure/discomfort, claudication, dyspnea, exertional chest pressure/discomfort, fatigue, irregular heart beat, lower extremity edema, near-syncope, orthopnea, palpitations, paroxysmal nocturnal dyspnea, syncope and tachypnea. Cardiovascular risk factors: hypertension. Use of agents associated with hypertension: none. History of target organ damage: none.  The following portions of the patient's history were reviewed and updated as appropriate: allergies, current medications, past family history, past medical history, past social history, past surgical history and problem list.  Review of Systems Pertinent items are noted in HPI.     Objective:    BP 104/76 mmHg  Pulse 61  Temp(Src) 98.1 F (36.7 C) (Oral)  Resp 16  Wt 134 lb (60.782 kg)  SpO2 96%  LMP 11/08/2010 General appearance: alert, cooperative, appears stated age and no distress Throat: lips, mucosa, and tongue normal; teeth and gums normal Neck: no adenopathy, no carotid bruit, no JVD, supple, symmetrical, trachea midline and thyroid not enlarged, symmetric, no tenderness/mass/nodules Lungs: clear to auscultation bilaterally Heart: S1, S2 normal Extremities: extremities normal, atraumatic, no cyanosis or edema    Assessment:    Hypertension, normal blood pressure . Evidence of target organ damage: none.    Plan:    Medication: no change. Dietary sodium restriction. Regular aerobic exercise. Follow up: 3 months and as needed. ---for cpe

## 2014-08-06 NOTE — Progress Notes (Signed)
Pre visit review using our clinic review tool, if applicable. No additional management support is needed unless otherwise documented below in the visit note. 

## 2014-08-06 NOTE — Patient Instructions (Signed)

## 2014-10-21 ENCOUNTER — Telehealth: Payer: Self-pay | Admitting: Pulmonary Disease

## 2014-10-21 NOTE — Telephone Encounter (Signed)
pre visit letter sent °

## 2014-11-06 ENCOUNTER — Ambulatory Visit (INDEPENDENT_AMBULATORY_CARE_PROVIDER_SITE_OTHER): Payer: BLUE CROSS/BLUE SHIELD | Admitting: Family Medicine

## 2014-11-06 ENCOUNTER — Encounter: Payer: Self-pay | Admitting: Family Medicine

## 2014-11-06 VITALS — BP 108/64 | HR 84 | Temp 97.9°F | Ht 61.0 in | Wt 142.8 lb

## 2014-11-06 DIAGNOSIS — Z0000 Encounter for general adult medical examination without abnormal findings: Secondary | ICD-10-CM

## 2014-11-06 DIAGNOSIS — I10 Essential (primary) hypertension: Secondary | ICD-10-CM | POA: Diagnosis not present

## 2014-11-06 LAB — CBC WITH DIFFERENTIAL/PLATELET
Basophils Absolute: 0 10*3/uL (ref 0.0–0.1)
Basophils Relative: 0.6 % (ref 0.0–3.0)
Eosinophils Absolute: 0 10*3/uL (ref 0.0–0.7)
Eosinophils Relative: 0.9 % (ref 0.0–5.0)
HCT: 33.5 % — ABNORMAL LOW (ref 36.0–46.0)
Hemoglobin: 11.1 g/dL — ABNORMAL LOW (ref 12.0–15.0)
Lymphocytes Relative: 37.7 % (ref 12.0–46.0)
Lymphs Abs: 1.8 10*3/uL (ref 0.7–4.0)
MCHC: 33 g/dL (ref 30.0–36.0)
MCV: 85.7 fl (ref 78.0–100.0)
Monocytes Absolute: 0.4 10*3/uL (ref 0.1–1.0)
Monocytes Relative: 8.8 % (ref 3.0–12.0)
Neutro Abs: 2.5 10*3/uL (ref 1.4–7.7)
Neutrophils Relative %: 52 % (ref 43.0–77.0)
Platelets: 198 10*3/uL (ref 150.0–400.0)
RBC: 3.91 Mil/uL (ref 3.87–5.11)
RDW: 14.5 % (ref 11.5–15.5)
WBC: 4.8 10*3/uL (ref 4.0–10.5)

## 2014-11-06 LAB — LIPID PANEL
Cholesterol: 189 mg/dL (ref 0–200)
HDL: 61.2 mg/dL (ref >39.00)
LDL Cholesterol: 115 mg/dL — ABNORMAL HIGH (ref 0–99)
NonHDL: 127.8
Total CHOL/HDL Ratio: 3
Triglycerides: 63 mg/dL (ref 0.0–149.0)
VLDL: 12.6 mg/dL (ref 0.0–40.0)

## 2014-11-06 LAB — BASIC METABOLIC PANEL
BUN: 15 mg/dL (ref 6–23)
CO2: 29 mEq/L (ref 19–32)
Calcium: 8.9 mg/dL (ref 8.4–10.5)
Chloride: 103 mEq/L (ref 96–112)
Creatinine, Ser: 0.65 mg/dL (ref 0.40–1.20)
GFR: 103.95 mL/min (ref >60.00)
Glucose, Bld: 85 mg/dL (ref 70–99)
Potassium: 4.2 mEq/L (ref 3.5–5.1)
Sodium: 136 mEq/L (ref 135–145)

## 2014-11-06 LAB — HEPATIC FUNCTION PANEL
ALT: 14 U/L (ref 0–35)
AST: 15 U/L (ref 0–37)
Albumin: 3.7 g/dL (ref 3.5–5.2)
Alkaline Phosphatase: 63 U/L (ref 39–117)
Bilirubin, Direct: 0.1 mg/dL (ref 0.0–0.3)
Total Bilirubin: 0.3 mg/dL (ref 0.2–1.2)
Total Protein: 6.5 g/dL (ref 6.0–8.3)

## 2014-11-06 LAB — TSH: TSH: 0.99 u[IU]/mL (ref 0.35–4.50)

## 2014-11-06 MED ORDER — ALPRAZOLAM 0.5 MG PO TABS: ORAL_TABLET | ORAL | Status: DC

## 2014-11-06 MED ORDER — LISINOPRIL 10 MG PO TABS: 10.0000 mg | ORAL_TABLET | Freq: Every day | ORAL | Status: DC

## 2014-11-06 NOTE — Progress Notes (Signed)
Subjective:     Katelyn Bailey is a 49 y.o. female and is here for a comprehensive physical exam. The patient reports problems - tired and low bp.  History   Social History  . Marital Status: Married    Spouse Name: william  . Number of Children: 2  . Years of Education: N/A   Occupational History  . plant sanitation     [employer]   Social History Main Topics  . Smoking status: Never Smoker   . Smokeless tobacco: Never Used  . Alcohol Use: No  . Drug Use: No  . Sexual Activity: Not on file   Other Topics Concern  . Not on file   Social History Narrative   Married x 18 years   2 sons--one son died suddenly in 2006-11-16 with no known cause   Health Maintenance  Topic Date Due  . HIV Screening  11/06/2015 (Originally 02/04/1981)  . INFLUENZA VACCINE  03/01/2015  . PAP SMEAR  12/25/2016  . TETANUS/TDAP  01/04/2022    The following portions of the patient's history were reviewed and updated as appropriate:  She  has a past medical history of Hypercholesterolemia; Overweight(278.02); Dizziness; Occipital neuralgia; Headache(784.0); Neuralgia; Hiatal hernia; Acid reflux disease; Essential hypertension; Kidney stone; and Complication of anesthesia. She  does not have any pertinent problems on file. 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 Her family history includes Cancer in her mother; Heart disease in her father and paternal grandfather. She  reports that she has never smoked. She has never used smokeless tobacco. She reports that she does not drink alcohol or use illicit drugs. She has a current medication list which includes the following prescription(s): alprazolam, calcium citrate-vitamin d, cyanocobalamin, iron, multivitamin with minerals, and lisinopril. Current Outpatient Prescriptions on File Prior to Visit   Medication Sig Dispense Refill  . CALCIUM CITRATE-VITAMIN D PO Take 1 tablet by mouth 3 (three) times daily.    . Cyanocobalamin (VITAMIN B-12 PO) Take 1 tablet by mouth daily.    . IRON PO Take 325 mg by mouth daily.     . Multiple Vitamin (MULTIVITAMIN WITH MINERALS) TABS Take 1 tablet by mouth 2 (two) times daily.    . [DISCONTINUED] topiramate (TOPAMAX) 25 MG capsule Take one tablet by mouth at bedtime x 1 week then 1 po bid thereafter. 60 capsule 5   No current facility-administered medications on file prior to visit.   She has No Known Allergies..  Review of Systems Review of Systems  Constitutional: Negative for activity change, appetite change and fatigue.  HENT: Negative for hearing loss, congestion, tinnitus and ear discharge.  dentist q23m Eyes: Negative for visual disturbance (see optho q1y -- vision corrected to 20/20 with glasses).  Respiratory: Negative for cough, chest tightness and shortness of breath.   Cardiovascular: Negative for chest pain, palpitations and leg swelling.  Gastrointestinal: Negative for abdominal pain, diarrhea, constipation and abdominal distention.  Genitourinary: Negative for urgency, frequency, decreased urine volume and difficulty urinating.  Musculoskeletal: Negative for back pain, arthralgias and gait problem.  Skin: Negative for color change, pallor and rash.  Neurological: Negative for dizziness, light-headedness, numbness and headaches.  Hematological: Negative for adenopathy. Does not bruise/bleed easily.  Psychiatric/Behavioral: Negative for suicidal ideas, confusion, sleep disturbance, self-injury, dysphoric mood, decreased concentration and agitation.       Objective:    BP 108/64 mmHg  Pulse 84  Temp(Src) 97.9 F (36.6 C) (Oral)  Ht 5\' 1"  (1.549 m)  Wt 142 lb 12.8 oz (64.774 kg)  BMI 27.00 kg/m2  SpO2 98%  LMP 11/08/2010 General appearance: alert, cooperative, appears stated age and no distress Head: Normocephalic, without  obvious abnormality, atraumatic Eyes: conjunctivae/corneas clear. PERRL, EOM's intact. Fundi benign. Ears: normal TM's and external ear canals both ears Nose: Nares normal. Septum midline. Mucosa normal. No drainage or sinus tenderness. Throat: lips, mucosa, and tongue normal; teeth and gums normal Neck: no adenopathy, no carotid bruit, no JVD, supple, symmetrical, trachea midline and thyroid not enlarged, symmetric, no tenderness/mass/nodules Back: symmetric, no curvature. ROM normal. No CVA tenderness. Lungs: clear to auscultation bilaterally Breasts: gyn Heart: regular rate and rhythm, S1, S2 normal, no murmur, click, rub or gallop Abdomen: soft, non-tender; bowel sounds normal; no masses,  no organomegaly Pelvic: deferred--gyn Extremities: extremities normal, atraumatic, no cyanosis or edema Pulses: 2+ and symmetric Skin: Skin color, texture, turgor normal. No rashes or lesions Lymph nodes: Cervical, supraclavicular, and axillary nodes normal. Neurologic: Alert and oriented X 3, normal strength and tone. Normal symmetric reflexes. Normal coordination and gait Psych- no depression, no anxiety      Assessment:    Healthy female exam.       Plan:  ghm utd  Check labs   See After Visit Summary for Counseling Recommendations   1. Essential hypertension Change to lisinopril 10 mg due to low bp  2. Preventative health care   - Lipid panel - Hepatic function panel - Basic metabolic panel - CBC with Differential/Platelet - TSH

## 2014-11-06 NOTE — Progress Notes (Signed)
Pre visit review using our clinic review tool, if applicable. No additional management support is needed unless otherwise documented below in the visit note. 

## 2014-11-06 NOTE — Patient Instructions (Signed)
Preventive Care for Adults A healthy lifestyle and preventive care can promote health and wellness. Preventive health guidelines for women include the following key practices.  A routine yearly physical is a good way to check with your health care provider about your health and preventive screening. It is a chance to share any concerns and updates on your health and to receive a thorough exam.  Visit your dentist for a routine exam and preventive care every 6 months. Brush your teeth twice a day and floss once a day. Good oral hygiene prevents tooth decay and gum disease.  The frequency of eye exams is based on your age, health, family medical history, use of contact lenses, and other factors. Follow your health care provider's recommendations for frequency of eye exams.  Eat a healthy diet. Foods like vegetables, fruits, whole grains, low-fat dairy products, and lean protein foods contain the nutrients you need without too many calories. Decrease your intake of foods high in solid fats, added sugars, and salt. Eat the right amount of calories for you.Get information about a proper diet from your health care provider, if necessary.  Regular physical exercise is one of the most important things you can do for your health. Most adults should get at least 150 minutes of moderate-intensity exercise (any activity that increases your heart rate and causes you to sweat) each week. In addition, most adults need muscle-strengthening exercises on 2 or more days a week.  Maintain a healthy weight. The body mass index (BMI) is a screening tool to identify possible weight problems. It provides an estimate of body fat based on height and weight. Your health care provider can find your BMI and can help you achieve or maintain a healthy weight.For adults 20 years and older:  A BMI below 18.5 is considered underweight.  A BMI of 18.5 to 24.9 is normal.  A BMI of 25 to 29.9 is considered overweight.  A BMI of  30 and above is considered obese.  Maintain normal blood lipids and cholesterol levels by exercising and minimizing your intake of saturated fat. Eat a balanced diet with plenty of fruit and vegetables. Blood tests for lipids and cholesterol should begin at age 76 and be repeated every 5 years. If your lipid or cholesterol levels are high, you are over 50, or you are at high risk for heart disease, you may need your cholesterol levels checked more frequently.Ongoing high lipid and cholesterol levels should be treated with medicines if diet and exercise are not working.  If you smoke, find out from your health care provider how to quit. If you do not use tobacco, do not start.  Lung cancer screening is recommended for adults aged 22-80 years who are at high risk for developing lung cancer because of a history of smoking. A yearly low-dose CT scan of the lungs is recommended for people who have at least a 30-pack-year history of smoking and are a current smoker or have quit within the past 15 years. A pack year of smoking is smoking an average of 1 pack of cigarettes a day for 1 year (for example: 1 pack a day for 30 years or 2 packs a day for 15 years). Yearly screening should continue until the smoker has stopped smoking for at least 15 years. Yearly screening should be stopped for people who develop a health problem that would prevent them from having lung cancer treatment.  If you are pregnant, do not drink alcohol. If you are breastfeeding,  be very cautious about drinking alcohol. If you are not pregnant and choose to drink alcohol, do not have more than 1 drink per day. One drink is considered to be 12 ounces (355 mL) of beer, 5 ounces (148 mL) of wine, or 1.5 ounces (44 mL) of liquor.  Avoid use of street drugs. Do not share needles with anyone. Ask for help if you need support or instructions about stopping the use of drugs.  High blood pressure causes heart disease and increases the risk of  stroke. Your blood pressure should be checked at least every 1 to 2 years. Ongoing high blood pressure should be treated with medicines if weight loss and exercise do not work.  If you are 75-52 years old, ask your health care provider if you should take aspirin to prevent strokes.  Diabetes screening involves taking a blood sample to check your fasting blood sugar level. This should be done once every 3 years, after age 15, if you are within normal weight and without risk factors for diabetes. Testing should be considered at a younger age or be carried out more frequently if you are overweight and have at least 1 risk factor for diabetes.  Breast cancer screening is essential preventive care for women. You should practice "breast self-awareness." This means understanding the normal appearance and feel of your breasts and may include breast self-examination. Any changes detected, no matter how small, should be reported to a health care provider. Women in their 58s and 30s should have a clinical breast exam (CBE) by a health care provider as part of a regular health exam every 1 to 3 years. After age 16, women should have a CBE every year. Starting at age 53, women should consider having a mammogram (breast X-ray test) every year. Women who have a family history of breast cancer should talk to their health care provider about genetic screening. Women at a high risk of breast cancer should talk to their health care providers about having an MRI and a mammogram every year.  Breast cancer gene (BRCA)-related cancer risk assessment is recommended for women who have family members with BRCA-related cancers. BRCA-related cancers include breast, ovarian, tubal, and peritoneal cancers. Having family members with these cancers may be associated with an increased risk for harmful changes (mutations) in the breast cancer genes BRCA1 and BRCA2. Results of the assessment will determine the need for genetic counseling and  BRCA1 and BRCA2 testing.  Routine pelvic exams to screen for cancer are no longer recommended for nonpregnant women who are considered low risk for cancer of the pelvic organs (ovaries, uterus, and vagina) and who do not have symptoms. Ask your health care provider if a screening pelvic exam is right for you.  If you have had past treatment for cervical cancer or a condition that could lead to cancer, you need Pap tests and screening for cancer for at least 20 years after your treatment. If Pap tests have been discontinued, your risk factors (such as having a new sexual partner) need to be reassessed to determine if screening should be resumed. Some women have medical problems that increase the chance of getting cervical cancer. In these cases, your health care provider may recommend more frequent screening and Pap tests.  The HPV test is an additional test that may be used for cervical cancer screening. The HPV test looks for the virus that can cause the cell changes on the cervix. The cells collected during the Pap test can be  tested for HPV. The HPV test could be used to screen women aged 30 years and older, and should be used in women of any age who have unclear Pap test results. After the age of 30, women should have HPV testing at the same frequency as a Pap test.  Colorectal cancer can be detected and often prevented. Most routine colorectal cancer screening begins at the age of 50 years and continues through age 75 years. However, your health care provider may recommend screening at an earlier age if you have risk factors for colon cancer. On a yearly basis, your health care provider may provide home test kits to check for hidden blood in the stool. Use of a small camera at the end of a tube, to directly examine the colon (sigmoidoscopy or colonoscopy), can detect the earliest forms of colorectal cancer. Talk to your health care provider about this at age 50, when routine screening begins. Direct  exam of the colon should be repeated every 5-10 years through age 75 years, unless early forms of pre-cancerous polyps or small growths are found.  People who are at an increased risk for hepatitis B should be screened for this virus. You are considered at high risk for hepatitis B if:  You were born in a country where hepatitis B occurs often. Talk with your health care provider about which countries are considered high risk.  Your parents were born in a high-risk country and you have not received a shot to protect against hepatitis B (hepatitis B vaccine).  You have HIV or AIDS.  You use needles to inject street drugs.  You live with, or have sex with, someone who has hepatitis B.  You get hemodialysis treatment.  You take certain medicines for conditions like cancer, organ transplantation, and autoimmune conditions.  Hepatitis C blood testing is recommended for all people born from 1945 through 1965 and any individual with known risks for hepatitis C.  Practice safe sex. Use condoms and avoid high-risk sexual practices to reduce the spread of sexually transmitted infections (STIs). STIs include gonorrhea, chlamydia, syphilis, trichomonas, herpes, HPV, and human immunodeficiency virus (HIV). Herpes, HIV, and HPV are viral illnesses that have no cure. They can result in disability, cancer, and death.  You should be screened for sexually transmitted illnesses (STIs) including gonorrhea and chlamydia if:  You are sexually active and are younger than 24 years.  You are older than 24 years and your health care provider tells you that you are at risk for this type of infection.  Your sexual activity has changed since you were last screened and you are at an increased risk for chlamydia or gonorrhea. Ask your health care provider if you are at risk.  If you are at risk of being infected with HIV, it is recommended that you take a prescription medicine daily to prevent HIV infection. This is  called preexposure prophylaxis (PrEP). You are considered at risk if:  You are a heterosexual woman, are sexually active, and are at increased risk for HIV infection.  You take drugs by injection.  You are sexually active with a partner who has HIV.  Talk with your health care provider about whether you are at high risk of being infected with HIV. If you choose to begin PrEP, you should first be tested for HIV. You should then be tested every 3 months for as long as you are taking PrEP.  Osteoporosis is a disease in which the bones lose minerals and strength   with aging. This can result in serious bone fractures or breaks. The risk of osteoporosis can be identified using a bone density scan. Women ages 65 years and over and women at risk for fractures or osteoporosis should discuss screening with their health care providers. Ask your health care provider whether you should take a calcium supplement or vitamin D to reduce the rate of osteoporosis.  Menopause can be associated with physical symptoms and risks. Hormone replacement therapy is available to decrease symptoms and risks. You should talk to your health care provider about whether hormone replacement therapy is right for you.  Use sunscreen. Apply sunscreen liberally and repeatedly throughout the day. You should seek shade when your shadow is shorter than you. Protect yourself by wearing long sleeves, pants, a wide-brimmed hat, and sunglasses year round, whenever you are outdoors.  Once a month, do a whole body skin exam, using a mirror to look at the skin on your back. Tell your health care provider of new moles, moles that have irregular borders, moles that are larger than a pencil eraser, or moles that have changed in shape or color.  Stay current with required vaccines (immunizations).  Influenza vaccine. All adults should be immunized every year.  Tetanus, diphtheria, and acellular pertussis (Td, Tdap) vaccine. Pregnant women should  receive 1 dose of Tdap vaccine during each pregnancy. The dose should be obtained regardless of the length of time since the last dose. Immunization is preferred during the 27th-36th week of gestation. An adult who has not previously received Tdap or who does not know her vaccine status should receive 1 dose of Tdap. This initial dose should be followed by tetanus and diphtheria toxoids (Td) booster doses every 10 years. Adults with an unknown or incomplete history of completing a 3-dose immunization series with Td-containing vaccines should begin or complete a primary immunization series including a Tdap dose. Adults should receive a Td booster every 10 years.  Varicella vaccine. An adult without evidence of immunity to varicella should receive 2 doses or a second dose if she has previously received 1 dose. Pregnant females who do not have evidence of immunity should receive the first dose after pregnancy. This first dose should be obtained before leaving the health care facility. The second dose should be obtained 4-8 weeks after the first dose.  Human papillomavirus (HPV) vaccine. Females aged 13-26 years who have not received the vaccine previously should obtain the 3-dose series. The vaccine is not recommended for use in pregnant females. However, pregnancy testing is not needed before receiving a dose. If a female is found to be pregnant after receiving a dose, no treatment is needed. In that case, the remaining doses should be delayed until after the pregnancy. Immunization is recommended for any person with an immunocompromised condition through the age of 26 years if she did not get any or all doses earlier. During the 3-dose series, the second dose should be obtained 4-8 weeks after the first dose. The third dose should be obtained 24 weeks after the first dose and 16 weeks after the second dose.  Zoster vaccine. One dose is recommended for adults aged 60 years or older unless certain conditions are  present.  Measles, mumps, and rubella (MMR) vaccine. Adults born before 1957 generally are considered immune to measles and mumps. Adults born in 1957 or later should have 1 or more doses of MMR vaccine unless there is a contraindication to the vaccine or there is laboratory evidence of immunity to   each of the three diseases. A routine second dose of MMR vaccine should be obtained at least 28 days after the first dose for students attending postsecondary schools, health care workers, or international travelers. People who received inactivated measles vaccine or an unknown type of measles vaccine during 1963-1967 should receive 2 doses of MMR vaccine. People who received inactivated mumps vaccine or an unknown type of mumps vaccine before 1979 and are at high risk for mumps infection should consider immunization with 2 doses of MMR vaccine. For females of childbearing age, rubella immunity should be determined. If there is no evidence of immunity, females who are not pregnant should be vaccinated. If there is no evidence of immunity, females who are pregnant should delay immunization until after pregnancy. Unvaccinated health care workers born before 1957 who lack laboratory evidence of measles, mumps, or rubella immunity or laboratory confirmation of disease should consider measles and mumps immunization with 2 doses of MMR vaccine or rubella immunization with 1 dose of MMR vaccine.  Pneumococcal 13-valent conjugate (PCV13) vaccine. When indicated, a person who is uncertain of her immunization history and has no record of immunization should receive the PCV13 vaccine. An adult aged 19 years or older who has certain medical conditions and has not been previously immunized should receive 1 dose of PCV13 vaccine. This PCV13 should be followed with a dose of pneumococcal polysaccharide (PPSV23) vaccine. The PPSV23 vaccine dose should be obtained at least 8 weeks after the dose of PCV13 vaccine. An adult aged 19  years or older who has certain medical conditions and previously received 1 or more doses of PPSV23 vaccine should receive 1 dose of PCV13. The PCV13 vaccine dose should be obtained 1 or more years after the last PPSV23 vaccine dose.  Pneumococcal polysaccharide (PPSV23) vaccine. When PCV13 is also indicated, PCV13 should be obtained first. All adults aged 65 years and older should be immunized. An adult younger than age 65 years who has certain medical conditions should be immunized. Any person who resides in a nursing home or long-term care facility should be immunized. An adult smoker should be immunized. People with an immunocompromised condition and certain other conditions should receive both PCV13 and PPSV23 vaccines. People with human immunodeficiency virus (HIV) infection should be immunized as soon as possible after diagnosis. Immunization during chemotherapy or radiation therapy should be avoided. Routine use of PPSV23 vaccine is not recommended for American Indians, [place] Natives, or people younger than 65 years unless there are medical conditions that require PPSV23 vaccine. When indicated, people who have unknown immunization and have no record of immunization should receive PPSV23 vaccine. One-time revaccination 5 years after the first dose of PPSV23 is recommended for people aged 19-64 years who have chronic kidney failure, nephrotic syndrome, asplenia, or immunocompromised conditions. People who received 1-2 doses of PPSV23 before age 65 years should receive another dose of PPSV23 vaccine at age 65 years or later if at least 5 years have passed since the previous dose. Doses of PPSV23 are not needed for people immunized with PPSV23 at or after age 65 years.  Meningococcal vaccine. Adults with asplenia or persistent complement component deficiencies should receive 2 doses of quadrivalent meningococcal conjugate (MenACWY-D) vaccine. The doses should be obtained at least 2 months apart.  Microbiologists working with certain meningococcal bacteria, military recruits, people at risk during an outbreak, and people who travel to or live in countries with a high rate of meningitis should be immunized. A first-year college student up through age   21 years who is living in a residence hall should receive a dose if she did not receive a dose on or after her 16th birthday. Adults who have certain high-risk conditions should receive one or more doses of vaccine.  Hepatitis A vaccine. Adults who wish to be protected from this disease, have certain high-risk conditions, work with hepatitis A-infected animals, work in hepatitis A research labs, or travel to or work in countries with a high rate of hepatitis A should be immunized. Adults who were previously unvaccinated and who anticipate close contact with an international adoptee during the first 60 days after arrival in the [place] from a country with a high rate of hepatitis A should be immunized.  Hepatitis B vaccine. Adults who wish to be protected from this disease, have certain high-risk conditions, may be exposed to blood or other infectious body fluids, are household contacts or sex partners of hepatitis B positive people, are clients or workers in certain care facilities, or travel to or work in countries with a high rate of hepatitis B should be immunized.  Haemophilus influenzae type b (Hib) vaccine. A previously unvaccinated person with asplenia or sickle cell disease or having a scheduled splenectomy should receive 1 dose of Hib vaccine. Regardless of previous immunization, a recipient of a hematopoietic stem cell transplant should receive a 3-dose series 6-12 months after her successful transplant. Hib vaccine is not recommended for adults with HIV infection. Preventive Services / Frequency Ages 64 to 68 years  Blood pressure check.** / Every 1 to 2 years.  Lipid and cholesterol check.** / Every 5 years beginning at age  22.  Clinical breast exam.** / Every 3 years for women in their 88s and 53s.  BRCA-related cancer risk assessment.** / For women who have family members with a BRCA-related cancer (breast, ovarian, tubal, or peritoneal cancers).  Pap test.** / Every 2 years from ages 90 through 51. Every 3 years starting at age 21 through age 56 or 3 with a history of 3 consecutive normal Pap tests.  HPV screening.** / Every 3 years from ages 24 through ages 1 to 46 with a history of 3 consecutive normal Pap tests.  Hepatitis C blood test.** / For any individual with known risks for hepatitis C.  Skin self-exam. / Monthly.  Influenza vaccine. / Every year.  Tetanus, diphtheria, and acellular pertussis (Tdap, Td) vaccine.** / Consult your health care provider. Pregnant women should receive 1 dose of Tdap vaccine during each pregnancy. 1 dose of Td every 10 years.  Varicella vaccine.** / Consult your health care provider. Pregnant females who do not have evidence of immunity should receive the first dose after pregnancy.  HPV vaccine. / 3 doses over 6 months, if 72 and younger. The vaccine is not recommended for use in pregnant females. However, pregnancy testing is not needed before receiving a dose.  Measles, mumps, rubella (MMR) vaccine.** / You need at least 1 dose of MMR if you were born in 1957 or later. You may also need a 2nd dose. For females of childbearing age, rubella immunity should be determined. If there is no evidence of immunity, females who are not pregnant should be vaccinated. If there is no evidence of immunity, females who are pregnant should delay immunization until after pregnancy.  Pneumococcal 13-valent conjugate (PCV13) vaccine.** / Consult your health care provider.  Pneumococcal polysaccharide (PPSV23) vaccine.** / 1 to 2 doses if you smoke cigarettes or if you have certain conditions.  Meningococcal vaccine.** /  1 dose if you are age 19 to 21 years and a first-year college  student living in a residence hall, or have one of several medical conditions, you need to get vaccinated against meningococcal disease. You may also need additional booster doses.  Hepatitis A vaccine.** / Consult your health care provider.  Hepatitis B vaccine.** / Consult your health care provider.  Haemophilus influenzae type b (Hib) vaccine.** / Consult your health care provider. Ages 40 to 64 years  Blood pressure check.** / Every 1 to 2 years.  Lipid and cholesterol check.** / Every 5 years beginning at age 20 years.  Lung cancer screening. / Every year if you are aged 55-80 years and have a 30-pack-year history of smoking and currently smoke or have quit within the past 15 years. Yearly screening is stopped once you have quit smoking for at least 15 years or develop a health problem that would prevent you from having lung cancer treatment.  Clinical breast exam.** / Every year after age 40 years.  BRCA-related cancer risk assessment.** / For women who have family members with a BRCA-related cancer (breast, ovarian, tubal, or peritoneal cancers).  Mammogram.** / Every year beginning at age 40 years and continuing for as long as you are in good health. Consult with your health care provider.  Pap test.** / Every 3 years starting at age 30 years through age 65 or 70 years with a history of 3 consecutive normal Pap tests.  HPV screening.** / Every 3 years from ages 30 years through ages 65 to 70 years with a history of 3 consecutive normal Pap tests.  Fecal occult blood test (FOBT) of stool. / Every year beginning at age 50 years and continuing until age 75 years. You may not need to do this test if you get a colonoscopy every 10 years.  Flexible sigmoidoscopy or colonoscopy.** / Every 5 years for a flexible sigmoidoscopy or every 10 years for a colonoscopy beginning at age 50 years and continuing until age 75 years.  Hepatitis C blood test.** / For all people born from 1945 through  1965 and any individual with known risks for hepatitis C.  Skin self-exam. / Monthly.  Influenza vaccine. / Every year.  Tetanus, diphtheria, and acellular pertussis (Tdap/Td) vaccine.** / Consult your health care provider. Pregnant women should receive 1 dose of Tdap vaccine during each pregnancy. 1 dose of Td every 10 years.  Varicella vaccine.** / Consult your health care provider. Pregnant females who do not have evidence of immunity should receive the first dose after pregnancy.  Zoster vaccine.** / 1 dose for adults aged 60 years or older.  Measles, mumps, rubella (MMR) vaccine.** / You need at least 1 dose of MMR if you were born in 1957 or later. You may also need a 2nd dose. For females of childbearing age, rubella immunity should be determined. If there is no evidence of immunity, females who are not pregnant should be vaccinated. If there is no evidence of immunity, females who are pregnant should delay immunization until after pregnancy.  Pneumococcal 13-valent conjugate (PCV13) vaccine.** / Consult your health care provider.  Pneumococcal polysaccharide (PPSV23) vaccine.** / 1 to 2 doses if you smoke cigarettes or if you have certain conditions.  Meningococcal vaccine.** / Consult your health care provider.  Hepatitis A vaccine.** / Consult your health care provider.  Hepatitis B vaccine.** / Consult your health care provider.  Haemophilus influenzae type b (Hib) vaccine.** / Consult your health care provider. Ages 65   years and over  Blood pressure check.** / Every 1 to 2 years.  Lipid and cholesterol check.** / Every 5 years beginning at age 22 years.  Lung cancer screening. / Every year if you are aged 73-80 years and have a 30-pack-year history of smoking and currently smoke or have quit within the past 15 years. Yearly screening is stopped once you have quit smoking for at least 15 years or develop a health problem that would prevent you from having lung cancer  treatment.  Clinical breast exam.** / Every year after age 4 years.  BRCA-related cancer risk assessment.** / For women who have family members with a BRCA-related cancer (breast, ovarian, tubal, or peritoneal cancers).  Mammogram.** / Every year beginning at age 40 years and continuing for as long as you are in good health. Consult with your health care provider.  Pap test.** / Every 3 years starting at age 9 years through age 34 or 91 years with 3 consecutive normal Pap tests. Testing can be stopped between 65 and 70 years with 3 consecutive normal Pap tests and no abnormal Pap or HPV tests in the past 10 years.  HPV screening.** / Every 3 years from ages 57 years through ages 64 or 45 years with a history of 3 consecutive normal Pap tests. Testing can be stopped between 65 and 70 years with 3 consecutive normal Pap tests and no abnormal Pap or HPV tests in the past 10 years.  Fecal occult blood test (FOBT) of stool. / Every year beginning at age 15 years and continuing until age 17 years. You may not need to do this test if you get a colonoscopy every 10 years.  Flexible sigmoidoscopy or colonoscopy.** / Every 5 years for a flexible sigmoidoscopy or every 10 years for a colonoscopy beginning at age 86 years and continuing until age 71 years.  Hepatitis C blood test.** / For all people born from [birth year] through 1963 and any individual with known risks for hepatitis C.  Osteoporosis screening.** / A one-time screening for women ages 83 years and over and women at risk for fractures or osteoporosis.  Skin self-exam. / Monthly.  Influenza vaccine. / Every year.  Tetanus, diphtheria, and acellular pertussis (Tdap/Td) vaccine.** / 1 dose of Td every 10 years.  Varicella vaccine.** / Consult your health care provider.  Zoster vaccine.** / 1 dose for adults aged 61 years or older.  Pneumococcal 13-valent conjugate (PCV13) vaccine.** / Consult your health care provider.  Pneumococcal  polysaccharide (PPSV23) vaccine.** / 1 dose for all adults aged 28 years and older.  Meningococcal vaccine.** / Consult your health care provider.  Hepatitis A vaccine.** / Consult your health care provider.  Hepatitis B vaccine.** / Consult your health care provider.  Haemophilus influenzae type b (Hib) vaccine.** / Consult your health care provider. ** Family history and personal history of risk and conditions may change your health care provider's recommendations. Document Released: 09/12/2001 Document Revised: 12/01/2013 Document Reviewed: 12/12/2010 [facility] Patient Information 2015 [place], [place]. This information is not intended to replace advice given to you by your health care provider. Make sure you discuss any questions you have with your health care provider.

## 2015-08-13 ENCOUNTER — Encounter: Payer: Self-pay | Admitting: Family Medicine

## 2015-10-21 ENCOUNTER — Other Ambulatory Visit: Payer: Self-pay | Admitting: Family Medicine

## 2015-12-20 ENCOUNTER — Encounter: Payer: BLUE CROSS/BLUE SHIELD | Admitting: Family Medicine

## 2016-01-19 ENCOUNTER — Other Ambulatory Visit: Payer: Self-pay | Admitting: Family Medicine

## 2016-01-19 NOTE — Telephone Encounter (Signed)
Rx sent to the pharmacy by e-script.//AB/CMA 

## 2016-03-20 ENCOUNTER — Encounter: Payer: Self-pay | Admitting: Family Medicine

## 2016-03-27 ENCOUNTER — Ambulatory Visit (INDEPENDENT_AMBULATORY_CARE_PROVIDER_SITE_OTHER): Payer: BLUE CROSS/BLUE SHIELD | Admitting: Family Medicine

## 2016-03-27 ENCOUNTER — Encounter: Payer: Self-pay | Admitting: Family Medicine

## 2016-03-27 VITALS — BP 108/64 | HR 63 | Temp 98.0°F | Ht 61.75 in | Wt 148.2 lb

## 2016-03-27 DIAGNOSIS — Z1231 Encounter for screening mammogram for malignant neoplasm of breast: Secondary | ICD-10-CM

## 2016-03-27 DIAGNOSIS — I10 Essential (primary) hypertension: Secondary | ICD-10-CM

## 2016-03-27 DIAGNOSIS — F411 Generalized anxiety disorder: Secondary | ICD-10-CM

## 2016-03-27 DIAGNOSIS — Z0000 Encounter for general adult medical examination without abnormal findings: Secondary | ICD-10-CM

## 2016-03-27 DIAGNOSIS — Z23 Encounter for immunization: Secondary | ICD-10-CM | POA: Diagnosis not present

## 2016-03-27 DIAGNOSIS — Z1239 Encounter for other screening for malignant neoplasm of breast: Secondary | ICD-10-CM

## 2016-03-27 LAB — CBC WITH DIFFERENTIAL/PLATELET
Basophils Absolute: 0 10*3/uL (ref 0.0–0.1)
Basophils Relative: 0.9 % (ref 0.0–3.0)
Eosinophils Absolute: 0 10*3/uL (ref 0.0–0.7)
Eosinophils Relative: 0.7 % (ref 0.0–5.0)
HCT: 33.8 % — ABNORMAL LOW (ref 36.0–46.0)
Hemoglobin: 10.6 g/dL — ABNORMAL LOW (ref 12.0–15.0)
Lymphocytes Relative: 34.6 % (ref 12.0–46.0)
Lymphs Abs: 1.8 10*3/uL (ref 0.7–4.0)
MCHC: 31.5 g/dL (ref 30.0–36.0)
MCV: 80.1 fl (ref 78.0–100.0)
Monocytes Absolute: 0.4 10*3/uL (ref 0.1–1.0)
Monocytes Relative: 7.6 % (ref 3.0–12.0)
Neutro Abs: 2.9 10*3/uL (ref 1.4–7.7)
Neutrophils Relative %: 56.2 % (ref 43.0–77.0)
Platelets: 201 10*3/uL (ref 150.0–400.0)
RBC: 4.22 Mil/uL (ref 3.87–5.11)
RDW: 16.5 % — ABNORMAL HIGH (ref 11.5–15.5)
WBC: 5.2 10*3/uL (ref 4.0–10.5)

## 2016-03-27 LAB — COMPREHENSIVE METABOLIC PANEL
ALT: 14 U/L (ref 0–35)
AST: 18 U/L (ref 0–37)
Albumin: 3.9 g/dL (ref 3.5–5.2)
Alkaline Phosphatase: 65 U/L (ref 39–117)
BUN: 11 mg/dL (ref 6–23)
CO2: 28 mEq/L (ref 19–32)
Calcium: 8.5 mg/dL (ref 8.4–10.5)
Chloride: 105 mEq/L (ref 96–112)
Creatinine, Ser: 0.6 mg/dL (ref 0.40–1.20)
GFR: 113.34 mL/min (ref >60.00)
Glucose, Bld: 89 mg/dL (ref 70–99)
Potassium: 4.1 mEq/L (ref 3.5–5.1)
Sodium: 139 mEq/L (ref 135–145)
Total Bilirubin: 0.3 mg/dL (ref 0.2–1.2)
Total Protein: 6.5 g/dL (ref 6.0–8.3)

## 2016-03-27 LAB — TSH: TSH: 1.1 u[IU]/mL (ref 0.35–4.50)

## 2016-03-27 LAB — LIPID PANEL
Cholesterol: 189 mg/dL (ref 0–200)
HDL: 59.1 mg/dL (ref >39.00)
LDL Cholesterol: 113 mg/dL — ABNORMAL HIGH (ref 0–99)
NonHDL: 129.81
Total CHOL/HDL Ratio: 3
Triglycerides: 82 mg/dL (ref 0.0–149.0)
VLDL: 16.4 mg/dL (ref 0.0–40.0)

## 2016-03-27 MED ORDER — LISINOPRIL 10 MG PO TABS: 10.0000 mg | ORAL_TABLET | Freq: Every day | ORAL | 1 refills | Status: DC

## 2016-03-27 MED ORDER — ALPRAZOLAM 0.5 MG PO TABS: ORAL_TABLET | ORAL | 1 refills | Status: DC

## 2016-03-27 NOTE — Progress Notes (Signed)
Pre visit review using our clinic review tool, if applicable. No additional management support is needed unless otherwise documented below in the visit note. 

## 2016-03-27 NOTE — Patient Instructions (Signed)
Preventive Care for Adults, Female A healthy lifestyle and preventive care can promote health and wellness. Preventive health guidelines for women include the following key practices.  A routine yearly physical is a good way to check with your health care provider about your health and preventive screening. It is a chance to share any concerns and updates on your health and to receive a thorough exam.  Visit your dentist for a routine exam and preventive care every 6 months. Brush your teeth twice a day and floss once a day. Good oral hygiene prevents tooth decay and gum disease.  The frequency of eye exams is based on your age, health, family medical history, use of contact lenses, and other factors. Follow your health care provider's recommendations for frequency of eye exams.  Eat a healthy diet. Foods like vegetables, fruits, whole grains, low-fat dairy products, and lean protein foods contain the nutrients you need without too many calories. Decrease your intake of foods high in solid fats, added sugars, and salt. Eat the right amount of calories for you.Get information about a proper diet from your health care provider, if necessary.  Regular physical exercise is one of the most important things you can do for your health. Most adults should get at least 150 minutes of moderate-intensity exercise (any activity that increases your heart rate and causes you to sweat) each week. In addition, most adults need muscle-strengthening exercises on 2 or more days a week.  Maintain a healthy weight. The body mass index (BMI) is a screening tool to identify possible weight problems. It provides an estimate of body fat based on height and weight. Your health care provider can find your BMI and can help you achieve or maintain a healthy weight.For adults 20 years and older:  A BMI below 18.5 is considered underweight.  A BMI of 18.5 to 24.9 is normal.  A BMI of 25 to 29.9 is considered overweight.  A  BMI of 30 and above is considered obese.  Maintain normal blood lipids and cholesterol levels by exercising and minimizing your intake of saturated fat. Eat a balanced diet with plenty of fruit and vegetables. Blood tests for lipids and cholesterol should begin at age 45 and be repeated every 5 years. If your lipid or cholesterol levels are high, you are over 50, or you are at high risk for heart disease, you may need your cholesterol levels checked more frequently.Ongoing high lipid and cholesterol levels should be treated with medicines if diet and exercise are not working.  If you smoke, find out from your health care provider how to quit. If you do not use tobacco, do not start.  Lung cancer screening is recommended for adults aged 45-80 years who are at high risk for developing lung cancer because of a history of smoking. A yearly low-dose CT scan of the lungs is recommended for people who have at least a 30-pack-year history of smoking and are a current smoker or have quit within the past 15 years. A pack year of smoking is smoking an average of 1 pack of cigarettes a day for 1 year (for example: 1 pack a day for 30 years or 2 packs a day for 15 years). Yearly screening should continue until the smoker has stopped smoking for at least 15 years. Yearly screening should be stopped for people who develop a health problem that would prevent them from having lung cancer treatment.  If you are pregnant, do not drink alcohol. If you are  breastfeeding, be very cautious about drinking alcohol. If you are not pregnant and choose to drink alcohol, do not have more than 1 drink per day. One drink is considered to be 12 ounces (355 mL) of beer, 5 ounces (148 mL) of wine, or 1.5 ounces (44 mL) of liquor.  Avoid use of street drugs. Do not share needles with anyone. Ask for help if you need support or instructions about stopping the use of drugs.  High blood pressure causes heart disease and increases the risk  of stroke. Your blood pressure should be checked at least every 1 to 2 years. Ongoing high blood pressure should be treated with medicines if weight loss and exercise do not work.  If you are 55-79 years old, ask your health care provider if you should take aspirin to prevent strokes.  Diabetes screening is done by taking a blood sample to check your blood glucose level after you have not eaten for a certain period of time (fasting). If you are not overweight and you do not have risk factors for diabetes, you should be screened once every 3 years starting at age 45. If you are overweight or obese and you are 40-70 years of age, you should be screened for diabetes every year as part of your cardiovascular risk assessment.  Breast cancer screening is essential preventive care for women. You should practice "breast self-awareness." This means understanding the normal appearance and feel of your breasts and may include breast self-examination. Any changes detected, no matter how small, should be reported to a health care provider. Women in their 20s and 30s should have a clinical breast exam (CBE) by a health care provider as part of a regular health exam every 1 to 3 years. After age 40, women should have a CBE every year. Starting at age 40, women should consider having a mammogram (breast X-ray test) every year. Women who have a family history of breast cancer should talk to their health care provider about genetic screening. Women at a high risk of breast cancer should talk to their health care providers about having an MRI and a mammogram every year.  Breast cancer gene (BRCA)-related cancer risk assessment is recommended for women who have family members with BRCA-related cancers. BRCA-related cancers include breast, ovarian, tubal, and peritoneal cancers. Having family members with these cancers may be associated with an increased risk for harmful changes (mutations) in the breast cancer genes BRCA1 and  BRCA2. Results of the assessment will determine the need for genetic counseling and BRCA1 and BRCA2 testing.  Your health care provider may recommend that you be screened regularly for cancer of the pelvic organs (ovaries, uterus, and vagina). This screening involves a pelvic examination, including checking for microscopic changes to the surface of your cervix (Pap test). You may be encouraged to have this screening done every 3 years, beginning at age 21.  For women ages 30-65, health care providers may recommend pelvic exams and Pap testing every 3 years, or they may recommend the Pap and pelvic exam, combined with testing for human papilloma virus (HPV), every 5 years. Some types of HPV increase your risk of cervical cancer. Testing for HPV may also be done on women of any age with unclear Pap test results.  Other health care providers may not recommend any screening for nonpregnant women who are considered low risk for pelvic cancer and who do not have symptoms. Ask your health care provider if a screening pelvic exam is right for   you.  If you have had past treatment for cervical cancer or a condition that could lead to cancer, you need Pap tests and screening for cancer for at least 20 years after your treatment. If Pap tests have been discontinued, your risk factors (such as having a new sexual partner) need to be reassessed to determine if screening should resume. Some women have medical problems that increase the chance of getting cervical cancer. In these cases, your health care provider may recommend more frequent screening and Pap tests.  Colorectal cancer can be detected and often prevented. Most routine colorectal cancer screening begins at the age of 50 years and continues through age 75 years. However, your health care provider may recommend screening at an earlier age if you have risk factors for colon cancer. On a yearly basis, your health care provider may provide home test kits to check  for hidden blood in the stool. Use of a small camera at the end of a tube, to directly examine the colon (sigmoidoscopy or colonoscopy), can detect the earliest forms of colorectal cancer. Talk to your health care provider about this at age 50, when routine screening begins. Direct exam of the colon should be repeated every 5-10 years through age 75 years, unless early forms of precancerous polyps or small growths are found.  People who are at an increased risk for hepatitis B should be screened for this virus. You are considered at high risk for hepatitis B if:  You were born in a country where hepatitis B occurs often. Talk with your health care provider about which countries are considered high risk.  Your parents were born in a high-risk country and you have not received a shot to protect against hepatitis B (hepatitis B vaccine).  You have HIV or AIDS.  You use needles to inject street drugs.  You live with, or have sex with, someone who has hepatitis B.  You get hemodialysis treatment.  You take certain medicines for conditions like cancer, organ transplantation, and autoimmune conditions.  Hepatitis C blood testing is recommended for all people born from 1945 through 1965 and any individual with known risks for hepatitis C.  Practice safe sex. Use condoms and avoid high-risk sexual practices to reduce the spread of sexually transmitted infections (STIs). STIs include gonorrhea, chlamydia, syphilis, trichomonas, herpes, HPV, and human immunodeficiency virus (HIV). Herpes, HIV, and HPV are viral illnesses that have no cure. They can result in disability, cancer, and death.  You should be screened for sexually transmitted illnesses (STIs) including gonorrhea and chlamydia if:  You are sexually active and are younger than 24 years.  You are older than 24 years and your health care provider tells you that you are at risk for this type of infection.  Your sexual activity has changed  since you were last screened and you are at an increased risk for chlamydia or gonorrhea. Ask your health care provider if you are at risk.  If you are at risk of being infected with HIV, it is recommended that you take a prescription medicine daily to prevent HIV infection. This is called preexposure prophylaxis (PrEP). You are considered at risk if:  You are sexually active and do not regularly use condoms or know the HIV status of your partner(s).  You take drugs by injection.  You are sexually active with a partner who has HIV.  Talk with your health care provider about whether you are at high risk of being infected with HIV. If   you choose to begin PrEP, you should first be tested for HIV. You should then be tested every 3 months for as long as you are taking PrEP.  Osteoporosis is a disease in which the bones lose minerals and strength with aging. This can result in serious bone fractures or breaks. The risk of osteoporosis can be identified using a bone density scan. Women ages 67 years and over and women at risk for fractures or osteoporosis should discuss screening with their health care providers. Ask your health care provider whether you should take a calcium supplement or vitamin D to reduce the rate of osteoporosis.  Menopause can be associated with physical symptoms and risks. Hormone replacement therapy is available to decrease symptoms and risks. You should talk to your health care provider about whether hormone replacement therapy is right for you.  Use sunscreen. Apply sunscreen liberally and repeatedly throughout the day. You should seek shade when your shadow is shorter than you. Protect yourself by wearing long sleeves, pants, a wide-brimmed hat, and sunglasses year round, whenever you are outdoors.  Once a month, do a whole body skin exam, using a mirror to look at the skin on your back. Tell your health care provider of new moles, moles that have irregular borders, moles that  are larger than a pencil eraser, or moles that have changed in shape or color.  Stay current with required vaccines (immunizations).  Influenza vaccine. All adults should be immunized every year.  Tetanus, diphtheria, and acellular pertussis (Td, Tdap) vaccine. Pregnant women should receive 1 dose of Tdap vaccine during each pregnancy. The dose should be obtained regardless of the length of time since the last dose. Immunization is preferred during the 27th-36th week of gestation. An adult who has not previously received Tdap or who does not know her vaccine status should receive 1 dose of Tdap. This initial dose should be followed by tetanus and diphtheria toxoids (Td) booster doses every 10 years. Adults with an unknown or incomplete history of completing a 3-dose immunization series with Td-containing vaccines should begin or complete a primary immunization series including a Tdap dose. Adults should receive a Td booster every 10 years.  Varicella vaccine. An adult without evidence of immunity to varicella should receive 2 doses or a second dose if she has previously received 1 dose. Pregnant females who do not have evidence of immunity should receive the first dose after pregnancy. This first dose should be obtained before leaving the health care facility. The second dose should be obtained 4-8 weeks after the first dose.  Human papillomavirus (HPV) vaccine. Females aged 13-26 years who have not received the vaccine previously should obtain the 3-dose series. The vaccine is not recommended for use in pregnant females. However, pregnancy testing is not needed before receiving a dose. If a female is found to be pregnant after receiving a dose, no treatment is needed. In that case, the remaining doses should be delayed until after the pregnancy. Immunization is recommended for any person with an immunocompromised condition through the age of 61 years if she did not get any or all doses earlier. During the  3-dose series, the second dose should be obtained 4-8 weeks after the first dose. The third dose should be obtained 24 weeks after the first dose and 16 weeks after the second dose.  Zoster vaccine. One dose is recommended for adults aged 30 years or older unless certain conditions are present.  Measles, mumps, and rubella (MMR) vaccine. Adults born  before 1957 generally are considered immune to measles and mumps. Adults born in 1957 or later should have 1 or more doses of MMR vaccine unless there is a contraindication to the vaccine or there is laboratory evidence of immunity to each of the three diseases. A routine second dose of MMR vaccine should be obtained at least 28 days after the first dose for students attending postsecondary schools, health care workers, or international travelers. People who received inactivated measles vaccine or an unknown type of measles vaccine during 1963-1967 should receive 2 doses of MMR vaccine. People who received inactivated mumps vaccine or an unknown type of mumps vaccine before 1979 and are at high risk for mumps infection should consider immunization with 2 doses of MMR vaccine. For females of childbearing age, rubella immunity should be determined. If there is no evidence of immunity, females who are not pregnant should be vaccinated. If there is no evidence of immunity, females who are pregnant should delay immunization until after pregnancy. Unvaccinated health care workers born before 1957 who lack laboratory evidence of measles, mumps, or rubella immunity or laboratory confirmation of disease should consider measles and mumps immunization with 2 doses of MMR vaccine or rubella immunization with 1 dose of MMR vaccine.  Pneumococcal 13-valent conjugate (PCV13) vaccine. When indicated, a person who is uncertain of his immunization history and has no record of immunization should receive the PCV13 vaccine. All adults 65 years of age and older should receive this  vaccine. An adult aged 19 years or older who has certain medical conditions and has not been previously immunized should receive 1 dose of PCV13 vaccine. This PCV13 should be followed with a dose of pneumococcal polysaccharide (PPSV23) vaccine. Adults who are at high risk for pneumococcal disease should obtain the PPSV23 vaccine at least 8 weeks after the dose of PCV13 vaccine. Adults older than 48 years of age who have normal immune system function should obtain the PPSV23 vaccine dose at least 1 year after the dose of PCV13 vaccine.  Pneumococcal polysaccharide (PPSV23) vaccine. When PCV13 is also indicated, PCV13 should be obtained first. All adults aged 65 years and older should be immunized. An adult younger than age 65 years who has certain medical conditions should be immunized. Any person who resides in a nursing home or long-term care facility should be immunized. An adult smoker should be immunized. People with an immunocompromised condition and certain other conditions should receive both PCV13 and PPSV23 vaccines. People with human immunodeficiency virus (HIV) infection should be immunized as soon as possible after diagnosis. Immunization during chemotherapy or radiation therapy should be avoided. Routine use of PPSV23 vaccine is not recommended for American Indians, [place] Natives, or people younger than 65 years unless there are medical conditions that require PPSV23 vaccine. When indicated, people who have unknown immunization and have no record of immunization should receive PPSV23 vaccine. One-time revaccination 5 years after the first dose of PPSV23 is recommended for people aged 19-64 years who have chronic kidney failure, nephrotic syndrome, asplenia, or immunocompromised conditions. People who received 1-2 doses of PPSV23 before age 65 years should receive another dose of PPSV23 vaccine at age 65 years or later if at least 5 years have passed since the previous dose. Doses of PPSV23 are not  needed for people immunized with PPSV23 at or after age 65 years.  Meningococcal vaccine. Adults with asplenia or persistent complement component deficiencies should receive 2 doses of quadrivalent meningococcal conjugate (MenACWY-D) vaccine. The doses should be obtained   at least 2 months apart. Microbiologists working with certain meningococcal bacteria, [place] recruits, people at risk during an outbreak, and people who travel to or live in countries with a high rate of meningitis should be immunized. A first-year college student up through age 34 years who is living in a residence hall should receive a dose if she did not receive a dose on or after her 16th birthday. Adults who have certain high-risk conditions should receive one or more doses of vaccine.  Hepatitis A vaccine. Adults who wish to be protected from this disease, have certain high-risk conditions, work with hepatitis A-infected animals, work in hepatitis A research labs, or travel to or work in countries with a high rate of hepatitis A should be immunized. Adults who were previously unvaccinated and who anticipate close contact with an international adoptee during the first 60 days after arrival in the [place] from a country with a high rate of hepatitis A should be immunized.  Hepatitis B vaccine. Adults who wish to be protected from this disease, have certain high-risk conditions, may be exposed to blood or other infectious body fluids, are household contacts or sex partners of hepatitis B positive people, are clients or workers in certain care facilities, or travel to or work in countries with a high rate of hepatitis B should be immunized.  Haemophilus influenzae type b (Hib) vaccine. A previously unvaccinated person with asplenia or sickle cell disease or having a scheduled splenectomy should receive 1 dose of Hib vaccine. Regardless of previous immunization, a recipient of a hematopoietic stem cell transplant should receive a  3-dose series 6-12 months after her successful transplant. Hib vaccine is not recommended for adults with HIV infection. Preventive Services / Frequency Ages 35 to 4 years  Blood pressure check.** / Every 3-5 years.  Lipid and cholesterol check.** / Every 5 years beginning at age 60.  Clinical breast exam.** / Every 3 years for women in their 71s and 10s.  BRCA-related cancer risk assessment.** / For women who have family members with a BRCA-related cancer (breast, ovarian, tubal, or peritoneal cancers).  Pap test.** / Every 2 years from ages 76 through 26. Every 3 years starting at age 61 through age 76 or 93 with a history of 3 consecutive normal Pap tests.  HPV screening.** / Every 3 years from ages 37 through ages 60 to 51 with a history of 3 consecutive normal Pap tests.  Hepatitis C blood test.** / For any individual with known risks for hepatitis C.  Skin self-exam. / Monthly.  Influenza vaccine. / Every year.  Tetanus, diphtheria, and acellular pertussis (Tdap, Td) vaccine.** / Consult your health care provider. Pregnant women should receive 1 dose of Tdap vaccine during each pregnancy. 1 dose of Td every 10 years.  Varicella vaccine.** / Consult your health care provider. Pregnant females who do not have evidence of immunity should receive the first dose after pregnancy.  HPV vaccine. / 3 doses over 6 months, if 93 and younger. The vaccine is not recommended for use in pregnant females. However, pregnancy testing is not needed before receiving a dose.  Measles, mumps, rubella (MMR) vaccine.** / You need at least 1 dose of MMR if you were born in 1957 or later. You may also need a 2nd dose. For females of childbearing age, rubella immunity should be determined. If there is no evidence of immunity, females who are not pregnant should be vaccinated. If there is no evidence of immunity, females who are  pregnant should delay immunization until after pregnancy.  Pneumococcal  13-valent conjugate (PCV13) vaccine.** / Consult your health care provider.  Pneumococcal polysaccharide (PPSV23) vaccine.** / 1 to 2 doses if you smoke cigarettes or if you have certain conditions.  Meningococcal vaccine.** / 1 dose if you are age 68 to 8 years and a Market researcher living in a residence hall, or have one of several medical conditions, you need to get vaccinated against meningococcal disease. You may also need additional booster doses.  Hepatitis A vaccine.** / Consult your health care provider.  Hepatitis B vaccine.** / Consult your health care provider.  Haemophilus influenzae type b (Hib) vaccine.** / Consult your health care provider. Ages 7 to 53 years  Blood pressure check.** / Every year.  Lipid and cholesterol check.** / Every 5 years beginning at age 25 years.  Lung cancer screening. / Every year if you are aged 11-80 years and have a 30-pack-year history of smoking and currently smoke or have quit within the past 15 years. Yearly screening is stopped once you have quit smoking for at least 15 years or develop a health problem that would prevent you from having lung cancer treatment.  Clinical breast exam.** / Every year after age 48 years.  BRCA-related cancer risk assessment.** / For women who have family members with a BRCA-related cancer (breast, ovarian, tubal, or peritoneal cancers).  Mammogram.** / Every year beginning at age 41 years and continuing for as long as you are in good health. Consult with your health care provider.  Pap test.** / Every 3 years starting at age 65 years through age 37 or 70 years with a history of 3 consecutive normal Pap tests.  HPV screening.** / Every 3 years from ages 72 years through ages 60 to 40 years with a history of 3 consecutive normal Pap tests.  Fecal occult blood test (FOBT) of stool. / Every year beginning at age 21 years and continuing until age 5 years. You may not need to do this test if you get  a colonoscopy every 10 years.  Flexible sigmoidoscopy or colonoscopy.** / Every 5 years for a flexible sigmoidoscopy or every 10 years for a colonoscopy beginning at age 35 years and continuing until age 48 years.  Hepatitis C blood test.** / For all people born from [birth year] through 1965 and any individual with known risks for hepatitis C.  Skin self-exam. / Monthly.  Influenza vaccine. / Every year.  Tetanus, diphtheria, and acellular pertussis (Tdap/Td) vaccine.** / Consult your health care provider. Pregnant women should receive 1 dose of Tdap vaccine during each pregnancy. 1 dose of Td every 10 years.  Varicella vaccine.** / Consult your health care provider. Pregnant females who do not have evidence of immunity should receive the first dose after pregnancy.  Zoster vaccine.** / 1 dose for adults aged 30 years or older.  Measles, mumps, rubella (MMR) vaccine.** / You need at least 1 dose of MMR if you were born in 1957 or later. You may also need a second dose. For females of childbearing age, rubella immunity should be determined. If there is no evidence of immunity, females who are not pregnant should be vaccinated. If there is no evidence of immunity, females who are pregnant should delay immunization until after pregnancy.  Pneumococcal 13-valent conjugate (PCV13) vaccine.** / Consult your health care provider.  Pneumococcal polysaccharide (PPSV23) vaccine.** / 1 to 2 doses if you smoke cigarettes or if you have certain conditions.  Meningococcal vaccine.** /  Consult your health care provider.  Hepatitis A vaccine.** / Consult your health care provider.  Hepatitis B vaccine.** / Consult your health care provider.  Haemophilus influenzae type b (Hib) vaccine.** / Consult your health care provider. Ages 64 years and over  Blood pressure check.** / Every year.  Lipid and cholesterol check.** / Every 5 years beginning at age 23 years.  Lung cancer screening. / Every year if you  are aged 16-80 years and have a 30-pack-year history of smoking and currently smoke or have quit within the past 15 years. Yearly screening is stopped once you have quit smoking for at least 15 years or develop a health problem that would prevent you from having lung cancer treatment.  Clinical breast exam.** / Every year after age 74 years.  BRCA-related cancer risk assessment.** / For women who have family members with a BRCA-related cancer (breast, ovarian, tubal, or peritoneal cancers).  Mammogram.** / Every year beginning at age 44 years and continuing for as long as you are in good health. Consult with your health care provider.  Pap test.** / Every 3 years starting at age 58 years through age 22 or 39 years with 3 consecutive normal Pap tests. Testing can be stopped between 65 and 70 years with 3 consecutive normal Pap tests and no abnormal Pap or HPV tests in the past 10 years.  HPV screening.** / Every 3 years from ages 64 years through ages 70 or 61 years with a history of 3 consecutive normal Pap tests. Testing can be stopped between 65 and 70 years with 3 consecutive normal Pap tests and no abnormal Pap or HPV tests in the past 10 years.  Fecal occult blood test (FOBT) of stool. / Every year beginning at age 40 years and continuing until age 27 years. You may not need to do this test if you get a colonoscopy every 10 years.  Flexible sigmoidoscopy or colonoscopy.** / Every 5 years for a flexible sigmoidoscopy or every 10 years for a colonoscopy beginning at age 7 years and continuing until age 32 years.  Hepatitis C blood test.** / For all people born from [birth year] through 1965 and any individual with known risks for hepatitis C.  Osteoporosis screening.** / A one-time screening for women ages 30 years and over and women at risk for fractures or osteoporosis.  Skin self-exam. / Monthly.  Influenza vaccine. / Every year.  Tetanus, diphtheria, and acellular pertussis (Tdap/Td)  vaccine.** / 1 dose of Td every 10 years.  Varicella vaccine.** / Consult your health care provider.  Zoster vaccine.** / 1 dose for adults aged 35 years or older.  Pneumococcal 13-valent conjugate (PCV13) vaccine.** / Consult your health care provider.  Pneumococcal polysaccharide (PPSV23) vaccine.** / 1 dose for all adults aged 46 years and older.  Meningococcal vaccine.** / Consult your health care provider.  Hepatitis A vaccine.** / Consult your health care provider.  Hepatitis B vaccine.** / Consult your health care provider.  Haemophilus influenzae type b (Hib) vaccine.** / Consult your health care provider. ** Family history and personal history of risk and conditions may change your health care provider's recommendations.   This information is not intended to replace advice given to you by your health care provider. Make sure you discuss any questions you have with your health care provider.   Document Released: 09/12/2001 Document Revised: 08/07/2014 Document Reviewed: 12/12/2010 Elsevier Interactive Patient Education [employer].

## 2016-03-27 NOTE — Progress Notes (Addendum)
Subjective:     Katelyn Bailey is a 50 y.o. female and is here for a comprehensive physical exam. The patient reports no problems.  Social History   Social History  . Marital status: Married    Spouse name: william  . Number of children: 2  . Years of education: N/A   Occupational History  . plant sanitation     [employer]   Social History Main Topics  . Smoking status: Never Smoker  . Smokeless tobacco: Never Used  . Alcohol use No  . Drug use: No  . Sexual activity: Not on file   Other Topics Concern  . Not on file   Social History Narrative   Married x 18 years   2 sons--one son died suddenly in 11/14/06 with no known cause   Health Maintenance  Topic Date Due  . HIV Screening  02/04/1983  . MAMMOGRAM  02/03/1986  . PAP SMEAR  12/25/2016  . TETANUS/TDAP  01/04/2022  . INFLUENZA VACCINE  Completed    The following portions of the patient's history were reviewed and updated as appropriate:  She  has a past medical history of Acid reflux disease; Complication of anesthesia; Dizziness; Essential hypertension; Headache(784.0); Hiatal hernia; Hypercholesterolemia; Kidney stone; Neuralgia; Occipital neuralgia; and Overweight(278.02). She  does not have any pertinent problems on file. 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 Her family history includes Cancer in her mother; Heart disease in her father and paternal grandfather. She  reports that she has never smoked. She has never used smokeless tobacco. She reports that she does not drink alcohol or use drugs. She has a current medication list which includes the following prescription(s): alprazolam, calcium citrate-vitamin d, lisinopril, and multivitamin with minerals. Current Outpatient Prescriptions on File Prior to Visit  Medication Sig Dispense Refill  .  ALPRAZolam (XANAX) 0.5 MG tablet Take 1/2 to 1 tablet by mouth three times daily as needed 90 tablet 1  . CALCIUM CITRATE-VITAMIN D PO Take 1 tablet by mouth 3 (three) times daily.    Marland Kitchen lisinopril (PRINIVIL,ZESTRIL) 10 MG tablet TAKE 1 TABLET DAILY 90 tablet 1  . Multiple Vitamin (MULTIVITAMIN WITH MINERALS) TABS Take 1 tablet by mouth 2 (two) times daily.    . [DISCONTINUED] topiramate (TOPAMAX) 25 MG capsule Take one tablet by mouth at bedtime x 1 week then 1 po bid thereafter. 60 capsule 5   No current facility-administered medications on file prior to visit.    She has No Known Allergies..  Review of Systems Review of Systems  Constitutional: Negative for activity change, appetite change and fatigue.  HENT: Negative for hearing loss, congestion, tinnitus and ear discharge.  dentist q72m Eyes: Negative for visual disturbance (see optho q1y -- vision corrected to 20/20 with glasses).  Respiratory: Negative for cough, chest tightness and shortness of breath.   Cardiovascular: Negative for chest pain, palpitations and leg swelling.  Gastrointestinal: Negative for abdominal pain, diarrhea, constipation and abdominal distention.  Genitourinary: Negative for urgency, frequency, decreased urine volume and difficulty urinating.  Musculoskeletal: Negative for back pain, arthralgias and gait problem.  Skin: Negative for color change, pallor and rash.  Neurological: Negative for dizziness, light-headedness, numbness and headaches.  Hematological: Negative for adenopathy. Does not bruise/bleed easily.  Psychiatric/Behavioral: Negative for suicidal ideas, confusion, sleep disturbance, self-injury, dysphoric mood, decreased concentration and agitation.       Objective:  BP 108/64 (BP Location: Left Arm, Patient Position: Sitting, Cuff Size: Normal)   Pulse 63   Temp 98 F (36.7 C) (Oral)   Ht 5' 1.75" (1.568 m)   Wt 148 lb 3.2 oz (67.2 kg)   LMP 11/08/2010   SpO2 98%   BMI 27.33 kg/m   General appearance: alert, cooperative, appears stated age and no distress Head: Normocephalic, without obvious abnormality, atraumatic Eyes: conjunctivae/corneas clear. PERRL, EOM's intact. Fundi benign. Ears: normal TM's and external ear canals both ears Nose: Nares normal. Septum midline. Mucosa normal. No drainage or sinus tenderness. Throat: lips, mucosa, and tongue normal; teeth and gums normal Neck: no adenopathy, no carotid bruit, no JVD, supple, symmetrical, trachea midline and thyroid not enlarged, symmetric, no tenderness/mass/nodules Back: symmetric, no curvature. ROM normal. No CVA tenderness. Lungs: clear to auscultation bilaterally Breasts: normal appearance, no masses or tenderness Heart: regular rate and rhythm, S1, S2 normal, no murmur, click, rub or gallop Abdomen: soft, non-tender; bowel sounds normal; no masses,  no organomegaly Pelvic: deferred and not indicated; status post hysterectomy, negative ROS Extremities: extremities normal, atraumatic, no cyanosis or edema Pulses: 2+ and symmetric Skin: Skin color, texture, turgor normal. No rashes or lesions Lymph nodes: Cervical, supraclavicular, and axillary nodes normal. Neurologic: Alert and oriented X 3, normal strength and tone. Normal symmetric reflexes. Normal coordination and gait    Assessment:    Healthy female exam.    Plan:     ghm utd Check labs  See After Visit Summary for Counseling Recommendations    1. Encounter for screening mammogram for breast cancer \  2. Need for prophylactic vaccination and inoculation against influenza \ - Flu Vaccine QUAD 36+ mos PF IM (Fluarix & Fluzone Quad PF)  3. Generalized anxiety disorder stable - ALPRAZolam (XANAX) 0.5 MG tablet; Take 1/2 to 1 tablet by mouth three times daily as needed  Dispense: 90 tablet; Refill: 1  4. Essential hypertension stable - lisinopril (PRINIVIL,ZESTRIL) 10 MG tablet; Take 1 tablet (10 mg total) by mouth daily.  Dispense: 90  tablet; Refill: 1 - Comprehensive metabolic panel - Lipid panel - CBC with Differential/Platelet - POCT urinalysis dipstick - TSH  5. Screening for breast cancer   - MM DIGITAL SCREENING BILATERAL; Future  6. Preventative health care See above - Comprehensive metabolic panel - Lipid panel - CBC with Differential/Platelet - POCT urinalysis dipstick - TSH

## 2016-04-11 ENCOUNTER — Ambulatory Visit (HOSPITAL_BASED_OUTPATIENT_CLINIC_OR_DEPARTMENT_OTHER)
Admission: RE | Admit: 2016-04-11 | Discharge: 2016-04-11 | Disposition: A | Discharge status: Home / Self Care | Payer: BLUE CROSS/BLUE SHIELD | Source: Ambulatory Visit | Attending: Family Medicine | Admitting: Family Medicine

## 2016-04-11 DIAGNOSIS — Z1239 Encounter for other screening for malignant neoplasm of breast: Secondary | ICD-10-CM

## 2016-04-11 DIAGNOSIS — Z1231 Encounter for screening mammogram for malignant neoplasm of breast: Secondary | ICD-10-CM | POA: Insufficient documentation

## 2016-06-19 ENCOUNTER — Encounter (HOSPITAL_COMMUNITY): Payer: Self-pay

## 2016-08-17 ENCOUNTER — Encounter: Payer: Self-pay | Admitting: Family Medicine

## 2016-08-22 ENCOUNTER — Telehealth: Payer: Self-pay | Admitting: *Deleted

## 2016-08-22 NOTE — Telephone Encounter (Signed)
Received completed and signed Wellness Plan form from Dr. Etter Sjogren.  Original mailed to the pt by request, and a copy made to be scanned into the chart.  Sent pt a MyChart message let her know the form was completed and was been mailed to her.//AB/CMA

## 2016-09-27 ENCOUNTER — Other Ambulatory Visit: Payer: Self-pay | Admitting: Family Medicine

## 2016-09-27 DIAGNOSIS — I10 Essential (primary) hypertension: Secondary | ICD-10-CM

## 2017-03-11 ENCOUNTER — Other Ambulatory Visit: Payer: Self-pay | Admitting: Family Medicine

## 2017-03-29 ENCOUNTER — Encounter: Payer: Self-pay | Admitting: Family Medicine

## 2017-03-29 ENCOUNTER — Other Ambulatory Visit: Payer: Self-pay | Admitting: Family Medicine

## 2017-03-29 ENCOUNTER — Ambulatory Visit (INDEPENDENT_AMBULATORY_CARE_PROVIDER_SITE_OTHER): Payer: BLUE CROSS/BLUE SHIELD | Admitting: Family Medicine

## 2017-03-29 VITALS — BP 110/72 | HR 59 | Ht 61.75 in | Wt 149.8 lb

## 2017-03-29 DIAGNOSIS — Z23 Encounter for immunization: Secondary | ICD-10-CM

## 2017-03-29 DIAGNOSIS — F411 Generalized anxiety disorder: Secondary | ICD-10-CM

## 2017-03-29 DIAGNOSIS — R6882 Decreased libido: Secondary | ICD-10-CM

## 2017-03-29 DIAGNOSIS — Z1231 Encounter for screening mammogram for malignant neoplasm of breast: Secondary | ICD-10-CM

## 2017-03-29 DIAGNOSIS — D509 Iron deficiency anemia, unspecified: Secondary | ICD-10-CM | POA: Diagnosis not present

## 2017-03-29 DIAGNOSIS — I10 Essential (primary) hypertension: Secondary | ICD-10-CM

## 2017-03-29 DIAGNOSIS — Z0000 Encounter for general adult medical examination without abnormal findings: Secondary | ICD-10-CM | POA: Diagnosis not present

## 2017-03-29 LAB — CBC WITH DIFFERENTIAL/PLATELET
Basophils Absolute: 0.1 10*3/uL (ref 0.0–0.1)
Basophils Relative: 1.9 % (ref 0.0–3.0)
Eosinophils Absolute: 0.1 10*3/uL (ref 0.0–0.7)
Eosinophils Relative: 1.1 % (ref 0.0–5.0)
HCT: 33.9 % — ABNORMAL LOW (ref 36.0–46.0)
Hemoglobin: 10.6 g/dL — ABNORMAL LOW (ref 12.0–15.0)
Lymphocytes Relative: 36.1 % (ref 12.0–46.0)
Lymphs Abs: 1.8 10*3/uL (ref 0.7–4.0)
MCHC: 31.2 g/dL (ref 30.0–36.0)
MCV: 81.4 fl (ref 78.0–100.0)
Monocytes Absolute: 0.4 10*3/uL (ref 0.1–1.0)
Monocytes Relative: 8.2 % (ref 3.0–12.0)
Neutro Abs: 2.6 10*3/uL (ref 1.4–7.7)
Neutrophils Relative %: 52.7 % (ref 43.0–77.0)
Platelets: 230 10*3/uL (ref 150.0–400.0)
RBC: 4.17 Mil/uL (ref 3.87–5.11)
RDW: 16.5 % — ABNORMAL HIGH (ref 11.5–15.5)
WBC: 4.9 10*3/uL (ref 4.0–10.5)

## 2017-03-29 LAB — COMPREHENSIVE METABOLIC PANEL
ALT: 12 U/L (ref 0–35)
AST: 17 U/L (ref 0–37)
Albumin: 4.1 g/dL (ref 3.5–5.2)
Alkaline Phosphatase: 66 U/L (ref 39–117)
BUN: 10 mg/dL (ref 6–23)
CO2: 28 mEq/L (ref 19–32)
Calcium: 9.4 mg/dL (ref 8.4–10.5)
Chloride: 106 mEq/L (ref 96–112)
Creatinine, Ser: 0.69 mg/dL (ref 0.40–1.20)
GFR: 96.05 mL/min (ref >60.00)
Glucose, Bld: 89 mg/dL (ref 70–99)
Potassium: 4.2 mEq/L (ref 3.5–5.1)
Sodium: 141 mEq/L (ref 135–145)
Total Bilirubin: 0.4 mg/dL (ref 0.2–1.2)
Total Protein: 6.8 g/dL (ref 6.0–8.3)

## 2017-03-29 LAB — TSH: TSH: 1.09 u[IU]/mL (ref 0.35–4.50)

## 2017-03-29 LAB — FERRITIN: Ferritin: 2.9 ng/mL — ABNORMAL LOW (ref 10.0–291.0)

## 2017-03-29 LAB — IBC PANEL
Iron: 21 ug/dL — ABNORMAL LOW (ref 42–145)
Saturation Ratios: 4.1 % — ABNORMAL LOW (ref 20.0–50.0)
Transferrin: 365 mg/dL — ABNORMAL HIGH (ref 212.0–360.0)

## 2017-03-29 LAB — LIPID PANEL
Cholesterol: 200 mg/dL (ref 0–200)
HDL: 67 mg/dL (ref >39.00)
LDL Cholesterol: 117 mg/dL — ABNORMAL HIGH (ref 0–99)
NonHDL: 132.68
Total CHOL/HDL Ratio: 3
Triglycerides: 77 mg/dL (ref 0.0–149.0)
VLDL: 15.4 mg/dL (ref 0.0–40.0)

## 2017-03-29 MED ORDER — LISINOPRIL 10 MG PO TABS: 10.0000 mg | ORAL_TABLET | Freq: Every day | ORAL | 3 refills | Status: DC

## 2017-03-29 MED ORDER — ALPRAZOLAM 0.5 MG PO TABS: ORAL_TABLET | ORAL | 1 refills | Status: DC

## 2017-03-29 MED ORDER — BUPROPION HCL ER (XL) 150 MG PO TB24: ORAL_TABLET | ORAL | 0 refills | Status: DC

## 2017-03-29 NOTE — Patient Instructions (Signed)
Preventive Care 40-64 Years, Female Preventive care refers to lifestyle choices and visits with your health care provider that can promote health and wellness. What does preventive care include?  A yearly physical exam. This is also called an annual well check.  Dental exams once or twice a year.  Routine eye exams. Ask your health care provider how often you should have your eyes checked.  Personal lifestyle choices, including: ? Daily care of your teeth and gums. ? Regular physical activity. ? Eating a healthy diet. ? Avoiding tobacco and drug use. ? Limiting alcohol use. ? Practicing safe sex. ? Taking low-dose aspirin daily starting at age 58. ? Taking vitamin and mineral supplements as recommended by your health care provider. What happens during an annual well check? The services and screenings done by your health care provider during your annual well check will depend on your age, overall health, lifestyle risk factors, and family history of disease. Counseling Your health care provider may ask you questions about your:  Alcohol use.  Tobacco use.  Drug use.  Emotional well-being.  Home and relationship well-being.  Sexual activity.  Eating habits.  Work and work Statistician.  Method of birth control.  Menstrual cycle.  Pregnancy history.  Screening You may have the following tests or measurements:  Height, weight, and BMI.  Blood pressure.  Lipid and cholesterol levels. These may be checked every 5 years, or more frequently if you are over 81 years old.  Skin check.  Lung cancer screening. You may have this screening every year starting at age 78 if you have a 30-pack-year history of smoking and currently smoke or have quit within the past 15 years.  Fecal occult blood test (FOBT) of the stool. You may have this test every year starting at age 65.  Flexible sigmoidoscopy or colonoscopy. You may have a sigmoidoscopy every 5 years or a colonoscopy  every 10 years starting at age 30.  Hepatitis C blood test.  Hepatitis B blood test.  Sexually transmitted disease (STD) testing.  Diabetes screening. This is done by checking your blood sugar (glucose) after you have not eaten for a while (fasting). You may have this done every 1-3 years.  Mammogram. This may be done every 1-2 years. Talk to your health care provider about when you should start having regular mammograms. This may depend on whether you have a family history of breast cancer.  BRCA-related cancer screening. This may be done if you have a family history of breast, ovarian, tubal, or peritoneal cancers.  Pelvic exam and Pap test. This may be done every 3 years starting at age 80. Starting at age 36, this may be done every 5 years if you have a Pap test in combination with an HPV test.  Bone density scan. This is done to screen for osteoporosis. You may have this scan if you are at high risk for osteoporosis.  Discuss your test results, treatment options, and if necessary, the need for more tests with your health care provider. Vaccines Your health care provider may recommend certain vaccines, such as:  Influenza vaccine. This is recommended every year.  Tetanus, diphtheria, and acellular pertussis (Tdap, Td) vaccine. You may need a Td booster every 10 years.  Varicella vaccine. You may need this if you have not been vaccinated.  Zoster vaccine. You may need this after age 5.  Measles, mumps, and rubella (MMR) vaccine. You may need at least one dose of MMR if you were born in  1955 or later. You may also need a second dose.  Pneumococcal 13-valent conjugate (PCV13) vaccine. You may need this if you have certain conditions and were not previously vaccinated.  Pneumococcal polysaccharide (PPSV23) vaccine. You may need one or two doses if you smoke cigarettes or if you have certain conditions.  Meningococcal vaccine. You may need this if you have certain  conditions.  Hepatitis A vaccine. You may need this if you have certain conditions or if you travel or work in places where you may be exposed to hepatitis A.  Hepatitis B vaccine. You may need this if you have certain conditions or if you travel or work in places where you may be exposed to hepatitis B.  Haemophilus influenzae type b (Hib) vaccine. You may need this if you have certain conditions.  Talk to your health care provider about which screenings and vaccines you need and how often you need them. This information is not intended to replace advice given to you by your health care provider. Make sure you discuss any questions you have with your health care provider. Document Released: 08/13/2015 Document Revised: 04/05/2016 Document Reviewed: 05/18/2015 Elsevier Interactive Patient Education  2017 [employer].

## 2017-03-29 NOTE — Progress Notes (Signed)
Subjective:     Katelyn Bailey is a 51 y.o. female and is here for a comprehensive physical exam. The patient reports no problems.  Social History   Social History  . Marital status: Married    Spouse name: william  . Number of children: 2  . Years of education: N/A   Occupational History  . plant sanitation     [employer]   Social History Main Topics  . Smoking status: Never Smoker  . Smokeless tobacco: Never Used  . Alcohol use No  . Drug use: No  . Sexual activity: Yes    Partners: Male   Other Topics Concern  . Not on file   Social History Narrative   Married x 18 years   2 sons--one son died suddenly in 2006-11-03 with no known cause   Health Maintenance  Topic Date Due  . HIV Screening  02/04/1983  . PAP SMEAR  12/25/2016  . INFLUENZA VACCINE  02/28/2017  . MAMMOGRAM  04/11/2017  . TETANUS/TDAP  01/04/2022    The following portions of the patient's history were reviewed and updated as appropriate:  She  has a past medical history of Acid reflux disease; Complication of anesthesia; Dizziness; Essential hypertension; Headache(784.0); Hiatal hernia; Hypercholesterolemia; Kidney stone; Neuralgia; Occipital neuralgia; and Overweight(278.02). She  does not have any pertinent problems on file. 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 Her family history includes Cancer in her mother; Heart disease in her father and paternal grandfather. She  reports that she has never smoked. She has never used smokeless tobacco. She reports that she does not drink alcohol or use drugs. She has a current medication list which includes the following prescription(s): alprazolam, calcium citrate-vitamin d, lisinopril, and multivitamin with minerals. Current Outpatient Prescriptions on File Prior to Visit  Medication Sig Dispense Refill   . CALCIUM CITRATE-VITAMIN D PO Take 1 tablet by mouth 3 (three) times daily.    . Multiple Vitamin (MULTIVITAMIN WITH MINERALS) TABS Take 1 tablet by mouth 2 (two) times daily.    . [DISCONTINUED] topiramate (TOPAMAX) 25 MG capsule Take one tablet by mouth at bedtime x 1 week then 1 po bid thereafter. 60 capsule 5   No current facility-administered medications on file prior to visit.    She has No Known Allergies..  Review of Systems Review of Systems  Constitutional: Negative for activity change, appetite change and fatigue.  HENT: Negative for hearing loss, congestion, tinnitus and ear discharge.  dentist q104m Eyes: Negative for visual disturbance (see optho q1y -- vision corrected to 20/20 with glasses).  Respiratory: Negative for cough, chest tightness and shortness of breath.   Cardiovascular: Negative for chest pain, palpitations and leg swelling.  Gastrointestinal: Negative for abdominal pain, diarrhea, constipation and abdominal distention.  Genitourinary: Negative for urgency, frequency, decreased urine volume and difficulty urinating.  Musculoskeletal: Negative for back pain, arthralgias and gait problem.  Skin: Negative for color change, pallor and rash.  Neurological: Negative for dizziness, light-headedness, numbness and headaches.  Hematological: Negative for adenopathy. Does not bruise/bleed easily.  Psychiatric/Behavioral: Negative for suicidal ideas, confusion, sleep disturbance, self-injury, dysphoric mood, decreased concentration and agitation.       Objective:    BP 110/72 (BP Location: Right Arm, Patient Position: Sitting, Cuff Size: Normal)   Pulse (!) 59   Ht 5' 1.75" (1.568 m)   Wt 149 lb 12.8  oz (67.9 kg)   LMP 11/08/2010   SpO2 97%   BMI 27.62 kg/m  General appearance: alert, cooperative, appears stated age and no distress Head: Normocephalic, without obvious abnormality, atraumatic Eyes: conjunctivae/corneas clear. PERRL, EOM's intact. Fundi  benign. Ears: normal TM's and external ear canals both ears Nose: Nares normal. Septum midline. Mucosa normal. No drainage or sinus tenderness. Throat: lips, mucosa, and tongue normal; teeth and gums normal Neck: no adenopathy, no carotid bruit, no JVD, supple, symmetrical, trachea midline and thyroid not enlarged, symmetric, no tenderness/mass/nodules Back: symmetric, no curvature. ROM normal. No CVA tenderness. Lungs: clear to auscultation bilaterally Breasts: normal appearance, no masses or tenderness Heart: regular rate and rhythm, S1, S2 normal, no murmur, click, rub or gallop Abdomen: soft, non-tender; bowel sounds normal; no masses,  no organomegaly Pelvic: not indicated; status post hysterectomy, negative ROS Extremities: extremities normal, atraumatic, no cyanosis or edema Pulses: 2+ and symmetric Skin: Skin color, texture, turgor normal. No rashes or lesions Lymph nodes: Cervical, supraclavicular, and axillary nodes normal. Neurologic: Alert and oriented X 3, normal strength and tone. Normal symmetric reflexes. Normal coordination and gait    Assessment:    Healthy female exam.      Plan:    ghm utd Check labs See After Visit Summary for Counseling Recommendations    1. Essential hypertension Well controlled, no changes to meds. Encouraged heart healthy diet such as the DASH diet and exercise as tolerated.  - CBC with Differential/Platelet - Comprehensive metabolic panel - POCT Urinalysis Dipstick (Automated) - lisinopril (PRINIVIL,ZESTRIL) 10 MG tablet; Take 1 tablet (10 mg total) by mouth daily.  Dispense: 90 tablet; Refill: 3  2. Generalized anxiety disorder  - ALPRAZolam (XANAX) 0.5 MG tablet; Take 1/2 to 1 tablet by mouth three times daily as needed  Dispense: 90 tablet; Refill: 1  3. Preventative health care ghm utd Check labs - TSH - Lipid panel - CBC with Differential/Platelet - Comprehensive metabolic panel - POCT Urinalysis Dipstick (Automated)  4.  Iron deficiency anemia, unspecified iron deficiency anemia type Check labs - IBC panel - Ferritin - Fecal occult blood, imunochemical  5. Decreased libido   - buPROPion (WELLBUTRIN XL) 150 MG 24 hr tablet; 1 po q am  Dispense: 60 tablet; Refill: 0

## 2017-04-03 ENCOUNTER — Other Ambulatory Visit: Payer: Self-pay | Admitting: Family Medicine

## 2017-04-03 DIAGNOSIS — E611 Iron deficiency: Secondary | ICD-10-CM

## 2017-04-12 ENCOUNTER — Other Ambulatory Visit (INDEPENDENT_AMBULATORY_CARE_PROVIDER_SITE_OTHER): Payer: BLUE CROSS/BLUE SHIELD

## 2017-04-12 DIAGNOSIS — E7800 Pure hypercholesterolemia, unspecified: Secondary | ICD-10-CM

## 2017-04-12 LAB — FECAL OCCULT BLOOD, IMMUNOCHEMICAL: Fecal Occult Bld: NEGATIVE

## 2017-04-13 ENCOUNTER — Encounter: Payer: Self-pay | Admitting: Family Medicine

## 2017-04-13 NOTE — Telephone Encounter (Signed)
1 a day for 1 week then 2 a day

## 2017-04-18 ENCOUNTER — Encounter: Payer: Self-pay | Admitting: Family Medicine

## 2017-04-19 ENCOUNTER — Ambulatory Visit (HOSPITAL_BASED_OUTPATIENT_CLINIC_OR_DEPARTMENT_OTHER)
Admission: RE | Admit: 2017-04-19 | Discharge: 2017-04-19 | Disposition: A | Discharge status: Home / Self Care | Payer: BLUE CROSS/BLUE SHIELD | Source: Ambulatory Visit | Attending: Family Medicine | Admitting: Family Medicine

## 2017-04-19 DIAGNOSIS — Z1231 Encounter for screening mammogram for malignant neoplasm of breast: Secondary | ICD-10-CM | POA: Diagnosis present

## 2017-04-25 ENCOUNTER — Telehealth: Payer: Self-pay | Admitting: Family Medicine

## 2017-04-25 NOTE — Telephone Encounter (Signed)
Caller name: Relation to [id number]:FMBW Call back [place]:  Reason for call: pt would like a call back, states she has questions regarding her medication. buPROPion (WELLBUTRIN XL) 150 MG 24 hr tablet, wants to confirm how she is to take the dosage.

## 2017-04-26 NOTE — Telephone Encounter (Signed)
She was to take 1 a day for 1 week then 2 a day--  Then 300 mg can be called into the pharmacy

## 2017-04-27 ENCOUNTER — Other Ambulatory Visit: Payer: Self-pay | Admitting: Emergency Medicine

## 2017-04-27 MED ORDER — BUPROPION HCL ER (XL) 300 MG PO TB24: 300.0000 mg | ORAL_TABLET | Freq: Every day | ORAL | 0 refills | Status: DC

## 2017-04-27 NOTE — Telephone Encounter (Signed)
Called pt and informed her that per provider she was to take 1 tablet a day for 1 week after that start 2 per day. Pt states that this is how she is taking it but will run out soon. Sent 300 mg tablets to mail order pharmacy per pt request.

## 2017-05-04 ENCOUNTER — Other Ambulatory Visit (INDEPENDENT_AMBULATORY_CARE_PROVIDER_SITE_OTHER): Payer: BLUE CROSS/BLUE SHIELD

## 2017-05-04 DIAGNOSIS — E611 Iron deficiency: Secondary | ICD-10-CM | POA: Diagnosis not present

## 2017-05-04 LAB — CBC WITH DIFFERENTIAL/PLATELET
Basophils Absolute: 0.1 10*3/uL (ref 0.0–0.1)
Basophils Relative: 1 % (ref 0.0–3.0)
Eosinophils Absolute: 0.1 10*3/uL (ref 0.0–0.7)
Eosinophils Relative: 1.6 % (ref 0.0–5.0)
HCT: 36.2 % (ref 36.0–46.0)
Hemoglobin: 11.2 g/dL — ABNORMAL LOW (ref 12.0–15.0)
Lymphocytes Relative: 29.6 % (ref 12.0–46.0)
Lymphs Abs: 1.5 10*3/uL (ref 0.7–4.0)
MCHC: 31 g/dL (ref 30.0–36.0)
MCV: 83.7 fl (ref 78.0–100.0)
Monocytes Absolute: 0.4 10*3/uL (ref 0.1–1.0)
Monocytes Relative: 8.2 % (ref 3.0–12.0)
Neutro Abs: 3.1 10*3/uL (ref 1.4–7.7)
Neutrophils Relative %: 59.6 % (ref 43.0–77.0)
Platelets: 209 10*3/uL (ref 150.0–400.0)
RBC: 4.33 Mil/uL (ref 3.87–5.11)
RDW: 18.5 % — ABNORMAL HIGH (ref 11.5–15.5)
WBC: 5.1 10*3/uL (ref 4.0–10.5)

## 2017-05-04 LAB — FERRITIN: Ferritin: 6.2 ng/mL — ABNORMAL LOW (ref 10.0–291.0)

## 2017-05-04 LAB — IBC PANEL
Iron: 33 ug/dL — ABNORMAL LOW (ref 42–145)
Saturation Ratios: 7 % — ABNORMAL LOW (ref 20.0–50.0)
Transferrin: 338 mg/dL (ref 212.0–360.0)

## 2017-05-19 ENCOUNTER — Other Ambulatory Visit: Payer: Self-pay | Admitting: Family Medicine

## 2017-05-19 DIAGNOSIS — R6882 Decreased libido: Secondary | ICD-10-CM

## 2017-07-29 ENCOUNTER — Other Ambulatory Visit: Payer: Self-pay | Admitting: Family Medicine

## 2017-08-08 ENCOUNTER — Encounter: Payer: Self-pay | Admitting: Family Medicine

## 2017-08-14 NOTE — Telephone Encounter (Signed)
[facility] Form received, patient did not sign or give waist circumference, marked on form; forwarded to provider/SLS 01/15

## 2017-08-27 DIAGNOSIS — M25569 Pain in unspecified knee: Secondary | ICD-10-CM | POA: Diagnosis not present

## 2017-09-29 ENCOUNTER — Other Ambulatory Visit: Payer: Self-pay | Admitting: Family Medicine

## 2017-10-17 ENCOUNTER — Other Ambulatory Visit: Payer: Self-pay | Admitting: Family Medicine

## 2017-10-17 ENCOUNTER — Encounter: Payer: Self-pay | Admitting: Family Medicine

## 2017-10-19 ENCOUNTER — Other Ambulatory Visit: Payer: Self-pay | Admitting: Family Medicine

## 2017-10-19 DIAGNOSIS — F411 Generalized anxiety disorder: Secondary | ICD-10-CM

## 2017-10-19 MED ORDER — ALPRAZOLAM 0.5 MG PO TABS: ORAL_TABLET | ORAL | 1 refills | Status: DC

## 2017-10-19 NOTE — Telephone Encounter (Signed)
Filled Can you check database and contract for me

## 2017-10-22 ENCOUNTER — Encounter: Payer: Self-pay | Admitting: Family Medicine

## 2018-01-11 ENCOUNTER — Other Ambulatory Visit: Payer: Self-pay | Admitting: Family Medicine

## 2018-03-27 ENCOUNTER — Other Ambulatory Visit: Payer: Self-pay

## 2018-03-27 DIAGNOSIS — I10 Essential (primary) hypertension: Secondary | ICD-10-CM

## 2018-03-27 MED ORDER — LISINOPRIL 10 MG PO TABS: 10.0000 mg | ORAL_TABLET | Freq: Every day | ORAL | 1 refills | Status: DC

## 2018-03-27 NOTE — Telephone Encounter (Signed)
Received refill request for lisinopril from [facility]. Pt. Due 9/4 for refill per records. Last OV 03/29/17; routed to Dr. Etter Sjogren to review.

## 2018-04-04 ENCOUNTER — Ambulatory Visit (INDEPENDENT_AMBULATORY_CARE_PROVIDER_SITE_OTHER): Payer: 59 | Admitting: Family Medicine

## 2018-04-04 ENCOUNTER — Encounter: Payer: Self-pay | Admitting: Family Medicine

## 2018-04-04 VITALS — BP 110/58 | HR 68 | Temp 98.1°F | Resp 16 | Ht 60.5 in | Wt 143.8 lb

## 2018-04-04 DIAGNOSIS — F411 Generalized anxiety disorder: Secondary | ICD-10-CM | POA: Diagnosis not present

## 2018-04-04 DIAGNOSIS — I10 Essential (primary) hypertension: Secondary | ICD-10-CM

## 2018-04-04 DIAGNOSIS — Z0000 Encounter for general adult medical examination without abnormal findings: Secondary | ICD-10-CM

## 2018-04-04 DIAGNOSIS — Z23 Encounter for immunization: Secondary | ICD-10-CM | POA: Diagnosis not present

## 2018-04-04 DIAGNOSIS — F418 Other specified anxiety disorders: Secondary | ICD-10-CM

## 2018-04-04 LAB — VITAMIN B12: Vitamin B-12: 226 pg/mL (ref 211–911)

## 2018-04-04 LAB — CBC WITH DIFFERENTIAL/PLATELET
Basophils Absolute: 0.1 10*3/uL (ref 0.0–0.1)
Basophils Relative: 1 % (ref 0.0–3.0)
Eosinophils Absolute: 0.1 10*3/uL (ref 0.0–0.7)
Eosinophils Relative: 1.4 % (ref 0.0–5.0)
HCT: 40.1 % (ref 36.0–46.0)
Hemoglobin: 13 g/dL (ref 12.0–15.0)
Lymphocytes Relative: 27.3 % (ref 12.0–46.0)
Lymphs Abs: 1.4 10*3/uL (ref 0.7–4.0)
MCHC: 32.4 g/dL (ref 30.0–36.0)
MCV: 90.6 fl (ref 78.0–100.0)
Monocytes Absolute: 0.5 10*3/uL (ref 0.1–1.0)
Monocytes Relative: 9.2 % (ref 3.0–12.0)
Neutro Abs: 3.2 10*3/uL (ref 1.4–7.7)
Neutrophils Relative %: 61.1 % (ref 43.0–77.0)
Platelets: 176 10*3/uL (ref 150.0–400.0)
RBC: 4.43 Mil/uL (ref 3.87–5.11)
RDW: 14 % (ref 11.5–15.5)
WBC: 5.3 10*3/uL (ref 4.0–10.5)

## 2018-04-04 LAB — COMPREHENSIVE METABOLIC PANEL
ALT: 14 U/L (ref 0–35)
AST: 15 U/L (ref 0–37)
Albumin: 4.1 g/dL (ref 3.5–5.2)
Alkaline Phosphatase: 64 U/L (ref 39–117)
BUN: 11 mg/dL (ref 6–23)
CO2: 31 mEq/L (ref 19–32)
Calcium: 8.8 mg/dL (ref 8.4–10.5)
Chloride: 104 mEq/L (ref 96–112)
Creatinine, Ser: 0.68 mg/dL (ref 0.40–1.20)
GFR: 97.28 mL/min (ref >60.00)
Glucose, Bld: 78 mg/dL (ref 70–99)
Potassium: 4.3 mEq/L (ref 3.5–5.1)
Sodium: 139 mEq/L (ref 135–145)
Total Bilirubin: 0.4 mg/dL (ref 0.2–1.2)
Total Protein: 6.2 g/dL (ref 6.0–8.3)

## 2018-04-04 LAB — LIPID PANEL
Cholesterol: 192 mg/dL (ref 0–200)
HDL: 63.3 mg/dL (ref >39.00)
LDL Cholesterol: 116 mg/dL — ABNORMAL HIGH (ref 0–99)
NonHDL: 128.68
Total CHOL/HDL Ratio: 3
Triglycerides: 63 mg/dL (ref 0.0–149.0)
VLDL: 12.6 mg/dL (ref 0.0–40.0)

## 2018-04-04 LAB — TSH: TSH: 0.94 u[IU]/mL (ref 0.35–4.50)

## 2018-04-04 LAB — VITAMIN D 25 HYDROXY (VIT D DEFICIENCY, FRACTURES): VITD: 36.3 ng/mL (ref 30.00–100.00)

## 2018-04-04 MED ORDER — BUPROPION HCL ER (XL) 300 MG PO TB24: 300.0000 mg | ORAL_TABLET | Freq: Every day | ORAL | 3 refills | Status: DC

## 2018-04-04 MED ORDER — LISINOPRIL 10 MG PO TABS: 10.0000 mg | ORAL_TABLET | Freq: Every day | ORAL | 1 refills | Status: DC

## 2018-04-04 MED ORDER — ALPRAZOLAM 0.5 MG PO TABS: ORAL_TABLET | ORAL | 1 refills | Status: DC

## 2018-04-04 NOTE — Progress Notes (Signed)
Subjective:     Katelyn Bailey is a 52 y.o. female and is here for a comprehensive physical exam. The patient reports no problems.   She does need refills on the meds She will see gyn about her decreased libido.  The wellbutrin did not help.    Social History   Socioeconomic History  . Marital status: Married    Spouse name: Gwyndolyn Saxon  . Number of children: 2  . Years of education: Not on file  . Highest education level: Not on file  Occupational History  . Occupation: plant sanitation    Comment: [employer]  Social Needs  . Financial resource strain: Not on file  . Food insecurity:    Worry: Not on file    Inability: Not on file  . Transportation needs:    Medical: Not on file    Non-medical: Not on file  Tobacco Use  . Smoking status: Never Smoker  . Smokeless tobacco: Never Used  Substance and Sexual Activity  . Alcohol use: No  . Drug use: No  . Sexual activity: Yes    Partners: Male  Lifestyle  . Physical activity:    Days per week: Not on file    Minutes per session: Not on file  . Stress: Not on file  Relationships  . Social connections:    Talks on phone: Not on file    Gets together: Not on file    Attends religious service: Not on file    Active member of club or organization: Not on file    Attends meetings of clubs or organizations: Not on file    Relationship status: Not on file  . Intimate partner violence:    Fear of current or ex partner: Not on file    Emotionally abused: Not on file    Physically abused: Not on file    Forced sexual activity: Not on file  Other Topics Concern  . Not on file  Social History Narrative   Married x 18 years   2 sons--one son died suddenly in 11/14/2006 with no known cause   Health Maintenance  Topic Date Due  . PAP SMEAR  12/25/2016  . COLONOSCOPY  02/03/2018  . INFLUENZA VACCINE  02/28/2018  . HIV Screening  04/04/2024 (Originally 02/04/1981)  . MAMMOGRAM  04/19/2018  . TETANUS/TDAP  01/04/2022    The following  portions of the patient's history were reviewed and updated as appropriate:  She  has a past medical history of Acid reflux disease, Complication of anesthesia, Dizziness, Essential hypertension, Headache(784.0), Hiatal hernia, Hypercholesterolemia, Kidney stone, Neuralgia, Occipital neuralgia, and Overweight(278.02). She does not have any pertinent problems on file. 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 Her family history includes Cancer in her mother; Heart disease in her father and paternal grandfather. She  reports that she has never smoked. She has never used smokeless tobacco. She reports that she does not drink alcohol or use drugs. She has a current medication list which includes the following prescription(s): alprazolam, bupropion, calcium citrate-vitamin d, lisinopril, multivitamin with minerals, and topiramate. Current Outpatient Medications on File Prior to Visit  Medication Sig Dispense Refill  . CALCIUM CITRATE-VITAMIN D PO Take 1 tablet by mouth 3 (three) times daily.    . Multiple Vitamin (MULTIVITAMIN WITH MINERALS) TABS Take 1 tablet by mouth 2 (two) times daily.    . [DISCONTINUED]  topiramate (TOPAMAX) 25 MG capsule Take one tablet by mouth at bedtime x 1 week then 1 po bid thereafter. 60 capsule 5   No current facility-administered medications on file prior to visit.    She has No Known Allergies..  Review of Systems Review of Systems  Constitutional: Negative for activity change, appetite change and fatigue.  HENT: Negative for hearing loss, congestion, tinnitus and ear discharge.  dentist q63m Eyes: Negative for visual disturbance (see optho q1y -- vision corrected to 20/20 with glasses).  Respiratory: Negative for cough, chest tightness and shortness of breath.   Cardiovascular: Negative for chest pain,  palpitations and leg swelling.  Gastrointestinal: Negative for abdominal pain, diarrhea, constipation and abdominal distention.  Genitourinary: Negative for urgency, frequency, decreased urine volume and difficulty urinating.  Musculoskeletal: Negative for back pain, arthralgias and gait problem.  Skin: Negative for color change, pallor and rash.  Neurological: Negative for dizziness, light-headedness, numbness and headaches.  Hematological: Negative for adenopathy. Does not bruise/bleed easily.  Psychiatric/Behavioral: Negative for suicidal ideas, confusion, sleep disturbance, self-injury, dysphoric mood, decreased concentration and agitation.      Objective:    BP (!) 110/58 (BP Location: Right Arm, Cuff Size: Normal)   Pulse 68   Temp 98.1 F (36.7 C) (Oral)   Resp 16   Ht 5' 0.5" (1.537 m)   Wt 143 lb 12.8 oz (65.2 kg)   LMP 11/08/2010   SpO2 100%   BMI 27.62 kg/m  General appearance: alert, cooperative and appears stated age Head: Normocephalic, without obvious abnormality, atraumatic Eyes: conjunctivae/corneas clear. PERRL, EOM's intact. Fundi benign. Ears: normal TM's and external ear canals both ears Nose: Nares normal. Septum midline. Mucosa normal. No drainage or sinus tenderness. Throat: lips, mucosa, and tongue normal; teeth and gums normal Neck: no adenopathy, no carotid bruit, no JVD, supple, symmetrical, trachea midline and thyroid not enlarged, symmetric, no tenderness/mass/nodules Back: symmetric, no curvature. ROM normal. No CVA tenderness. Lungs: clear to auscultation bilaterally Breasts: gyn Heart: regular rate and rhythm, S1, S2 normal, no murmur, click, rub or gallop Abdomen: soft, non-tender; bowel sounds normal; no masses,  no organomegaly Pelvic: deferred --gyn Extremities: extremities normal, atraumatic, no cyanosis or edema    Assessment:    Healthy female exam.      Plan:    gyn utd Check labs  See After Visit Summary for Counseling  Recommendations    1. Preventative health care ghm utd Check labs  See AVS - Comprehensive metabolic panel - CBC with Differential/Platelet - Lipid panel - TSH  2. Essential hypertension Well controlled, no changes to meds. Encouraged heart healthy diet such as the DASH diet and exercise as tolerated.  - Comprehensive metabolic panel - CBC with Differential/Platelet - Lipid panel - TSH - lisinopril (PRINIVIL,ZESTRIL) 10 MG tablet; Take 1 tablet (10 mg total) by mouth daily.  Dispense: 90 tablet; Refill: 1  3. Depression with anxiety Stable-- con't meds - Vitamin D (25 hydroxy) - Vitamin B12 - buPROPion (WELLBUTRIN XL) 300 MG 24 hr tablet; Take 1 tablet (300 mg total) by mouth daily.  Dispense: 90 tablet; Refill: 3  4. Generalized anxiety disorder Stable  Refill meds  - ALPRAZolam (XANAX) 0.5 MG tablet; Take 1/2 to 1 tablet by mouth three times daily as needed  Dispense: 90 tablet; Refill: 1  5. Need for shingles vaccine   - Varicella-zoster vaccine IM (Shingrix)

## 2018-04-04 NOTE — Patient Instructions (Signed)
Preventive Care 40-64 Years, Female Preventive care refers to lifestyle choices and visits with your health care provider that can promote health and wellness. What does preventive care include?  A yearly physical exam. This is also called an annual well check.  Dental exams once or twice a year.  Routine eye exams. Ask your health care provider how often you should have your eyes checked.  Personal lifestyle choices, including: ? Daily care of your teeth and gums. ? Regular physical activity. ? Eating a healthy diet. ? Avoiding tobacco and drug use. ? Limiting alcohol use. ? Practicing safe sex. ? Taking low-dose aspirin daily starting at age 58. ? Taking vitamin and mineral supplements as recommended by your health care provider. What happens during an annual well check? The services and screenings done by your health care provider during your annual well check will depend on your age, overall health, lifestyle risk factors, and family history of disease. Counseling Your health care provider may ask you questions about your:  Alcohol use.  Tobacco use.  Drug use.  Emotional well-being.  Home and relationship well-being.  Sexual activity.  Eating habits.  Work and work Statistician.  Method of birth control.  Menstrual cycle.  Pregnancy history.  Screening You may have the following tests or measurements:  Height, weight, and BMI.  Blood pressure.  Lipid and cholesterol levels. These may be checked every 5 years, or more frequently if you are over 81 years old.  Skin check.  Lung cancer screening. You may have this screening every year starting at age 78 if you have a 30-pack-year history of smoking and currently smoke or have quit within the past 15 years.  Fecal occult blood test (FOBT) of the stool. You may have this test every year starting at age 65.  Flexible sigmoidoscopy or colonoscopy. You may have a sigmoidoscopy every 5 years or a colonoscopy  every 10 years starting at age 30.  Hepatitis C blood test.  Hepatitis B blood test.  Sexually transmitted disease (STD) testing.  Diabetes screening. This is done by checking your blood sugar (glucose) after you have not eaten for a while (fasting). You may have this done every 1-3 years.  Mammogram. This may be done every 1-2 years. Talk to your health care provider about when you should start having regular mammograms. This may depend on whether you have a family history of breast cancer.  BRCA-related cancer screening. This may be done if you have a family history of breast, ovarian, tubal, or peritoneal cancers.  Pelvic exam and Pap test. This may be done every 3 years starting at age 80. Starting at age 36, this may be done every 5 years if you have a Pap test in combination with an HPV test.  Bone density scan. This is done to screen for osteoporosis. You may have this scan if you are at high risk for osteoporosis.  Discuss your test results, treatment options, and if necessary, the need for more tests with your health care provider. Vaccines Your health care provider may recommend certain vaccines, such as:  Influenza vaccine. This is recommended every year.  Tetanus, diphtheria, and acellular pertussis (Tdap, Td) vaccine. You may need a Td booster every 10 years.  Varicella vaccine. You may need this if you have not been vaccinated.  Zoster vaccine. You may need this after age 5.  Measles, mumps, and rubella (MMR) vaccine. You may need at least one dose of MMR if you were born in  1955 or later. You may also need a second dose.  Pneumococcal 13-valent conjugate (PCV13) vaccine. You may need this if you have certain conditions and were not previously vaccinated.  Pneumococcal polysaccharide (PPSV23) vaccine. You may need one or two doses if you smoke cigarettes or if you have certain conditions.  Meningococcal vaccine. You may need this if you have certain  conditions.  Hepatitis A vaccine. You may need this if you have certain conditions or if you travel or work in places where you may be exposed to hepatitis A.  Hepatitis B vaccine. You may need this if you have certain conditions or if you travel or work in places where you may be exposed to hepatitis B.  Haemophilus influenzae type b (Hib) vaccine. You may need this if you have certain conditions.  Talk to your health care provider about which screenings and vaccines you need and how often you need them. This information is not intended to replace advice given to you by your health care provider. Make sure you discuss any questions you have with your health care provider. Document Released: 08/13/2015 Document Revised: 04/05/2016 Document Reviewed: 05/18/2015 Elsevier Interactive Patient Education  2018 [employer] Inc[employer]  

## 2018-04-12 ENCOUNTER — Other Ambulatory Visit: Payer: Self-pay | Admitting: *Deleted

## 2018-04-12 MED ORDER — VITAMIN D (ERGOCALCIFEROL) 1.25 MG (50000 UNIT) PO CAPS: 50000.0000 [IU] | ORAL_CAPSULE | ORAL | 2 refills | Status: DC

## 2018-04-24 DIAGNOSIS — Z1231 Encounter for screening mammogram for malignant neoplasm of breast: Secondary | ICD-10-CM | POA: Diagnosis not present

## 2018-04-24 DIAGNOSIS — Z01419 Encounter for gynecological examination (general) (routine) without abnormal findings: Secondary | ICD-10-CM | POA: Diagnosis not present

## 2018-05-09 ENCOUNTER — Other Ambulatory Visit: Payer: Self-pay | Admitting: Family Medicine

## 2018-06-06 ENCOUNTER — Ambulatory Visit (INDEPENDENT_AMBULATORY_CARE_PROVIDER_SITE_OTHER): Payer: 59

## 2018-06-06 DIAGNOSIS — Z23 Encounter for immunization: Secondary | ICD-10-CM

## 2018-06-06 NOTE — Progress Notes (Addendum)
Patient  in for 2nd Shingrix. Patient  brought brought in Document to be completed. Placed on [employer]. Patient states it can be mailed back to her.

## 2018-06-10 ENCOUNTER — Telehealth: Payer: Self-pay | Admitting: *Deleted

## 2018-06-10 NOTE — Telephone Encounter (Signed)
Completed as much as possible of [employer]ness Form 2019, will mail copy to pt after faxing; forwarded to provider/SLS 11/11

## 2018-07-06 ENCOUNTER — Other Ambulatory Visit: Payer: Self-pay | Admitting: Family Medicine

## 2018-09-04 ENCOUNTER — Other Ambulatory Visit: Payer: Self-pay | Admitting: Family Medicine

## 2018-09-04 DIAGNOSIS — F411 Generalized anxiety disorder: Secondary | ICD-10-CM

## 2018-09-05 NOTE — Telephone Encounter (Signed)
Last filled per database: 08/05/18 Last written: 04/04/18 Last ov: 04/04/18 Next ov: 04/10/19 Contract: none UDS: none

## 2018-10-21 ENCOUNTER — Other Ambulatory Visit: Payer: Self-pay | Admitting: Family Medicine

## 2018-10-21 DIAGNOSIS — F411 Generalized anxiety disorder: Secondary | ICD-10-CM

## 2018-10-24 MED ORDER — ALPRAZOLAM 0.5 MG PO TABS: 0.2500 mg | ORAL_TABLET | Freq: Three times a day (TID) | ORAL | 0 refills | Status: DC | PRN

## 2018-10-24 NOTE — Telephone Encounter (Signed)
Last written: 09/05/18 Last ov:04/04/18 Next ov:04/10/19 Contract: none UDS: none

## 2018-12-21 ENCOUNTER — Other Ambulatory Visit: Payer: Self-pay | Admitting: Family Medicine

## 2018-12-21 DIAGNOSIS — I10 Essential (primary) hypertension: Secondary | ICD-10-CM

## 2019-02-02 ENCOUNTER — Other Ambulatory Visit: Payer: Self-pay | Admitting: Family Medicine

## 2019-02-02 DIAGNOSIS — F411 Generalized anxiety disorder: Secondary | ICD-10-CM

## 2019-02-03 MED ORDER — ALPRAZOLAM 0.5 MG PO TABS: 0.2500 mg | ORAL_TABLET | Freq: Three times a day (TID) | ORAL | 0 refills | Status: DC | PRN

## 2019-03-22 ENCOUNTER — Other Ambulatory Visit: Payer: Self-pay | Admitting: Family Medicine

## 2019-03-22 DIAGNOSIS — F418 Other specified anxiety disorders: Secondary | ICD-10-CM

## 2019-04-09 ENCOUNTER — Other Ambulatory Visit: Payer: Self-pay

## 2019-04-10 ENCOUNTER — Encounter: Payer: Self-pay | Admitting: Family Medicine

## 2019-04-10 ENCOUNTER — Ambulatory Visit (INDEPENDENT_AMBULATORY_CARE_PROVIDER_SITE_OTHER): Payer: 59 | Admitting: Family Medicine

## 2019-04-10 ENCOUNTER — Other Ambulatory Visit (HOSPITAL_BASED_OUTPATIENT_CLINIC_OR_DEPARTMENT_OTHER): Payer: Self-pay | Admitting: Family Medicine

## 2019-04-10 VITALS — BP 110/84 | HR 79 | Temp 97.9°F | Resp 18 | Ht 60.05 in | Wt 147.4 lb

## 2019-04-10 DIAGNOSIS — Z1211 Encounter for screening for malignant neoplasm of colon: Secondary | ICD-10-CM

## 2019-04-10 DIAGNOSIS — F411 Generalized anxiety disorder: Secondary | ICD-10-CM

## 2019-04-10 DIAGNOSIS — E7800 Pure hypercholesterolemia, unspecified: Secondary | ICD-10-CM

## 2019-04-10 DIAGNOSIS — Z23 Encounter for immunization: Secondary | ICD-10-CM | POA: Diagnosis not present

## 2019-04-10 DIAGNOSIS — Z0000 Encounter for general adult medical examination without abnormal findings: Secondary | ICD-10-CM | POA: Diagnosis not present

## 2019-04-10 DIAGNOSIS — Z1231 Encounter for screening mammogram for malignant neoplasm of breast: Secondary | ICD-10-CM

## 2019-04-10 DIAGNOSIS — I10 Essential (primary) hypertension: Secondary | ICD-10-CM

## 2019-04-10 DIAGNOSIS — F418 Other specified anxiety disorders: Secondary | ICD-10-CM | POA: Diagnosis not present

## 2019-04-10 LAB — CBC WITH DIFFERENTIAL/PLATELET
Basophils Absolute: 0 10*3/uL (ref 0.0–0.1)
Basophils Relative: 0.8 % (ref 0.0–3.0)
Eosinophils Absolute: 0.1 10*3/uL (ref 0.0–0.7)
Eosinophils Relative: 1.4 % (ref 0.0–5.0)
HCT: 37.7 % (ref 36.0–46.0)
Hemoglobin: 12.3 g/dL (ref 12.0–15.0)
Lymphocytes Relative: 25.5 % (ref 12.0–46.0)
Lymphs Abs: 1.4 10*3/uL (ref 0.7–4.0)
MCHC: 32.6 g/dL (ref 30.0–36.0)
MCV: 90.7 fl (ref 78.0–100.0)
Monocytes Absolute: 0.4 10*3/uL (ref 0.1–1.0)
Monocytes Relative: 7.7 % (ref 3.0–12.0)
Neutro Abs: 3.5 10*3/uL (ref 1.4–7.7)
Neutrophils Relative %: 64.6 % (ref 43.0–77.0)
Platelets: 185 10*3/uL (ref 150.0–400.0)
RBC: 4.16 Mil/uL (ref 3.87–5.11)
RDW: 15 % (ref 11.5–15.5)
WBC: 5.5 10*3/uL (ref 4.0–10.5)

## 2019-04-10 LAB — COMPREHENSIVE METABOLIC PANEL
ALT: 12 U/L (ref 0–35)
AST: 14 U/L (ref 0–37)
Albumin: 3.9 g/dL (ref 3.5–5.2)
Alkaline Phosphatase: 54 U/L (ref 39–117)
BUN: 11 mg/dL (ref 6–23)
CO2: 27 mEq/L (ref 19–32)
Calcium: 8.6 mg/dL (ref 8.4–10.5)
Chloride: 106 mEq/L (ref 96–112)
Creatinine, Ser: 0.69 mg/dL (ref 0.40–1.20)
GFR: 89.63 mL/min (ref >60.00)
Glucose, Bld: 86 mg/dL (ref 70–99)
Potassium: 4 mEq/L (ref 3.5–5.1)
Sodium: 138 mEq/L (ref 135–145)
Total Bilirubin: 0.4 mg/dL (ref 0.2–1.2)
Total Protein: 6.2 g/dL (ref 6.0–8.3)

## 2019-04-10 LAB — LIPID PANEL
Cholesterol: 186 mg/dL (ref 0–200)
HDL: 61.4 mg/dL (ref >39.00)
LDL Cholesterol: 111 mg/dL — ABNORMAL HIGH (ref 0–99)
NonHDL: 124.3
Total CHOL/HDL Ratio: 3
Triglycerides: 69 mg/dL (ref 0.0–149.0)
VLDL: 13.8 mg/dL (ref 0.0–40.0)

## 2019-04-10 LAB — TSH: TSH: 0.81 u[IU]/mL (ref 0.35–4.50)

## 2019-04-10 MED ORDER — ALPRAZOLAM 0.5 MG PO TABS: 0.2500 mg | ORAL_TABLET | Freq: Three times a day (TID) | ORAL | 0 refills | Status: DC | PRN

## 2019-04-10 MED ORDER — BUPROPION HCL ER (XL) 300 MG PO TB24: 300.0000 mg | ORAL_TABLET | Freq: Every day | ORAL | 3 refills | Status: DC

## 2019-04-10 MED ORDER — LISINOPRIL 10 MG PO TABS: 10.0000 mg | ORAL_TABLET | Freq: Every day | ORAL | 1 refills | Status: DC

## 2019-04-10 NOTE — Progress Notes (Signed)
Subjective:     Katelyn Bailey is a 53 y.o. female and is here for a comprehensive physical exam. The patient reports no problems.  Social History   Socioeconomic History  . Marital status: Married    Spouse name: Gwyndolyn Saxon  . Number of children: 2  . Years of education: Not on file  . Highest education level: Not on file  Occupational History  . Occupation: plant sanitation    Comment: [employer]  Social Needs  . Financial resource strain: Not on file  . Food insecurity    Worry: Not on file    Inability: Not on file  . Transportation needs    Medical: Not on file    Non-medical: Not on file  Tobacco Use  . Smoking status: Never Smoker  . Smokeless tobacco: Never Used  Substance and Sexual Activity  . Alcohol use: No  . Drug use: No  . Sexual activity: Yes    Partners: Male  Lifestyle  . Physical activity    Days per week: Not on file    Minutes per session: Not on file  . Stress: Not on file  Relationships  . Social Herbalist on phone: Not on file    Gets together: Not on file    Attends religious service: Not on file    Active member of club or organization: Not on file    Attends meetings of clubs or organizations: Not on file    Relationship status: Not on file  . Intimate partner violence    Fear of current or ex partner: Not on file    Emotionally abused: Not on file    Physically abused: Not on file    Forced sexual activity: Not on file  Other Topics Concern  . Not on file  Social History Narrative   Married x 18 years   2 sons--one son died suddenly in 11-23-2006 with no known cause   Health Maintenance  Topic Date Due  . COLONOSCOPY  02/03/2018  . HIV Screening  04/04/2024 (Originally 02/04/1981)  . MAMMOGRAM  04/25/2019  . TETANUS/TDAP  01/04/2022  . INFLUENZA VACCINE  Completed    The following portions of the patient's history were reviewed and updated as appropriate:  She  has a past medical history of Acid reflux disease, Complication  of anesthesia, Dizziness, Essential hypertension, Headache(784.0), Hiatal hernia, Hypercholesterolemia, Kidney stone, Neuralgia, Occipital neuralgia, and Overweight(278.02). She does not have any pertinent problems on file. 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 Her family history includes Cancer in her mother; Heart disease in her father and paternal grandfather. She  reports that she has never smoked. She has never used smokeless tobacco. She reports that she does not drink alcohol or use drugs. She has a current medication list which includes the following prescription(s): alprazolam, bupropion, calcium citrate-vitamin d, lisinopril, multivitamin with minerals, vitamin d (ergocalciferol), and topiramate. Current Outpatient Medications on File Prior to Visit  Medication Sig Dispense Refill  . CALCIUM CITRATE-VITAMIN D PO Take 1 tablet by mouth 3 (three) times daily.    . Multiple Vitamin (MULTIVITAMIN WITH MINERALS) TABS Take 1 tablet by mouth 2 (two) times daily.    . Vitamin D, Ergocalciferol, (DRISDOL) 50000 units CAPS capsule TAKE 1 CAPSULE BY MOUTH EVERY 7 DAYS (Patient not taking: Reported on 04/10/2019) 12 capsule 2  . [DISCONTINUED] topiramate (  TOPAMAX) 25 MG capsule Take one tablet by mouth at bedtime x 1 week then 1 po bid thereafter. 60 capsule 5   No current facility-administered medications on file prior to visit.    She has No Known Allergies..  Review of Systems Review of Systems  Constitutional: Negative for activity change, appetite change and fatigue.  HENT: Negative for hearing loss, congestion, tinnitus and ear discharge.  dentist q53m Eyes: Negative for visual disturbance (see optho q1y -- vision corrected to 20/20 with glasses).  Respiratory: Negative for cough, chest tightness and shortness of breath.    Cardiovascular: Negative for chest pain, palpitations and leg swelling.  Gastrointestinal: Negative for abdominal pain, diarrhea, constipation and abdominal distention.  Genitourinary: Negative for urgency, frequency, decreased urine volume and difficulty urinating.  Musculoskeletal: Negative for back pain, arthralgias and gait problem.  Skin: Negative for color change, pallor and rash.  Neurological: Negative for dizziness, light-headedness, numbness and headaches.  Hematological: Negative for adenopathy. Does not bruise/bleed easily.  Psychiatric/Behavioral: Negative for suicidal ideas, confusion, sleep disturbance, self-injury, dysphoric mood, decreased concentration and agitation.        Objective:    BP 110/84 (BP Location: Right Arm, Patient Position: Sitting, Cuff Size: Normal)   Pulse 79   Temp 97.9 F (36.6 C) (Temporal)   Resp 18   Ht 5' 0.05" (1.525 m)   Wt 147 lb 6.4 oz (66.9 kg)   LMP 11/08/2010   SpO2 99%   BMI 28.74 kg/m  General appearance: alert, cooperative, appears stated age and no distress Head: Normocephalic, without obvious abnormality, atraumatic Eyes: conjunctivae/corneas clear. PERRL, EOM's intact. Fundi benign. Ears: normal TM's and external ear canals both ears Nose: Nares normal. Septum midline. Mucosa normal. No drainage or sinus tenderness. Throat: lips, mucosa, and tongue normal; teeth and gums normal Neck: no adenopathy, no carotid bruit, no JVD, supple, symmetrical, trachea midline and thyroid not enlarged, symmetric, no tenderness/mass/nodules Back: symmetric, no curvature. ROM normal. No CVA tenderness. Lungs: clear to auscultation bilaterally Breasts: normal appearance, no masses or tenderness Heart: regular rate and rhythm, S1, S2 normal, no murmur, click, rub or gallop Abdomen: soft, non-tender; bowel sounds normal; no masses,  no organomegaly Pelvic: not indicated; status post hysterectomy, negative ROS Extremities: extremities normal,  atraumatic, no cyanosis or edema Pulses: 2+ and symmetric Skin: Skin color, texture, turgor normal. No rashes or lesions Lymph nodes: Cervical, supraclavicular, and axillary nodes normal. Neurologic: Alert and oriented X 3, normal strength and tone. Normal symmetric reflexes. Normal coordination and gait    Assessment:    Healthy female exam.      Plan:    ghm utd Check labs See After Visit Summary for Counseling Recommendations    1. Generalized anxiety disorder Stable  Refill meds  - ALPRAZolam (XANAX) 0.5 MG tablet; Take 0.5-1 tablets (0.25-0.5 mg total) by mouth 3 (three) times daily as needed.  Dispense: 90 tablet; Refill: 0  2. Depression with anxiety Stable con't meds  - buPROPion (WELLBUTRIN XL) 300 MG 24 hr tablet; Take 1 tablet (300 mg total) by mouth daily.  Dispense: 90 tablet; Refill: 3  3. Essential hypertension Well controlled, no changes to meds. Encouraged heart healthy diet such as the DASH diet and exercise as tolerated.  - lisinopril (ZESTRIL) 10 MG tablet; Take 1 tablet (10 mg total) by mouth daily.  Dispense: 90 tablet; Refill: 1 - CBC with Differential/Platelet - Lipid panel - Comprehensive metabolic panel - TSH  4. Colon cancer screening  - Ambulatory referral to  Gastroenterology  5. Need for influenza vaccination  - Flu Vaccine QUAD 36+ mos IM  6. Preventative health care See above  - CBC with Differential/Platelet - Lipid panel - Comprehensive metabolic panel - TSH

## 2019-04-10 NOTE — Assessment & Plan Note (Signed)
Well controlled, no changes to meds. Encouraged heart healthy diet such as the DASH diet and exercise as tolerated.  °

## 2019-04-10 NOTE — Assessment & Plan Note (Signed)
Tolerating statin, encouraged heart healthy diet, avoid trans fats, minimize simple carbs and saturated fats. Increase exercise as tolerated 

## 2019-04-10 NOTE — Patient Instructions (Signed)

## 2019-04-29 ENCOUNTER — Encounter (HOSPITAL_BASED_OUTPATIENT_CLINIC_OR_DEPARTMENT_OTHER): Payer: Self-pay

## 2019-04-29 ENCOUNTER — Other Ambulatory Visit: Payer: Self-pay

## 2019-04-29 ENCOUNTER — Ambulatory Visit (HOSPITAL_BASED_OUTPATIENT_CLINIC_OR_DEPARTMENT_OTHER)
Admission: RE | Admit: 2019-04-29 | Discharge: 2019-04-29 | Disposition: A | Discharge status: Home / Self Care | Payer: 59 | Source: Ambulatory Visit | Attending: Family Medicine | Admitting: Family Medicine

## 2019-04-29 DIAGNOSIS — Z1231 Encounter for screening mammogram for malignant neoplasm of breast: Secondary | ICD-10-CM

## 2019-04-30 ENCOUNTER — Other Ambulatory Visit: Payer: Self-pay | Admitting: Family Medicine

## 2019-04-30 DIAGNOSIS — R928 Other abnormal and inconclusive findings on diagnostic imaging of breast: Secondary | ICD-10-CM

## 2019-05-05 ENCOUNTER — Ambulatory Visit
Admission: RE | Admit: 2019-05-05 | Discharge: 2019-05-05 | Disposition: A | Discharge status: Home / Self Care | Payer: 59 | Source: Ambulatory Visit | Attending: Family Medicine | Admitting: Family Medicine

## 2019-05-05 ENCOUNTER — Other Ambulatory Visit: Payer: Self-pay

## 2019-05-05 DIAGNOSIS — R928 Other abnormal and inconclusive findings on diagnostic imaging of breast: Secondary | ICD-10-CM

## 2019-05-15 ENCOUNTER — Encounter: Payer: Self-pay | Admitting: Gastroenterology

## 2019-05-29 ENCOUNTER — Encounter: Payer: Self-pay | Admitting: Family Medicine

## 2019-06-04 ENCOUNTER — Encounter: Payer: Self-pay | Admitting: Gastroenterology

## 2019-06-04 ENCOUNTER — Ambulatory Visit (AMBULATORY_SURGERY_CENTER): Payer: 59 | Admitting: *Deleted

## 2019-06-04 ENCOUNTER — Other Ambulatory Visit: Payer: Self-pay

## 2019-06-04 VITALS — Temp 96.6°F | Ht 60.5 in | Wt 151.6 lb

## 2019-06-04 DIAGNOSIS — Z1159 Encounter for screening for other viral diseases: Secondary | ICD-10-CM

## 2019-06-04 DIAGNOSIS — Z1211 Encounter for screening for malignant neoplasm of colon: Secondary | ICD-10-CM

## 2019-06-04 MED ORDER — CLENPIQ 10-3.5-12 MG-GM -GM/160ML PO SOLN: 1.0000 | ORAL | 0 refills | Status: DC

## 2019-06-04 NOTE — Progress Notes (Signed)
No egg or soy allergy known to patient  No issues with past sedation with any surgeries  or procedures, no intubation problems  No diet pills per patient No home 02 use per patient  No blood thinners per patient  Pt denies issues with constipation  No A fib or A flutter  EMMI video sent to pt's e mail   Due to the COVID-19 pandemic we are asking patients to follow these guidelines. Please only bring one care partner. Please be aware that your care partner may wait in the car in the parking lot or if they feel like they will be too hot to wait in the car, they may wait in the lobby on the 4th floor. All care partners are required to wear a mask the entire time (we do not have any that we can provide them), they need to practice social distancing, and we will do a Covid check for all patient's and care partners when you arrive. Also we will check their temperature and your temperature. If the care partner waits in their car they need to stay in the parking lot the entire time and we will call them on their cell phone when the patient is ready for discharge so they can bring the car to the front of the building. Also all patient's will need to wear a mask into building.  COVID SCREENING 06/12/19 8:55   [place]

## 2019-06-09 ENCOUNTER — Other Ambulatory Visit: Payer: Self-pay | Admitting: Family Medicine

## 2019-06-09 DIAGNOSIS — I10 Essential (primary) hypertension: Secondary | ICD-10-CM

## 2019-06-12 ENCOUNTER — Ambulatory Visit (INDEPENDENT_AMBULATORY_CARE_PROVIDER_SITE_OTHER): Payer: 59

## 2019-06-12 ENCOUNTER — Other Ambulatory Visit: Payer: Self-pay | Admitting: Gastroenterology

## 2019-06-12 DIAGNOSIS — Z1159 Encounter for screening for other viral diseases: Secondary | ICD-10-CM

## 2019-06-12 LAB — SARS CORONAVIRUS 2 (TAT 6-24 HRS): SARS Coronavirus 2: NEGATIVE

## 2019-06-17 ENCOUNTER — Encounter: Payer: Self-pay | Admitting: Gastroenterology

## 2019-06-17 ENCOUNTER — Ambulatory Visit (AMBULATORY_SURGERY_CENTER): Payer: 59 | Admitting: Gastroenterology

## 2019-06-17 ENCOUNTER — Other Ambulatory Visit: Payer: Self-pay

## 2019-06-17 VITALS — BP 107/59 | HR 59 | Temp 98.5°F | Resp 14 | Ht 60.0 in | Wt 151.0 lb

## 2019-06-17 DIAGNOSIS — Z1211 Encounter for screening for malignant neoplasm of colon: Secondary | ICD-10-CM

## 2019-06-17 MED ORDER — SODIUM CHLORIDE 0.9 % IV SOLN: 500.0000 mL | Freq: Once | INTRAVENOUS | Status: AC

## 2019-06-17 NOTE — Progress Notes (Signed)
Pt's states no medical or surgical changes since previsit or office visit. 

## 2019-06-17 NOTE — Progress Notes (Signed)
Report to PACU, RN, vss, BBS= Clear.  

## 2019-06-17 NOTE — Op Note (Signed)
[place] Patient Name: Katelyn Bailey Procedure Date: 06/17/2019 8:54 AM MRN: [id number] Endoscopist: Jackquline Denmark , MD Age: 53 Referring MD:  Date of Birth: Jul 22, 1966 Gender: Female Account #: [id number] Procedure:                Colonoscopy Indications:              Screening for colorectal malignant neoplasm Medicines:                Monitored Anesthesia Care Procedure:                Pre-Anesthesia Assessment:                           - Prior to the procedure, a History and Physical                            was performed, and patient medications and                            allergies were reviewed. The patient's tolerance of                            previous anesthesia was also reviewed. The risks                            and benefits of the procedure and the sedation                            options and risks were discussed with the patient.                            All questions were answered, and informed consent                            was obtained. Prior Anticoagulants: The patient has                            taken no previous anticoagulant or antiplatelet                            agents. ASA Grade Assessment: II - A patient with                            mild systemic disease. After reviewing the risks                            and benefits, the patient was deemed in                            satisfactory condition to undergo the procedure.                           After obtaining informed consent, the colonoscope  was passed under direct vision. Throughout the                            procedure, the patient's blood pressure, pulse, and                            oxygen saturations were monitored continuously. The                            Colonoscope was introduced through the anus and                            advanced to the 2 cm into the ileum. The                            colonoscopy was performed  without difficulty. The                            patient tolerated the procedure well. The quality                            of the bowel preparation was good. The terminal                            ileum, ileocecal valve, appendiceal orifice, and                            rectum were photographed. Scope In: 9:00:02 AM Scope Out: 9:12:19 AM Scope Withdrawal Time: 0 hours 8 minutes 13 seconds  Total Procedure Duration: 0 hours 12 minutes 17 seconds  Findings:                 Rare small-mouthed diverticulum was found in the                            sigmoid colon.                           Non-bleeding internal hemorrhoids were found during                            retroflexion. The hemorrhoids were small.                           The terminal ileum appeared normal.                           The exam was otherwise without abnormality on                            direct and retroflexion views. Complications:            No immediate complications. Estimated Blood Loss:     Estimated blood loss: none. Impression:               - Very minimal sigmoid diverticulosis.                           -  Non-bleeding internal hemorrhoids.                           - Otherwise normal colonoscopy to TI. Recommendation:           - Patient has a contact number available for                            emergencies. The signs and symptoms of potential                            delayed complications were discussed with the                            patient. Return to normal activities tomorrow.                            Written discharge instructions were provided to the                            patient.                           - Resume previous diet.                           - Continue present medications.                           - Repeat colonoscopy in 10 years for screening                            purposes. Earlier, if with any new problems or if                            there is  any change in family history.                           - Return to GI clinic PRN. Jackquline Denmark, MD 06/17/2019 9:16:51 AM This report has been signed electronically.

## 2019-06-17 NOTE — Patient Instructions (Signed)
YOU HAD AN ENDOSCOPIC PROCEDURE TODAY AT [facility]:   Refer to the procedure report that was given to you for any specific questions about what was found during the examination.  If the procedure report does not answer your questions, please call your gastroenterologist to clarify.  If you requested that your care partner not be given the details of your procedure findings, then the procedure report has been included in a sealed envelope for you to review at your convenience later.  YOU SHOULD EXPECT: Some feelings of bloating in the abdomen. Passage of more gas than usual.  Walking can help get rid of the air that was put into your GI tract during the procedure and reduce the bloating. If you had a lower endoscopy (such as a colonoscopy or flexible sigmoidoscopy) you may notice spotting of blood in your stool or on the toilet paper. If you underwent a bowel prep for your procedure, you may not have a normal bowel movement for a few days.  Please Note:  You might notice some irritation and congestion in your nose or some drainage.  This is from the oxygen used during your procedure.  There is no need for concern and it should clear up in a day or so.  SYMPTOMS TO REPORT IMMEDIATELY:   Following lower endoscopy (colonoscopy or flexible sigmoidoscopy):  Excessive amounts of blood in the stool  Significant tenderness or worsening of abdominal pains  Swelling of the abdomen that is new, acute  Fever of 100F or higher   For urgent or emergent issues, a gastroenterologist can be reached at any hour by calling [phone].   DIET:  We do recommend a small meal at first, but then you may proceed to your regular diet.  Drink plenty of fluids but you should avoid alcoholic beverages for 24 hours.  ACTIVITY:  You should plan to take it easy for the rest of today and you should NOT DRIVE or use heavy machinery until tomorrow (because of the sedation medicines used during the test).     FOLLOW UP: Our staff will call the number listed on your records 48-72 hours following your procedure to check on you and address any questions or concerns that you may have regarding the information given to you following your procedure. If we do not reach you, we will leave a message.  We will attempt to reach you two times.  During this call, we will ask if you have developed any symptoms of COVID 19. If you develop any symptoms (ie: fever, flu-like symptoms, shortness of breath, cough etc.) before then, please call [phone].  If you test positive for Covid 19 in the 2 weeks post procedure, please call and report this information to [place].    If any biopsies were taken you will be contacted by phone or by letter within the next 1-3 weeks.  Please call us at [phone] if you have not heard about the biopsies in 3 weeks.    SIGNATURES/CONFIDENTIALITY: You and/or your care partner have signed paperwork which will be entered into your electronic medical record.  These signatures attest to the fact that that the information above on your After Visit Summary has been reviewed and is understood.  Full responsibility of the confidentiality of this discharge information lies with you and/or your care-partner.   Resume medications. Information given on diverticulosis and hemorrhoids.

## 2019-06-19 ENCOUNTER — Telehealth: Payer: Self-pay

## 2019-06-19 NOTE — Telephone Encounter (Signed)
  Follow up Call-  Call back number 06/17/2019  Post procedure Call Back phone  # [phone]  Permission to leave phone message Yes  Some recent data might be hidden     Patient questions:  Do you have a fever, pain , or abdominal swelling? No. Pain Score  0 *  Have you tolerated food without any problems? Yes.    Have you been able to return to your normal activities? Yes.    Do you have any questions about your discharge instructions: Diet   No. Medications  No. Follow up visit  No.  Do you have questions or concerns about your Care? No.  Actions: * If pain score is 4 or above: No action needed, pain <4. 1. Have you developed a fever since your procedure? no  2.   Have you had an respiratory symptoms (SOB or cough) since your procedure? no  3.   Have you tested positive for COVID 19 since your procedure no  4.   Have you had any family members/close contacts diagnosed with the COVID 19 since your procedure?  no   If yes to any of these questions please route to Joylene John, RN and Alphonsa Gin, Therapist, sports.

## 2019-07-23 ENCOUNTER — Other Ambulatory Visit: Payer: Self-pay | Admitting: Family Medicine

## 2019-07-23 DIAGNOSIS — F411 Generalized anxiety disorder: Secondary | ICD-10-CM

## 2019-07-23 MED ORDER — ALPRAZOLAM 0.5 MG PO TABS: 0.2500 mg | ORAL_TABLET | Freq: Three times a day (TID) | ORAL | 0 refills | Status: DC | PRN

## 2019-07-23 NOTE — Telephone Encounter (Signed)
Requesting: Xanax Contract: N/A UDS: N/A Last OV: 04/10/2019 Next OV: 10/10/2019 Last Refill: 04/10/2019, #[id number] RF Database:   Please advise

## 2019-08-19 ENCOUNTER — Encounter: Payer: Self-pay | Admitting: Family Medicine

## 2019-08-25 ENCOUNTER — Encounter: Payer: Self-pay | Admitting: Family Medicine

## 2019-08-25 NOTE — Telephone Encounter (Signed)
She needs to quarantine at home  If symptoms progress -- let us know  We can do a virtual visit and get her in resp clinic if needed

## 2019-09-22 ENCOUNTER — Encounter: Payer: Self-pay | Admitting: Family Medicine

## 2019-09-22 ENCOUNTER — Other Ambulatory Visit: Payer: Self-pay

## 2019-09-22 ENCOUNTER — Ambulatory Visit (INDEPENDENT_AMBULATORY_CARE_PROVIDER_SITE_OTHER): Payer: 59 | Admitting: Family Medicine

## 2019-09-22 VITALS — Temp 97.3°F | Ht 60.0 in

## 2019-09-22 DIAGNOSIS — G4700 Insomnia, unspecified: Secondary | ICD-10-CM

## 2019-09-22 MED ORDER — TRAZODONE HCL 50 MG PO TABS: 25.0000 mg | ORAL_TABLET | Freq: Every evening | ORAL | 3 refills | Status: DC | PRN

## 2019-09-22 NOTE — Progress Notes (Signed)
Virtual Visit via Video Note  I connected with ELISSE FRAPPIER on 09/22/19 at  3:40 PM EST by a video enabled telemedicine application and verified that I am speaking with the correct person using two identifiers.  Location: Patient: home  Provider: office    I discussed the limitations of evaluation and management by telemedicine and the availability of in person appointments. The patient expressed understanding and agreed to proceed.  History of Present Illness: Pt is home and c/o insomnia-----he hospital is in the hosp with covid and on ecmo machine in icu.  She has not been sleeping and pallative care advised she call to see if we can help with that.  The xanax is not helping    Observations/Objective: Vitals:   09/22/19 1545  Temp: (!) 97.3 F (36.3 C)   Pt is not suicidal-- she is just very tired / worried about her husband.   Assessment and Plan: 1. Insomnia, unspecified type Stress induced--  trazadone-- start with 1/2 tab , may take 1 C/u prn  - traZODone (DESYREL) 50 MG tablet; Take 0.5-1 tablets (25-50 mg total) by mouth at bedtime as needed for sleep.  Dispense: 30 tablet; Refill: 3   Follow Up Instructions:    I discussed the assessment and treatment plan with the patient. The patient was provided an opportunity to ask questions and all were answered. The patient agreed with the plan and demonstrated an understanding of the instructions.   The patient was advised to call back or seek an in-person evaluation if the symptoms worsen or if the condition fails to improve as anticipated.  I provided 20 minutes of non-face-to-face time during this encounter.   Ann Held, DO

## 2019-10-09 ENCOUNTER — Other Ambulatory Visit: Payer: Self-pay

## 2019-10-10 ENCOUNTER — Ambulatory Visit (INDEPENDENT_AMBULATORY_CARE_PROVIDER_SITE_OTHER): Payer: BC Managed Care – PPO | Admitting: Family Medicine

## 2019-10-10 ENCOUNTER — Other Ambulatory Visit: Payer: Self-pay

## 2019-10-10 ENCOUNTER — Encounter: Payer: Self-pay | Admitting: Family Medicine

## 2019-10-10 VITALS — BP 110/78 | HR 69 | Temp 97.3°F | Resp 18 | Ht 60.0 in | Wt 144.6 lb

## 2019-10-10 DIAGNOSIS — F411 Generalized anxiety disorder: Secondary | ICD-10-CM

## 2019-10-10 DIAGNOSIS — F5104 Psychophysiologic insomnia: Secondary | ICD-10-CM

## 2019-10-10 DIAGNOSIS — D509 Iron deficiency anemia, unspecified: Secondary | ICD-10-CM | POA: Diagnosis not present

## 2019-10-10 DIAGNOSIS — I10 Essential (primary) hypertension: Secondary | ICD-10-CM

## 2019-10-10 LAB — LIPID PANEL
Cholesterol: 202 mg/dL — ABNORMAL HIGH (ref 0–200)
HDL: 63.8 mg/dL (ref >39.00)
LDL Cholesterol: 125 mg/dL — ABNORMAL HIGH (ref 0–99)
NonHDL: 138.01
Total CHOL/HDL Ratio: 3
Triglycerides: 66 mg/dL (ref 0.0–149.0)
VLDL: 13.2 mg/dL (ref 0.0–40.0)

## 2019-10-10 LAB — CBC WITH DIFFERENTIAL/PLATELET
Basophils Absolute: 0.1 10*3/uL (ref 0.0–0.1)
Basophils Relative: 1 % (ref 0.0–3.0)
Eosinophils Absolute: 0.1 10*3/uL (ref 0.0–0.7)
Eosinophils Relative: 1.2 % (ref 0.0–5.0)
HCT: 36.9 % (ref 36.0–46.0)
Hemoglobin: 12.2 g/dL (ref 12.0–15.0)
Lymphocytes Relative: 22.4 % (ref 12.0–46.0)
Lymphs Abs: 1.1 10*3/uL (ref 0.7–4.0)
MCHC: 32.9 g/dL (ref 30.0–36.0)
MCV: 90.1 fl (ref 78.0–100.0)
Monocytes Absolute: 0.5 10*3/uL (ref 0.1–1.0)
Monocytes Relative: 9.5 % (ref 3.0–12.0)
Neutro Abs: 3.4 10*3/uL (ref 1.4–7.7)
Neutrophils Relative %: 65.9 % (ref 43.0–77.0)
Platelets: 190 10*3/uL (ref 150.0–400.0)
RBC: 4.1 Mil/uL (ref 3.87–5.11)
RDW: 14.1 % (ref 11.5–15.5)
WBC: 5.1 10*3/uL (ref 4.0–10.5)

## 2019-10-10 LAB — COMPREHENSIVE METABOLIC PANEL
ALT: 8 U/L (ref 0–35)
AST: 11 U/L (ref 0–37)
Albumin: 4 g/dL (ref 3.5–5.2)
Alkaline Phosphatase: 57 U/L (ref 39–117)
BUN: 10 mg/dL (ref 6–23)
CO2: 28 mEq/L (ref 19–32)
Calcium: 9.1 mg/dL (ref 8.4–10.5)
Chloride: 105 mEq/L (ref 96–112)
Creatinine, Ser: 0.72 mg/dL (ref 0.40–1.20)
GFR: 85.17 mL/min (ref >60.00)
Glucose, Bld: 76 mg/dL (ref 70–99)
Potassium: 4.5 mEq/L (ref 3.5–5.1)
Sodium: 140 mEq/L (ref 135–145)
Total Bilirubin: 0.4 mg/dL (ref 0.2–1.2)
Total Protein: 6.3 g/dL (ref 6.0–8.3)

## 2019-10-10 LAB — IBC + FERRITIN
Ferritin: 5.1 ng/mL — ABNORMAL LOW (ref 10.0–291.0)
Iron: 60 ug/dL (ref 42–145)
Saturation Ratios: 15 % — ABNORMAL LOW (ref 20.0–50.0)
Transferrin: 286 mg/dL (ref 212.0–360.0)

## 2019-10-10 MED ORDER — ALPRAZOLAM 0.5 MG PO TABS: 0.2500 mg | ORAL_TABLET | Freq: Three times a day (TID) | ORAL | 0 refills | Status: DC | PRN

## 2019-10-10 NOTE — Assessment & Plan Note (Signed)
Check labs 

## 2019-10-10 NOTE — Assessment & Plan Note (Signed)
trazadone prn

## 2019-10-10 NOTE — Patient Instructions (Signed)
DASH Eating Plan DASH stands for "Dietary Approaches to Stop Hypertension." The DASH eating plan is a healthy eating plan that has been shown to reduce high blood pressure (hypertension). It may also reduce your risk for type 2 diabetes, heart disease, and stroke. The DASH eating plan may also help with weight loss. What are tips for following this plan?  General guidelines  Avoid eating more than 2,300 mg (milligrams) of salt (sodium) a day. If you have hypertension, you may need to reduce your sodium intake to 1,500 mg a day.  Limit alcohol intake to no more than 1 drink a day for nonpregnant women and 2 drinks a day for men. One drink equals 12 oz of beer, 5 oz of wine, or 1 oz of hard liquor.  Work with your health care provider to maintain a healthy body weight or to lose weight. Ask what an ideal weight is for you.  Get at least 30 minutes of exercise that causes your heart to beat faster (aerobic exercise) most days of the week. Activities may include walking, swimming, or biking.  Work with your health care provider or diet and nutrition specialist (dietitian) to adjust your eating plan to your individual calorie needs. Reading food labels   Check food labels for the amount of sodium per serving. Choose foods with less than 5 percent of the Daily Value of sodium. Generally, foods with less than 300 mg of sodium per serving fit into this eating plan.  To find whole grains, look for the word "whole" as the first word in the ingredient list. Shopping  Buy products labeled as "low-sodium" or "no salt added."  Buy fresh foods. Avoid canned foods and premade or frozen meals. Cooking  Avoid adding salt when cooking. Use salt-free seasonings or herbs instead of table salt or sea salt. Check with your health care provider or pharmacist before using salt substitutes.  Do not fry foods. Cook foods using healthy methods such as baking, boiling, grilling, and broiling instead.  Cook with  heart-healthy oils, such as olive, canola, soybean, or sunflower oil. Meal planning  Eat a balanced diet that includes: ? 5 or more servings of fruits and vegetables each day. At each meal, try to fill half of your plate with fruits and vegetables. ? Up to 6-8 servings of whole grains each day. ? Less than 6 oz of lean meat, poultry, or fish each day. A 3-oz serving of meat is about the same size as a deck of cards. One egg equals 1 oz. ? 2 servings of low-fat dairy each day. ? A serving of nuts, seeds, or beans 5 times each week. ? Heart-healthy fats. Healthy fats called Omega-3 fatty acids are found in foods such as flaxseeds and coldwater fish, like sardines, salmon, and mackerel.  Limit how much you eat of the following: ? Canned or prepackaged foods. ? Food that is high in trans fat, such as fried foods. ? Food that is high in saturated fat, such as fatty meat. ? Sweets, desserts, sugary drinks, and other foods with added sugar. ? Full-fat dairy products.  Do not salt foods before eating.  Try to eat at least 2 vegetarian meals each week.  Eat more home-cooked food and less restaurant, buffet, and fast food.  When eating at a restaurant, ask that your food be prepared with less salt or no salt, if possible. What foods are recommended? The items listed may not be a complete list. Talk with your dietitian about   what dietary choices are best for you. Grains Whole-grain or whole-wheat bread. Whole-grain or whole-wheat pasta. Brown rice. Oatmeal. Quinoa. Bulgur. Whole-grain and low-sodium cereals. Pita bread. Low-fat, low-sodium crackers. Whole-wheat flour tortillas. Vegetables Fresh or frozen vegetables (raw, steamed, roasted, or grilled). Low-sodium or reduced-sodium tomato and vegetable juice. Low-sodium or reduced-sodium tomato sauce and tomato paste. Low-sodium or reduced-sodium canned vegetables. Fruits All fresh, dried, or frozen fruit. Canned fruit in natural juice (without  added sugar). Meat and other protein foods Skinless chicken or turkey. Ground chicken or turkey. Pork with fat trimmed off. Fish and seafood. Egg whites. Dried beans, peas, or lentils. Unsalted nuts, nut butters, and seeds. Unsalted canned beans. Lean cuts of beef with fat trimmed off. Low-sodium, lean deli meat. Dairy Low-fat (1%) or fat-free (skim) milk. Fat-free, low-fat, or reduced-fat cheeses. Nonfat, low-sodium ricotta or cottage cheese. Low-fat or nonfat yogurt. Low-fat, low-sodium cheese. Fats and oils Soft margarine without trans fats. Vegetable oil. Low-fat, reduced-fat, or light mayonnaise and salad dressings (reduced-sodium). Canola, safflower, olive, soybean, and sunflower oils. Avocado. Seasoning and other foods Herbs. Spices. Seasoning mixes without salt. Unsalted popcorn and pretzels. Fat-free sweets. What foods are not recommended? The items listed may not be a complete list. Talk with your dietitian about what dietary choices are best for you. Grains Baked goods made with fat, such as croissants, muffins, or some breads. Dry pasta or rice meal packs. Vegetables Creamed or fried vegetables. Vegetables in a cheese sauce. Regular canned vegetables (not low-sodium or reduced-sodium). Regular canned tomato sauce and paste (not low-sodium or reduced-sodium). Regular tomato and vegetable juice (not low-sodium or reduced-sodium). Pickles. Olives. Fruits Canned fruit in a light or heavy syrup. Fried fruit. Fruit in cream or butter sauce. Meat and other protein foods Fatty cuts of meat. Ribs. Fried meat. Bacon. Sausage. Bologna and other processed lunch meats. Salami. Fatback. Hotdogs. Bratwurst. Salted nuts and seeds. Canned beans with added salt. Canned or smoked fish. Whole eggs or egg yolks. Chicken or turkey with skin. Dairy Whole or 2% milk, cream, and half-and-half. Whole or full-fat cream cheese. Whole-fat or sweetened yogurt. Full-fat cheese. Nondairy creamers. Whipped toppings.  Processed cheese and cheese spreads. Fats and oils Butter. Stick margarine. Lard. Shortening. Ghee. Bacon fat. Tropical oils, such as coconut, palm kernel, or palm oil. Seasoning and other foods Salted popcorn and pretzels. Onion salt, garlic salt, seasoned salt, table salt, and sea salt. Worcestershire sauce. Tartar sauce. Barbecue sauce. Teriyaki sauce. Soy sauce, including reduced-sodium. Steak sauce. Canned and packaged gravies. Fish sauce. Oyster sauce. Cocktail sauce. Horseradish that you find on the shelf. Ketchup. Mustard. Meat flavorings and tenderizers. Bouillon cubes. Hot sauce and Tabasco sauce. Premade or packaged marinades. Premade or packaged taco seasonings. Relishes. Regular salad dressings. Where to find more information:  National Heart, Lung, and Blood Institute: www.nhlbi.nih.gov  American Heart Association: www.heart.org Summary  The DASH eating plan is a healthy eating plan that has been shown to reduce high blood pressure (hypertension). It may also reduce your risk for type 2 diabetes, heart disease, and stroke.  With the DASH eating plan, you should limit salt (sodium) intake to 2,300 mg a day. If you have hypertension, you may need to reduce your sodium intake to 1,500 mg a day.  When on the DASH eating plan, aim to eat more fresh fruits and vegetables, whole grains, lean proteins, low-fat dairy, and heart-healthy fats.  Work with your health care provider or diet and nutrition specialist (dietitian) to adjust your eating plan to your   individual calorie needs. This information is not intended to replace advice given to you by your health care provider. Make sure you discuss any questions you have with your health care provider. Document Revised: 06/29/2017 Document Reviewed: 07/10/2016 Elsevier Patient Education  2020 [employer]  

## 2019-10-10 NOTE — Assessment & Plan Note (Signed)
Well controlled, no changes to meds. Encouraged heart healthy diet such as the DASH diet and exercise as tolerated.  °

## 2019-10-10 NOTE — Assessment & Plan Note (Signed)
Stable

## 2019-10-10 NOTE — Progress Notes (Signed)
Patient ID: Katelyn Bailey, female    DOB: 1966/06/16  Age: 54 y.o. MRN: [id number]    Subjective:  Subjective  HPI Katelyn Bailey presents for f/u bp no complaints Using the trazadone only occasionally Her husband is still on the ecmo machine at [facility]   Review of Systems  Constitutional: Negative for appetite change, diaphoresis, fatigue and unexpected weight change.  Eyes: Negative for pain, redness and visual disturbance.  Respiratory: Negative for cough, chest tightness, shortness of breath and wheezing.   Cardiovascular: Negative for chest pain, palpitations and leg swelling.  Endocrine: Negative for cold intolerance, heat intolerance, polydipsia, polyphagia and polyuria.  Genitourinary: Negative for difficulty urinating, dysuria and frequency.  Neurological: Negative for dizziness, light-headedness, numbness and headaches.    History Past Medical History:  Diagnosis Date  . Acid reflux disease    resolved with wt loss  . Anxiety   . Cancer ([place])    SKIN CA  . Complication of anesthesia    headaches postop  . Dizziness   . Essential hypertension   . Headache(784.0)   . Hiatal hernia    resolved after gastric bypass  . Hypercholesterolemia   . Kidney stone   . Neuralgia    occipital  . Occipital neuralgia   . 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   Her family history includes Cancer in her mother; Heart disease in her father and paternal grandfather.She reports that she has never smoked. She has never used smokeless tobacco. She reports that she does not drink alcohol or use drugs.  Current Outpatient Medications on File Prior to Visit  Medication Sig Dispense Refill  . buPROPion (WELLBUTRIN XL) 300 MG 24 hr tablet Take 1 tablet (300 mg total) by mouth daily. 90 tablet 3  . CALCIUM  CITRATE-VITAMIN D PO Take 1 tablet by mouth 3 (three) times daily.    Marland Kitchen lisinopril (ZESTRIL) 10 MG tablet TAKE 1 TABLET DAILY 90 tablet 1  . Multiple Vitamin (MULTIVITAMIN WITH MINERALS) TABS Take 1 tablet by mouth 2 (two) times daily.    Marland Kitchen OVER THE COUNTER MEDICATION     . traZODone (DESYREL) 50 MG tablet Take 0.5-1 tablets (25-50 mg total) by mouth at bedtime as needed for sleep. 30 tablet 3  . Vitamin D, Ergocalciferol, (DRISDOL) 50000 units CAPS capsule TAKE 1 CAPSULE BY MOUTH EVERY 7 DAYS 12 capsule 2  . [DISCONTINUED] topiramate (TOPAMAX) 25 MG capsule Take one tablet by mouth at bedtime x 1 week then 1 po bid thereafter. 60 capsule 5   Current Facility-Administered Medications on File Prior to Visit  Medication Dose Route Frequency Provider Last Rate Last Admin  . 0.9 %  sodium chloride infusion  500 mL Intravenous Once Jackquline Denmark, MD         Objective:  Objective  Physical Exam Vitals and nursing note reviewed.  Constitutional:      General: She is not in acute distress.    Appearance: She is well-developed.  HENT:     Head: Normocephalic and atraumatic.     Right Ear: External ear normal.     Left Ear: External ear normal.     Nose: Nose normal.  Eyes:     Conjunctiva/sclera: Conjunctivae normal.     Pupils: Pupils are equal, round, and reactive to light.  Neck:     Thyroid: No thyromegaly.  Vascular: No carotid bruit or JVD.  Cardiovascular:     Rate and Rhythm: Normal rate and regular rhythm.     Heart sounds: Normal heart sounds. No murmur.  Pulmonary:     Effort: Pulmonary effort is normal. No respiratory distress.     Breath sounds: Normal breath sounds. No wheezing or rales.  Chest:     Chest wall: No tenderness.  Musculoskeletal:     Cervical back: Normal range of motion and neck supple.  Neurological:     Mental Status: She is alert and oriented to person, place, and time.  Psychiatric:        Behavior: Behavior normal.        Thought Content:  Thought content normal.        Judgment: Judgment normal.    BP 110/78 (BP Location: Right Arm, Patient Position: Sitting, Cuff Size: Normal)   Pulse 69   Temp (!) 97.3 F (36.3 C) (Temporal)   Resp 18   Ht 5' (1.524 m)   Wt 144 lb 9.6 oz (65.6 kg)   LMP 11/08/2010   SpO2 99%   BMI 28.24 kg/m  Wt Readings from Last 3 Encounters:  10/10/19 144 lb 9.6 oz (65.6 kg)  06/17/19 151 lb (68.5 kg)  06/04/19 151 lb 9.6 oz (68.8 kg)     Lab Results  Component Value Date   WBC 5.5 04/10/2019   HGB 12.3 04/10/2019   HCT 37.7 04/10/2019   PLT 185.0 04/10/2019   GLUCOSE 86 04/10/2019   CHOL 186 04/10/2019   TRIG 69.0 04/10/2019   HDL 61.40 04/10/2019   LDLCALC 111 (H) 04/10/2019   ALT 12 04/10/2019   AST 14 04/10/2019   NA 138 04/10/2019   K 4.0 04/10/2019   CL 106 04/10/2019   CREATININE 0.69 04/10/2019   BUN 11 04/10/2019   CO2 27 04/10/2019   TSH 0.81 04/10/2019   INR 1.12 03/29/2012    US BREAST LTD UNI RIGHT INC AXILLA  Result Date: 05/05/2019 CLINICAL DATA:  Recall from screening mammogram for a questioned asymmetry in the right breast. EXAM: DIGITAL DIAGNOSTIC RIGHT MAMMOGRAM WITH CAD AND TOMO ULTRASOUND RIGHT BREAST COMPARISON:  Previous exam(s). ACR Breast Density Category b: There are scattered areas of fibroglandular density. FINDINGS: The previously described possible asymmetry does not persist with additional views, consistent with superimposed fibroglandular tissue. No suspicious mass, microcalcification, or other finding is identified. Mammographic images were processed with CAD. Targeted right breast ultrasound was performed from the 10 o'clock to the 12 o'clock position in the area of the previously questioned asymmetry. No suspicious solid or cystic mass is identified. IMPRESSION: No evidence of malignancy in the right breast. RECOMMENDATION: Recommend routine annual screening mammogram in 1 year. I have discussed the findings and recommendations with the patient. If  applicable, a reminder letter will be sent to the patient regarding the next appointment. BI-RADS CATEGORY  1: Negative. Electronically Signed   By: Zerita Boers M.D.   On: 05/05/2019 08:26   MM DIAG BREAST TOMO UNI RIGHT  Result Date: 05/05/2019 CLINICAL DATA:  Recall from screening mammogram for a questioned asymmetry in the right breast. EXAM: DIGITAL DIAGNOSTIC RIGHT MAMMOGRAM WITH CAD AND TOMO ULTRASOUND RIGHT BREAST COMPARISON:  Previous exam(s). ACR Breast Density Category b: There are scattered areas of fibroglandular density. FINDINGS: The previously described possible asymmetry does not persist with additional views, consistent with superimposed fibroglandular tissue. No suspicious mass, microcalcification, or other finding is identified. Mammographic images were processed with  CAD. Targeted right breast ultrasound was performed from the 10 o'clock to the 12 o'clock position in the area of the previously questioned asymmetry. No suspicious solid or cystic mass is identified. IMPRESSION: No evidence of malignancy in the right breast. RECOMMENDATION: Recommend routine annual screening mammogram in 1 year. I have discussed the findings and recommendations with the patient. If applicable, a reminder letter will be sent to the patient regarding the next appointment. BI-RADS CATEGORY  1: Negative. Electronically Signed   By: Zerita Boers M.D.   On: 05/05/2019 08:26     Assessment & Plan:  Plan  I am having Keishia C. Tri maintain her multivitamin with minerals, CALCIUM CITRATE-VITAMIN D PO, Vitamin D (Ergocalciferol), buPROPion, OVER THE COUNTER MEDICATION, lisinopril, traZODone, and ALPRAZolam. We will continue to administer sodium chloride.  Meds ordered this encounter  Medications  . ALPRAZolam (XANAX) 0.5 MG tablet    Sig: Take 0.5-1 tablets (0.25-0.5 mg total) by mouth 3 (three) times daily as needed.    Dispense:  90 tablet    Refill:  0    Problem List Items Addressed This Visit        Unprioritized   Anemia    Check labs       Relevant Orders   CBC with Differential/Platelet   IBC + Ferritin   Anxiety state    Stable       Relevant Medications   ALPRAZolam (XANAX) 0.5 MG tablet   Essential hypertension - Primary    Well controlled, no changes to meds. Encouraged heart healthy diet such as the DASH diet and exercise as tolerated.       Relevant Orders   Lipid panel   Comprehensive metabolic panel   Psychophysiological insomnia    trazadone prn        Other Visit Diagnoses    Generalized anxiety disorder       Relevant Medications   ALPRAZolam (XANAX) 0.5 MG tablet      Follow-up: Return in about 6 months (around 04/11/2020) for annual exam, fasting.  Ann Held, DO

## 2019-10-13 DIAGNOSIS — F4322 Adjustment disorder with anxiety: Secondary | ICD-10-CM | POA: Diagnosis not present

## 2019-10-23 DIAGNOSIS — F411 Generalized anxiety disorder: Secondary | ICD-10-CM | POA: Diagnosis not present

## 2019-12-18 ENCOUNTER — Telehealth: Payer: BC Managed Care – PPO | Admitting: Emergency Medicine

## 2019-12-18 DIAGNOSIS — L719 Rosacea, unspecified: Secondary | ICD-10-CM | POA: Diagnosis not present

## 2019-12-18 MED ORDER — DOXYCYCLINE HYCLATE 100 MG PO CAPS: 100.0000 mg | ORAL_CAPSULE | Freq: Two times a day (BID) | ORAL | 0 refills | Status: DC

## 2019-12-18 MED ORDER — METRONIDAZOLE 1 % EX CREA: TOPICAL_CREAM | Freq: Every day | CUTANEOUS | 0 refills | Status: DC

## 2019-12-18 NOTE — Progress Notes (Signed)
E visit for Rosacea We are sorry that you are not feeling well. Here is how we plan to help! Based on what you shared with me it looks like you have Rosacea.  Rosacea is a common chronic skin condition that usually only affects the face and eyes.  Occasionally, the neck, chest, or other areas may be involved.  Characterized by redness, pimples, and broken blood vessels, rosacea tends to begin after middle age (between the ages of 40 and 88).  It is more common in fair-skinned people and women in menopause. It may appear differently in dark skinned people but rosacea effects all ethnic groups.  The cause of rosacea is not fully understood. We do know that rosacea is worsened by various trigger factors including, spicy or hot foods, hot beverages such as coffee or tea, alcohol, and sun exposure just to name a few.  Signs of rosacea may vary greatly from person to person. In some individuals it may only flare up from time to time.   I have prescribed: A topical antibiotic called Metronidazole 1%.  Apply a thin film to affected area once daily.  Wash your hands before and after use. Make sure your skin is clean and dry.  Rub in gently and completely over the affected areas.  An oral antibiotic called Doxycycline.  Take 1 tablet every 12 hrs for 10 days.   HOME CARE: . Keep a record of triggers, such as stress, weather, or certain foods or drinks. Consider limiting hot or spicy foods and alcohol. . Always use sunscreen that protects against UVA and UVB rays and has a sun-protecting factor (SPF) of 15 or higher. . Avoid putting steroids on the skin sores. Steroids may make rosacea worse.  . You may use small amounts of water based cosmetic while using this medication.  Apply cosmetics after cream has dried. . If you shave your face, use an Copy . Don't scrub your skin or use sponges, brushes, or other abrasive tools. Doing so can irritate your skin. GET HELP RIGHT AWAY IF: . If your rosacea  gets worse or is not better within 4 weeks. . If a new skin condition or rash develops. . Loss of feeling or tingling of treated area . Nausea  MAKE SURE YOU    Understand these instructions.  Will watch your condition.  Will get help right away if you are not doing well or get worse.  Your e-visit answers were reviewed by a board certified advanced clinical practitioner to complete your personal care plan. Depending upon the condition, your plan could have included both over the counter or prescription medications. Please review your pharmacy choice. Be sure that the pharmacy you have chosen is open so that you can pick up your prescription now.  If there is a problem you may message your provider in [place] to have the prescription routed to another pharmacy. Your safety is important to [place]. If you have drug allergies check your prescription carefully.  For the next 24 hours, you can use MyChart to ask questions about today's visit, request a non-urgent call back, or ask for a work or school excuse from your e-visit provider. You will get an email in the next two days asking about your experience. I hope that your e-visit has been valuable and will speed your recovery.   Greater than 5 minutes, yet less than 10 minutes was used in reviewing the patient's chart, questionnaire, prescribing medications, and documentation for this visit.

## 2019-12-22 ENCOUNTER — Other Ambulatory Visit: Payer: Self-pay | Admitting: Emergency Medicine

## 2019-12-22 MED ORDER — METRONIDAZOLE 1 % EX CREA: TOPICAL_CREAM | Freq: Every day | CUTANEOUS | 0 refills | Status: DC

## 2019-12-22 MED ORDER — METRONIDAZOLE 0.75 % EX LOTN: TOPICAL_LOTION | CUTANEOUS | 0 refills | Status: AC

## 2020-01-02 ENCOUNTER — Other Ambulatory Visit: Payer: Self-pay | Admitting: Family Medicine

## 2020-01-02 DIAGNOSIS — F411 Generalized anxiety disorder: Secondary | ICD-10-CM

## 2020-01-02 NOTE — Telephone Encounter (Signed)
Will need contract and uds

## 2020-01-02 NOTE — Telephone Encounter (Signed)
Requesting:Alprazolam TKZ:SWFU XNA:TFTD Last Visit:10/10/2019 Next Visit:none scheduled Last Refill:10/10/2019  Please Advise

## 2020-01-12 ENCOUNTER — Other Ambulatory Visit: Payer: Self-pay | Admitting: Family Medicine

## 2020-01-12 DIAGNOSIS — F411 Generalized anxiety disorder: Secondary | ICD-10-CM

## 2020-01-12 MED ORDER — ALPRAZOLAM 0.5 MG PO TABS: 0.2500 mg | ORAL_TABLET | Freq: Three times a day (TID) | ORAL | 0 refills | Status: DC | PRN

## 2020-01-12 NOTE — Telephone Encounter (Signed)
I will refill but we will need uds, contract

## 2020-01-12 NOTE — Telephone Encounter (Signed)
Alprazolam refill.   Last OV: 10/10/2019 Last Fill: 10/10/2019 #90 and 0RF Pt sig: 1/2 to 1 tab tid prn UDS: None seen

## 2020-01-21 ENCOUNTER — Other Ambulatory Visit: Payer: Self-pay

## 2020-01-21 DIAGNOSIS — G4700 Insomnia, unspecified: Secondary | ICD-10-CM

## 2020-01-21 MED ORDER — TRAZODONE HCL 50 MG PO TABS: 25.0000 mg | ORAL_TABLET | Freq: Every evening | ORAL | 3 refills | Status: DC | PRN

## 2020-02-04 DIAGNOSIS — L700 Acne vulgaris: Secondary | ICD-10-CM | POA: Diagnosis not present

## 2020-02-04 DIAGNOSIS — L719 Rosacea, unspecified: Secondary | ICD-10-CM | POA: Diagnosis not present

## 2020-03-22 ENCOUNTER — Other Ambulatory Visit (HOSPITAL_BASED_OUTPATIENT_CLINIC_OR_DEPARTMENT_OTHER): Payer: Self-pay | Admitting: Family Medicine

## 2020-03-22 DIAGNOSIS — Z1231 Encounter for screening mammogram for malignant neoplasm of breast: Secondary | ICD-10-CM

## 2020-03-24 DIAGNOSIS — L719 Rosacea, unspecified: Secondary | ICD-10-CM | POA: Diagnosis not present

## 2020-04-04 ENCOUNTER — Other Ambulatory Visit: Payer: Self-pay | Admitting: Family Medicine

## 2020-04-04 DIAGNOSIS — F418 Other specified anxiety disorders: Secondary | ICD-10-CM

## 2020-04-26 ENCOUNTER — Encounter: Payer: Self-pay | Admitting: Family Medicine

## 2020-04-26 ENCOUNTER — Other Ambulatory Visit: Payer: Self-pay | Admitting: Family Medicine

## 2020-04-26 DIAGNOSIS — Z0000 Encounter for general adult medical examination without abnormal findings: Secondary | ICD-10-CM

## 2020-04-26 NOTE — Telephone Encounter (Signed)
done

## 2020-04-26 NOTE — Telephone Encounter (Signed)
Please place future lab orders.   

## 2020-05-04 ENCOUNTER — Other Ambulatory Visit (HOSPITAL_BASED_OUTPATIENT_CLINIC_OR_DEPARTMENT_OTHER): Payer: Self-pay | Admitting: Internal Medicine

## 2020-05-04 ENCOUNTER — Other Ambulatory Visit (INDEPENDENT_AMBULATORY_CARE_PROVIDER_SITE_OTHER): Payer: BC Managed Care – PPO

## 2020-05-04 ENCOUNTER — Encounter (HOSPITAL_BASED_OUTPATIENT_CLINIC_OR_DEPARTMENT_OTHER): Payer: Self-pay

## 2020-05-04 ENCOUNTER — Other Ambulatory Visit: Payer: Self-pay

## 2020-05-04 ENCOUNTER — Ambulatory Visit (HOSPITAL_BASED_OUTPATIENT_CLINIC_OR_DEPARTMENT_OTHER)
Admission: RE | Admit: 2020-05-04 | Discharge: 2020-05-04 | Disposition: A | Discharge status: Home / Self Care | Payer: BC Managed Care – PPO | Source: Ambulatory Visit | Attending: Family Medicine | Admitting: Family Medicine

## 2020-05-04 ENCOUNTER — Ambulatory Visit: Payer: BC Managed Care – PPO | Attending: Internal Medicine

## 2020-05-04 DIAGNOSIS — Z23 Encounter for immunization: Secondary | ICD-10-CM

## 2020-05-04 DIAGNOSIS — Z1231 Encounter for screening mammogram for malignant neoplasm of breast: Secondary | ICD-10-CM | POA: Diagnosis not present

## 2020-05-04 DIAGNOSIS — Z0000 Encounter for general adult medical examination without abnormal findings: Secondary | ICD-10-CM | POA: Diagnosis not present

## 2020-05-04 LAB — CBC WITH DIFFERENTIAL/PLATELET
Absolute Monocytes: 391 cells/uL (ref 200–950)
Basophils Absolute: 50 cells/uL (ref 0–200)
Basophils Relative: 1.2 %
Eosinophils Absolute: 71 cells/uL (ref 15–500)
Eosinophils Relative: 1.7 %
HCT: 36.1 % (ref 35.0–45.0)
Hemoglobin: 11.8 g/dL (ref 11.7–15.5)
Lymphs Abs: 1453 cells/uL (ref 850–3900)
MCH: 29 pg (ref 27.0–33.0)
MCHC: 32.7 g/dL (ref 32.0–36.0)
MCV: 88.7 fL (ref 80.0–100.0)
MPV: 12.4 fL (ref 7.5–12.5)
Monocytes Relative: 9.3 %
Neutro Abs: 2234 cells/uL (ref 1500–7800)
Neutrophils Relative %: 53.2 %
Platelets: 197 10*3/uL (ref 140–400)
RBC: 4.07 10*6/uL (ref 3.80–5.10)
RDW: 12.9 % (ref 11.0–15.0)
Total Lymphocyte: 34.6 %
WBC: 4.2 10*3/uL (ref 3.8–10.8)

## 2020-05-04 LAB — COMPREHENSIVE METABOLIC PANEL WITH GFR
AG Ratio: 2 (calc) (ref 1.0–2.5)
ALT: 11 U/L (ref 6–29)
AST: 14 U/L (ref 10–35)
Albumin: 4.2 g/dL (ref 3.6–5.1)
Alkaline phosphatase (APISO): 64 U/L (ref 37–153)
BUN: 11 mg/dL (ref 7–25)
CO2: 29 mmol/L (ref 20–32)
Calcium: 9.1 mg/dL (ref 8.6–10.4)
Chloride: 105 mmol/L (ref 98–110)
Creat: 0.75 mg/dL (ref 0.50–1.05)
Globulin: 2.1 g/dL (ref 1.9–3.7)
Glucose, Bld: 85 mg/dL (ref 65–99)
Potassium: 4.3 mmol/L (ref 3.5–5.3)
Sodium: 139 mmol/L (ref 135–146)
Total Bilirubin: 0.4 mg/dL (ref 0.2–1.2)
Total Protein: 6.3 g/dL (ref 6.1–8.1)

## 2020-05-04 LAB — TSH: TSH: 1.51 mIU/L

## 2020-05-04 LAB — LIPID PANEL
Cholesterol: 202 mg/dL — ABNORMAL HIGH (ref <200)
HDL: 71 mg/dL (ref >=50)
LDL Cholesterol (Calc): 115 mg/dL (calc) — ABNORMAL HIGH
Non-HDL Cholesterol (Calc): 131 mg/dL (calc) — ABNORMAL HIGH (ref <130)
Total CHOL/HDL Ratio: 2.8 (calc) (ref <5.0)
Triglycerides: 69 mg/dL (ref <150)

## 2020-05-04 NOTE — Progress Notes (Signed)
   Covid-19 Vaccination Clinic  Name:  Katelyn Bailey    MRN: [id number] DOB: 1965/12/18  05/04/2020  [name] was observed post Covid-19 immunization for 15 minutes without incident. She was provided with Vaccine Information Sheet and instruction to access the V-Safe system.  Vaccinated by [facility]  [name] was instructed to call 911 with any severe reactions post vaccine: Marland Kitchen Difficulty breathing  . Swelling of face and throat  . A fast heartbeat  . A bad rash all over body  . Dizziness and weakness

## 2020-05-04 NOTE — Addendum Note (Signed)
Addended by: Kelle Darting A on: 05/04/2020 08:01 AM   Modules accepted: Orders

## 2020-05-06 ENCOUNTER — Encounter: Payer: Self-pay | Admitting: Family Medicine

## 2020-05-06 ENCOUNTER — Ambulatory Visit (HOSPITAL_BASED_OUTPATIENT_CLINIC_OR_DEPARTMENT_OTHER): Payer: BC Managed Care – PPO

## 2020-05-06 ENCOUNTER — Other Ambulatory Visit: Payer: Self-pay

## 2020-05-06 ENCOUNTER — Ambulatory Visit (INDEPENDENT_AMBULATORY_CARE_PROVIDER_SITE_OTHER): Payer: BC Managed Care – PPO | Admitting: Family Medicine

## 2020-05-06 VITALS — BP 120/86 | HR 67 | Temp 98.1°F | Resp 12 | Ht 60.5 in | Wt 139.8 lb

## 2020-05-06 DIAGNOSIS — Z23 Encounter for immunization: Secondary | ICD-10-CM | POA: Diagnosis not present

## 2020-05-06 DIAGNOSIS — Z79899 Other long term (current) drug therapy: Secondary | ICD-10-CM

## 2020-05-06 DIAGNOSIS — E559 Vitamin D deficiency, unspecified: Secondary | ICD-10-CM

## 2020-05-06 DIAGNOSIS — I10 Essential (primary) hypertension: Secondary | ICD-10-CM

## 2020-05-06 DIAGNOSIS — F411 Generalized anxiety disorder: Secondary | ICD-10-CM

## 2020-05-06 DIAGNOSIS — F418 Other specified anxiety disorders: Secondary | ICD-10-CM | POA: Diagnosis not present

## 2020-05-06 DIAGNOSIS — Z0000 Encounter for general adult medical examination without abnormal findings: Secondary | ICD-10-CM

## 2020-05-06 MED ORDER — ALPRAZOLAM 0.5 MG PO TABS: 0.2500 mg | ORAL_TABLET | Freq: Three times a day (TID) | ORAL | 0 refills | Status: DC | PRN

## 2020-05-06 MED ORDER — BUPROPION HCL ER (XL) 300 MG PO TB24: 300.0000 mg | ORAL_TABLET | Freq: Every day | ORAL | 3 refills | Status: DC

## 2020-05-06 MED ORDER — LISINOPRIL 10 MG PO TABS: 10.0000 mg | ORAL_TABLET | Freq: Every day | ORAL | 1 refills | Status: DC

## 2020-05-06 NOTE — Patient Instructions (Signed)

## 2020-05-06 NOTE — Progress Notes (Signed)
Subjective:     Katelyn Bailey is a 54 y.o. female and is here for a comprehensive physical exam. The patient reports no problems.  Her husband passed away from covid   Social History   Socioeconomic History  . Marital status: Married    Spouse name: Gwyndolyn Saxon  . Number of children: 2  . Years of education: Not on file  . Highest education level: Not on file  Occupational History  . Occupation: plant sanitation    Comment: [employer]  Tobacco Use  . Smoking status: Never Smoker  . Smokeless tobacco: Never Used  Vaping Use  . Vaping Use: Never used  Substance and Sexual Activity  . Alcohol use: No  . Drug use: No  . Sexual activity: Yes    Partners: Male  Other Topics Concern  . Not on file  Social History Narrative   Married x 18 years   2 sons--one son died suddenly in October 26, 2006 with no known cause   Social Determinants of Health   Financial Resource Strain:   . Difficulty of Paying Living Expenses: Not on file  Food Insecurity:   . Worried About Charity fundraiser in the Last Year: Not on file  . Ran Out of Food in the Last Year: Not on file  Transportation Needs:   . Lack of Transportation (Medical): Not on file  . Lack of Transportation (Non-Medical): Not on file  Physical Activity:   . Days of Exercise per Week: Not on file  . Minutes of Exercise per Session: Not on file  Stress:   . Feeling of Stress : Not on file  Social Connections:   . Frequency of Communication with Friends and Family: Not on file  . Frequency of Social Gatherings with Friends and Family: Not on file  . Attends Religious Services: Not on file  . Active Member of Clubs or Organizations: Not on file  . Attends Archivist Meetings: Not on file  . Marital Status: Not on file  Intimate Partner Violence:   . Fear of Current or Ex-Partner: Not on file  . Emotionally Abused: Not on file  . Physically Abused: Not on file  . Sexually Abused: Not on file   Health Maintenance  Topic Date  Due  . Hepatitis C Screening  Never done  . INFLUENZA VACCINE  02/29/2020  . HIV Screening  04/04/2024 (Originally [birth year]1983)  . COVID-19 Vaccine (2 - Pfizer 2-dose series) 05/25/2020  . MAMMOGRAM  05/04/2021  . TETANUS/TDAP  01/04/2022  . COLONOSCOPY  06/16/2029    The following portions of the patient's history were reviewed and updated as appropriate:  She  has a past medical history of Acid reflux disease, Anxiety, Cancer ([place]), Complication of anesthesia, Dizziness, Essential hypertension, Headache(784.0), Hiatal hernia, Hypercholesterolemia, Kidney stone, Neuralgia, Occipital neuralgia, and Overweight(278.02). She does not have any pertinent problems on file. 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 Her family history includes Cancer in her mother; Heart disease in her father and paternal grandfather. She  reports that she has never smoked. She has never used smokeless tobacco. She reports that she does not drink alcohol and does not use drugs. She has a current medication list which includes the following prescription(s): alprazolam, bupropion, calcium citrate-vitamin d, doxycycline, lisinopril, metronidazole, metronidazole (topical), multivitamin with minerals, OVER THE COUNTER MEDICATION, trazodone, vitamin d (ergocalciferol), and [DISCONTINUED] topiramate,  and the following Facility-Administered Medications: sodium chloride. Current Outpatient Medications on File Prior to Visit  Medication Sig Dispense Refill  . ALPRAZolam (XANAX) 0.5 MG tablet Take 0.5-1 tablets (0.25-0.5 mg total) by mouth 3 (three) times daily as needed. 90 tablet 0  . buPROPion (WELLBUTRIN XL) 300 MG 24 hr tablet TAKE 1 TABLET DAILY 90 tablet 3  . CALCIUM CITRATE-VITAMIN D PO Take 1 tablet by mouth 3 (three) times daily.    Marland Kitchen doxycycline (PERIOSTAT) 20 MG  tablet Take 20 mg by mouth 2 (two) times daily.    Marland Kitchen lisinopril (ZESTRIL) 10 MG tablet TAKE 1 TABLET DAILY 90 tablet 1  . metronidazole (NORITATE) 1 % cream Apply topically daily. 60 g 0  . METRONIDAZOLE, TOPICAL, 0.75 % LOTN Apply a small amount to the affected area twice daily. 59 mL 0  . Multiple Vitamin (MULTIVITAMIN WITH MINERALS) TABS Take 1 tablet by mouth 2 (two) times daily.    Marland Kitchen OVER THE COUNTER MEDICATION     . traZODone (DESYREL) 50 MG tablet Take 0.5-1 tablets (25-50 mg total) by mouth at bedtime as needed for sleep. 30 tablet 3  . Vitamin D, Ergocalciferol, (DRISDOL) 50000 units CAPS capsule TAKE 1 CAPSULE BY MOUTH EVERY 7 DAYS 12 capsule 2  . [DISCONTINUED] topiramate (TOPAMAX) 25 MG capsule Take one tablet by mouth at bedtime x 1 week then 1 po bid thereafter. 60 capsule 5   Current Facility-Administered Medications on File Prior to Visit  Medication Dose Route Frequency Provider Last Rate Last Admin  . 0.9 %  sodium chloride infusion  500 mL Intravenous Once Jackquline Denmark, MD       She has No Known Allergies..  Review of Systems Review of Systems  Constitutional: Negative for activity change, appetite change and fatigue.  HENT: Negative for hearing loss, congestion, tinnitus and ear discharge.  dentist q51m Eyes: Negative for visual disturbance (see optho q1y -- vision corrected to 20/20 with glasses).  Respiratory: Negative for cough, chest tightness and shortness of breath.   Cardiovascular: Negative for chest pain, palpitations and leg swelling.  Gastrointestinal: Negative for abdominal pain, diarrhea, constipation and abdominal distention.  Genitourinary: Negative for urgency, frequency, decreased urine volume and difficulty urinating.  Musculoskeletal: Negative for back pain, arthralgias and gait problem.  Skin: Negative for color change, pallor and rash.  Neurological: Negative for dizziness, light-headedness, numbness and headaches.  Hematological: Negative for  adenopathy. Does not bruise/bleed easily.  Psychiatric/Behavioral: Negative for suicidal ideas, confusion, sleep disturbance, self-injury, dysphoric mood, decreased concentration and agitation.       Objective:    BP 120/86 (BP Location: Left Arm, Cuff Size: Normal)   Pulse 67   Temp 98.1 F (36.7 C) (Oral)   Resp 12   Ht 5' 0.5" (1.537 m)   Wt 139 lb 12.8 oz (63.4 kg)   LMP 11/08/2010   SpO2 99%   BMI 26.85 kg/m  General appearance: alert, cooperative, appears stated age and no distress Head: Normocephalic, without obvious abnormality, atraumatic Eyes: negative findings: lids and lashes normal, conjunctivae and sclerae normal and pupils equal, round, reactive to light and accomodation Ears: normal TM's and external ear canals both ears Neck: no adenopathy, no carotid bruit, no JVD, supple, symmetrical, trachea midline and thyroid not enlarged, symmetric, no tenderness/mass/nodules Back: symmetric, no curvature. ROM normal. No CVA tenderness. Lungs: clear to auscultation bilaterally Breasts: normal appearance, no masses or tenderness Heart: regular rate and rhythm, S1, S2 normal, no murmur, click, rub or gallop Abdomen:  soft, non-tender; bowel sounds normal; no masses,  no organomegaly Pelvic: not indicated; status post hysterectomy, negative ROS Extremities: extremities normal, atraumatic, no cyanosis or edema Pulses: 2+ and symmetric Skin: Skin color, texture, turgor normal. No rashes or lesions Lymph nodes: Cervical, supraclavicular, and axillary nodes normal. Neurologic: Alert and oriented X 3, normal strength and tone. Normal symmetric reflexes. Normal coordination and gait    Assessment:    Healthy female exam.      Plan:    ghm utd Check labs  See After Visit Summary for Counseling Recommendations    1. Preventative health care See above  Labs reviewed with pt   2. Primary hypertension Well controlled, no changes to meds. Encouraged heart healthy diet such  as the DASH diet and exercise as tolerated.  con't lisinopril   3. Vitamin D deficiency con't vita d   4. Generalized anxiety disorder Stable with prn xanax  - ALPRAZolam (XANAX) 0.5 MG tablet; Take 0.5-1 tablets (0.25-0.5 mg total) by mouth 3 (three) times daily as needed.  Dispense: 90 tablet; Refill: 0  5. Depression with anxiety con't wellbutrin and prn xanax  - buPROPion (WELLBUTRIN XL) 300 MG 24 hr tablet; Take 1 tablet (300 mg total) by mouth daily.  Dispense: 90 tablet; Refill: 3 - DRUG MONITORING, PANEL 8 WITH CONFIRMATION, URINE  6. Essential hypertension Well controlled, no changes to meds. Encouraged heart healthy diet such as the DASH diet and exercise as tolerated.   - lisinopril (ZESTRIL) 10 MG tablet; Take 1 tablet (10 mg total) by mouth daily.  Dispense: 90 tablet; Refill: 1  7. High risk medication use   - DRUG MONITORING, PANEL 8 WITH CONFIRMATION, URINE  8. Influenza vaccine administered   - Flu Vaccine QUAD 36+ mos IM (Fluarix & Fluzone Quad PF

## 2020-05-07 LAB — DM TEMPLATE

## 2020-05-07 LAB — DRUG MONITORING, PANEL 8 WITH CONFIRMATION, URINE
6 Acetylmorphine: NEGATIVE ng/mL (ref <10)
Alcohol Metabolites: NEGATIVE ng/mL
Amphetamines: NEGATIVE ng/mL (ref <500)
Benzodiazepines: NEGATIVE ng/mL (ref <100)
Buprenorphine, Urine: NEGATIVE ng/mL (ref <5)
Cocaine Metabolite: NEGATIVE ng/mL (ref <150)
Creatinine: 34.7 mg/dL
MDMA: NEGATIVE ng/mL (ref <500)
Marijuana Metabolite: NEGATIVE ng/mL (ref <20)
Opiates: NEGATIVE ng/mL (ref <100)
Oxidant: NEGATIVE ug/mL
Oxycodone: NEGATIVE ng/mL (ref <100)
pH: 7.7 (ref 4.5–9.0)

## 2020-07-04 ENCOUNTER — Other Ambulatory Visit: Payer: Self-pay | Admitting: Family Medicine

## 2020-07-04 DIAGNOSIS — F411 Generalized anxiety disorder: Secondary | ICD-10-CM

## 2020-07-05 NOTE — Telephone Encounter (Signed)
Requesting: alprazolam (Xanax) 0.5mg  Contract: 05/06/2020 UDS: 05/06/2020 Last Visit: 05/06/2020 Next Visit: 05/09/2021 Last Refill: 05/06/2020 #90 and 0RF Pt sig: 1/2 to 1 tab tid prn  Please Advise

## 2020-07-08 DIAGNOSIS — L719 Rosacea, unspecified: Secondary | ICD-10-CM | POA: Diagnosis not present

## 2020-08-03 ENCOUNTER — Other Ambulatory Visit: Payer: Self-pay | Admitting: Family Medicine

## 2020-08-03 DIAGNOSIS — G4700 Insomnia, unspecified: Secondary | ICD-10-CM

## 2020-09-04 ENCOUNTER — Other Ambulatory Visit: Payer: Self-pay | Admitting: Family Medicine

## 2020-09-04 DIAGNOSIS — F411 Generalized anxiety disorder: Secondary | ICD-10-CM

## 2020-09-04 DIAGNOSIS — I10 Essential (primary) hypertension: Secondary | ICD-10-CM

## 2020-09-06 NOTE — Telephone Encounter (Signed)
Requesting: alprazolam 0.5mg  Contract: 05/06/2020 UDS: 05/06/2020 Last Visit: 05/06/2020 Next Visit: 05/09/2021 Last Refill: 07/05/2020 #90 and 0RF Pt sig: 1/2 to 1 tab tid prn  Please Advise

## 2020-10-07 ENCOUNTER — Ambulatory Visit (INDEPENDENT_AMBULATORY_CARE_PROVIDER_SITE_OTHER): Payer: BC Managed Care – PPO | Admitting: Family Medicine

## 2020-10-07 ENCOUNTER — Other Ambulatory Visit: Payer: Self-pay

## 2020-10-07 ENCOUNTER — Encounter: Payer: Self-pay | Admitting: Family Medicine

## 2020-10-07 VITALS — BP 122/84 | HR 72 | Temp 98.4°F | Resp 18 | Wt 147.8 lb

## 2020-10-07 DIAGNOSIS — M2540 Effusion, unspecified joint: Secondary | ICD-10-CM

## 2020-10-07 DIAGNOSIS — F418 Other specified anxiety disorders: Secondary | ICD-10-CM

## 2020-10-07 DIAGNOSIS — F4321 Adjustment disorder with depressed mood: Secondary | ICD-10-CM | POA: Diagnosis not present

## 2020-10-07 DIAGNOSIS — R41840 Attention and concentration deficit: Secondary | ICD-10-CM

## 2020-10-07 LAB — POC URINALSYSI DIPSTICK (AUTOMATED)
Bilirubin, UA: NEGATIVE
Blood, UA: NEGATIVE
Glucose, UA: NEGATIVE
Ketones, UA: NEGATIVE
Nitrite, UA: NEGATIVE
Protein, UA: NEGATIVE
Spec Grav, UA: 1.015 (ref 1.010–1.025)
Urobilinogen, UA: 0.2 E.U./dL
pH, UA: 6.5 (ref 5.0–8.0)

## 2020-10-07 MED ORDER — SERTRALINE HCL 50 MG PO TABS: 50.0000 mg | ORAL_TABLET | Freq: Every day | ORAL | 3 refills | Status: DC

## 2020-10-07 NOTE — Patient Instructions (Signed)

## 2020-10-07 NOTE — Progress Notes (Signed)
Patient ID: Katelyn Bailey, female    DOB: May 16, 1966  Age: 55 y.o. MRN: [id number]    Subjective:  Subjective  HPI LISAANNE LAWRIE presents for swelling and joint pains x >1 month   Tylenol arthritis helps   No other complaints  Review of Systems  Constitutional: Negative for appetite change, diaphoresis, fatigue and unexpected weight change.  Eyes: Negative for pain, redness and visual disturbance.  Respiratory: Negative for cough, chest tightness, shortness of breath and wheezing.   Cardiovascular: Negative for chest pain, palpitations and leg swelling.  Endocrine: Negative for cold intolerance, heat intolerance, polydipsia, polyphagia and polyuria.  Genitourinary: Negative for difficulty urinating, dysuria and frequency.  Neurological: Negative for dizziness, light-headedness, numbness and headaches.    History Past Medical History:  Diagnosis Date  . Acid reflux disease    resolved with wt loss  . Anxiety   . Cancer ([place])    SKIN CA  . Complication of anesthesia    headaches postop  . Dizziness   . Essential hypertension   . Headache(784.0)   . Hiatal hernia    resolved after gastric bypass  . Hypercholesterolemia   . Kidney stone   . Neuralgia    occipital  . Occipital neuralgia   . 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   Her family history includes Arthritis in her mother; Cancer in her mother; Diabetes in her brother; Heart disease in her father and paternal grandfather.She reports that she has never smoked. She has never used smokeless tobacco. She reports that she does not drink alcohol and does not use drugs.  Current Outpatient Medications on File Prior to Visit  Medication Sig Dispense Refill  . ALPRAZolam (XANAX) 0.5 MG tablet TAKE 1/2 TO 1 TABLET       (0.25-0.5MG  TOTAL) 3  TIMES A DAY AS NEEDED 90 tablet 0  . buPROPion (WELLBUTRIN XL) 300 MG 24 hr tablet Take 1 tablet (300 mg total) by mouth daily. 90 tablet 3  . CALCIUM CITRATE-VITAMIN D PO Take 1 tablet by mouth 3 (three) times daily.    Marland Kitchen doxycycline (PERIOSTAT) 20 MG tablet Take 20 mg by mouth 2 (two) times daily.    Marland Kitchen lisinopril (ZESTRIL) 10 MG tablet Take 1 tablet (10 mg total) by mouth daily. 90 tablet 2  . metronidazole (NORITATE) 1 % cream Apply topically daily. 60 g 0  . METRONIDAZOLE, TOPICAL, 0.75 % LOTN Apply a small amount to the affected area twice daily. 59 mL 0  . Multiple Vitamin (MULTIVITAMIN WITH MINERALS) TABS Take 1 tablet by mouth 2 (two) times daily.    Marland Kitchen OVER THE COUNTER MEDICATION     . Vitamin D, Ergocalciferol, (DRISDOL) 50000 units CAPS capsule TAKE 1 CAPSULE BY MOUTH EVERY 7 DAYS 12 capsule 2  . [DISCONTINUED] topiramate (TOPAMAX) 25 MG capsule Take one tablet by mouth at bedtime x 1 week then 1 po bid thereafter. 60 capsule 5   Current Facility-Administered Medications on File Prior to Visit  Medication Dose Route Frequency Provider Last Rate Last Admin  . 0.9 %  sodium chloride infusion  500 mL Intravenous Once Jackquline Denmark, MD         Objective:  Objective  Physical Exam Vitals and nursing note reviewed.  Constitutional:      Appearance: She is well-developed.  HENT:     Head: Normocephalic  and atraumatic.  Eyes:     Conjunctiva/sclera: Conjunctivae normal.  Neck:     Thyroid: No thyromegaly.     Vascular: No carotid bruit or JVD.  Cardiovascular:     Rate and Rhythm: Normal rate and regular rhythm.     Heart sounds: Normal heart sounds. No murmur heard.   Pulmonary:     Effort: Pulmonary effort is normal. No respiratory distress.     Breath sounds: Normal breath sounds. No wheezing or rales.  Chest:     Chest wall: No tenderness.  Musculoskeletal:     Cervical back: Normal range of motion and neck supple.  Neurological:     Mental Status: She is alert and  oriented to person, place, and time.    BP 122/84 (BP Location: Left Arm, Patient Position: Sitting, Cuff Size: Normal)   Pulse 72   Temp 98.4 F (36.9 C) (Oral)   Resp 18   Wt 147 lb 12.8 oz (67 kg)   LMP 11/08/2010   SpO2 100%   BMI 28.39 kg/m  Wt Readings from Last 3 Encounters:  10/07/20 147 lb 12.8 oz (67 kg)  05/06/20 139 lb 12.8 oz (63.4 kg)  10/10/19 144 lb 9.6 oz (65.6 kg)     Lab Results  Component Value Date   WBC 4.5 10/07/2020   HGB 11.4 (L) 10/07/2020   HCT 35.5 (L) 10/07/2020   PLT 211.0 10/07/2020   GLUCOSE 85 05/04/2020   CHOL 202 (H) 05/04/2020   TRIG 69 05/04/2020   HDL 71 05/04/2020   LDLCALC 115 (H) 05/04/2020   ALT 11 05/04/2020   AST 14 05/04/2020   NA 139 05/04/2020   K 4.3 05/04/2020   CL 105 05/04/2020   CREATININE 0.75 05/04/2020   BUN 11 05/04/2020   CO2 29 05/04/2020   TSH 0.92 10/07/2020   INR 1.12 03/29/2012    MM 3D SCREEN BREAST BILATERAL  Result Date: 05/05/2020 CLINICAL DATA:  Screening. EXAM: DIGITAL SCREENING BILATERAL MAMMOGRAM WITH TOMO AND CAD COMPARISON:  Previous exam(s). ACR Breast Density Category b: There are scattered areas of fibroglandular density. FINDINGS: There are no findings suspicious for malignancy. Images were processed with CAD. IMPRESSION: No mammographic evidence of malignancy. A result letter of this screening mammogram will be mailed directly to the patient. RECOMMENDATION: Screening mammogram in one year. (Code:SM-B-01Y) BI-RADS CATEGORY  1: Negative. Electronically Signed   By: Evangeline Dakin M.D.   On: 05/05/2020 16:30     Assessment & Plan:  Plan  I have discontinued Gidget C. Honeycutt's traZODone. I am also having her start on sertraline. Additionally, I am having her maintain her multivitamin with minerals, CALCIUM CITRATE-VITAMIN D PO, Vitamin D (Ergocalciferol), OVER THE COUNTER MEDICATION, metronidazole, METRONIDAZOLE (TOPICAL), doxycycline, buPROPion, lisinopril, and ALPRAZolam. We will continue to  administer sodium chloride.  Meds ordered this encounter  Medications  . sertraline (ZOLOFT) 50 MG tablet    Sig: Take 1 tablet (50 mg total) by mouth daily.    Dispense:  30 tablet    Refill:  3    Problem List Items Addressed This Visit      Unprioritized   Depression with anxiety    Cont zoloft and wellbutrin  stable      Relevant Medications   sertraline (ZOLOFT) 50 MG tablet   Other Relevant Orders   Ambulatory referral to Psychology   Grief reaction   Relevant Medications   sertraline (ZOLOFT) 50 MG tablet   Other Relevant Orders   Ambulatory  referral to Psychology   Joint swelling - Primary    Check labs       Relevant Orders   CBC with Differential/Platelet (Completed)   Thyroid Panel With TSH (Completed)   Sedimentation rate (Completed)   Antinuclear Antib (ANA)   Rheumatoid Factor (Completed)   POCT Urinalysis Dipstick (Automated) (Completed)   Poor concentration   Relevant Orders   Ambulatory referral to Psychology      Follow-up: Return in about 4 weeks (around 11/04/2020), or if symptoms worsen or fail to improve, for anxiety.  Ann Held, DO

## 2020-10-08 DIAGNOSIS — F418 Other specified anxiety disorders: Secondary | ICD-10-CM | POA: Insufficient documentation

## 2020-10-08 DIAGNOSIS — F4321 Adjustment disorder with depressed mood: Secondary | ICD-10-CM | POA: Insufficient documentation

## 2020-10-08 DIAGNOSIS — R41840 Attention and concentration deficit: Secondary | ICD-10-CM | POA: Insufficient documentation

## 2020-10-08 DIAGNOSIS — M2540 Effusion, unspecified joint: Secondary | ICD-10-CM | POA: Insufficient documentation

## 2020-10-08 LAB — CBC WITH DIFFERENTIAL/PLATELET
Basophils Absolute: 0 10*3/uL (ref 0.0–0.1)
Basophils Relative: 0.4 % (ref 0.0–3.0)
Eosinophils Absolute: 0.1 10*3/uL (ref 0.0–0.7)
Eosinophils Relative: 1.6 % (ref 0.0–5.0)
HCT: 35.5 % — ABNORMAL LOW (ref 36.0–46.0)
Hemoglobin: 11.4 g/dL — ABNORMAL LOW (ref 12.0–15.0)
Lymphocytes Relative: 37.2 % (ref 12.0–46.0)
Lymphs Abs: 1.7 10*3/uL (ref 0.7–4.0)
MCHC: 32.3 g/dL (ref 30.0–36.0)
MCV: 85.6 fl (ref 78.0–100.0)
Monocytes Absolute: 0.4 10*3/uL (ref 0.1–1.0)
Monocytes Relative: 8.5 % (ref 3.0–12.0)
Neutro Abs: 2.4 10*3/uL (ref 1.4–7.7)
Neutrophils Relative %: 52.3 % (ref 43.0–77.0)
Platelets: 211 10*3/uL (ref 150.0–400.0)
RBC: 4.14 Mil/uL (ref 3.87–5.11)
RDW: 13.4 % (ref 11.5–15.5)
WBC: 4.5 10*3/uL (ref 4.0–10.5)

## 2020-10-08 LAB — SEDIMENTATION RATE: Sed Rate: 5 mm/hr (ref 0–30)

## 2020-10-08 NOTE — Assessment & Plan Note (Addendum)
Cont zoloft and wellbutrin  stable

## 2020-10-08 NOTE — Assessment & Plan Note (Signed)
Check labs 

## 2020-10-09 LAB — ANA: Anti Nuclear Antibody (ANA): NEGATIVE

## 2020-10-09 LAB — RHEUMATOID FACTOR: Rheumatoid fact SerPl-aCnc: <14 IU/mL (ref <14)

## 2020-10-09 LAB — THYROID PANEL WITH TSH
Free Thyroxine Index: 2.2 (ref 1.4–3.8)
T3 Uptake: 30 % (ref 22–35)
T4, Total: 7.3 ug/dL (ref 5.1–11.9)
TSH: 0.92 mIU/L

## 2020-10-18 ENCOUNTER — Ambulatory Visit (INDEPENDENT_AMBULATORY_CARE_PROVIDER_SITE_OTHER): Payer: BC Managed Care – PPO | Admitting: Clinical

## 2020-10-18 DIAGNOSIS — F4323 Adjustment disorder with mixed anxiety and depressed mood: Secondary | ICD-10-CM

## 2020-10-31 ENCOUNTER — Other Ambulatory Visit: Payer: Self-pay | Admitting: Family Medicine

## 2020-10-31 DIAGNOSIS — F418 Other specified anxiety disorders: Secondary | ICD-10-CM

## 2020-10-31 DIAGNOSIS — F4321 Adjustment disorder with depressed mood: Secondary | ICD-10-CM

## 2020-11-05 ENCOUNTER — Ambulatory Visit (INDEPENDENT_AMBULATORY_CARE_PROVIDER_SITE_OTHER): Payer: BC Managed Care – PPO | Admitting: Clinical

## 2020-11-05 DIAGNOSIS — F4323 Adjustment disorder with mixed anxiety and depressed mood: Secondary | ICD-10-CM | POA: Diagnosis not present

## 2020-11-11 ENCOUNTER — Ambulatory Visit (INDEPENDENT_AMBULATORY_CARE_PROVIDER_SITE_OTHER): Payer: BC Managed Care – PPO | Admitting: Family Medicine

## 2020-11-11 ENCOUNTER — Other Ambulatory Visit: Payer: Self-pay

## 2020-11-11 ENCOUNTER — Encounter: Payer: Self-pay | Admitting: Family Medicine

## 2020-11-11 VITALS — BP 123/77 | HR 66 | Temp 98.2°F | Ht 60.0 in | Wt 148.8 lb

## 2020-11-11 DIAGNOSIS — F4321 Adjustment disorder with depressed mood: Secondary | ICD-10-CM | POA: Diagnosis not present

## 2020-11-11 DIAGNOSIS — F419 Anxiety disorder, unspecified: Secondary | ICD-10-CM | POA: Diagnosis not present

## 2020-11-11 DIAGNOSIS — F32A Depression, unspecified: Secondary | ICD-10-CM

## 2020-11-11 MED ORDER — SERTRALINE HCL 100 MG PO TABS: 100.0000 mg | ORAL_TABLET | Freq: Every day | ORAL | 3 refills | Status: DC

## 2020-11-11 NOTE — Progress Notes (Signed)
Subjective:   By signing my name below, I, [name], attest that this documentation has been prepared under the direction and in the presence of Dr. Roma Schanz DO, 11/11/20   Patient ID: Katelyn Bailey, female    DOB: [birth year], 55 y.o.   MRN: [id number]  Chief Complaint  Patient presents with  . Follow-up    HPI Patient is in today for a office visit. She reports she is feeling much better than her last visit. She tolerating her 300mg  of Wellbutrin PO daily and  0.5 mg of alprazolam PO daily dosage. However she is requesting for the dosages to be increased. She has started counseling and plans to continue going. She denies having any abdominal pain, chest pain, N/V/D, shortness of breath, back pain or headaches. She has no new complaints at this time.    Past Medical History:  Diagnosis Date  . Acid reflux disease    resolved with wt loss  . Anxiety   . Cancer ([place])    SKIN CA  . Complication of anesthesia    headaches postop  . Dizziness   . Essential hypertension   . Headache(784.0)   . Hiatal hernia    resolved after gastric bypass  . Hypercholesterolemia   . Kidney stone   . Neuralgia    occipital  . Occipital neuralgia   . Overweight(278.02)     Past Surgical History:  Procedure Laterality Date  . ABDOMINAL HYSTERECTOMY  04/09/2012   Procedure: HYSTERECTOMY ABDOMINAL;  Surgeon: Gus Height, MD;  Location: [facility];  Service: Gynecology;  Laterality: N/A;  . [place]  . ROUX-EN-Y GASTRIC BYPASS  03/20/2011  . skin carcinoma  06/21/2012   removed from upper lip  . TUBAL LIGATION  1997    Family History  Problem Relation Age of Onset  . Heart disease Father   . Cancer Mother        breast  . Arthritis Mother   . Diabetes Brother   . Heart disease Paternal Grandfather   . Colon cancer Neg Hx   . Colon polyps Neg Hx   . Esophageal cancer Neg Hx   . Rectal cancer Neg Hx   . Stomach cancer Neg Hx     Social History    Socioeconomic History  . Marital status: Married    Spouse name: Gwyndolyn Saxon  . Number of children: 2  . Years of education: Not on file  . Highest education level: Not on file  Occupational History  . Occupation: plant sanitation    Comment: [employer]  Tobacco Use  . Smoking status: Never Smoker  . Smokeless tobacco: Never Used  Vaping Use  . Vaping Use: Never used  Substance and Sexual Activity  . Alcohol use: No  . Drug use: No  . Sexual activity: Yes    Partners: Male  Other Topics Concern  . Not on file  Social History Narrative   Married x 18 years   2 sons--one son died suddenly in 11-08-06 with no known cause   Husband passed away last year    Social Determinants of Health   Financial Resource Strain: Not on file  Food Insecurity: Not on file  Transportation Needs: Not on file  Physical Activity: Not on file  Stress: Not on file  Social Connections: Not on file  Intimate Partner Violence: Not on file    Outpatient Medications Prior to Visit  Medication Sig Dispense Refill  . ALPRAZolam (XANAX) 0.5  MG tablet TAKE 1/2 TO 1 TABLET       (0.25-0.5MG  TOTAL) 3 TIMES A DAY AS NEEDED 90 tablet 0  . buPROPion (WELLBUTRIN XL) 300 MG 24 hr tablet Take 1 tablet (300 mg total) by mouth daily. 90 tablet 3  . CALCIUM CITRATE-VITAMIN D PO Take 1 tablet by mouth 3 (three) times daily.    Marland Kitchen COVID-19 mRNA vaccine, Pfizer, 30 MCG/0.3ML injection AS DIRECTED .3 mL 0  . lisinopril (ZESTRIL) 10 MG tablet Take 1 tablet (10 mg total) by mouth daily. 90 tablet 2  . metronidazole (NORITATE) 1 % cream Apply topically daily. 60 g 0  . METRONIDAZOLE, TOPICAL, 0.75 % LOTN Apply a small amount to the affected area twice daily. 59 mL 0  . Multiple Vitamin (MULTIVITAMIN WITH MINERALS) TABS Take 1 tablet by mouth 2 (two) times daily.    Marland Kitchen OVER THE COUNTER MEDICATION     . Vitamin D, Ergocalciferol, (DRISDOL) 50000 units CAPS capsule TAKE 1 CAPSULE BY MOUTH EVERY 7 DAYS 12 capsule 2  . sertraline  (ZOLOFT) 50 MG tablet Take 1 tablet (50 mg total) by mouth daily. 90 tablet 1  . doxycycline (PERIOSTAT) 20 MG tablet Take 20 mg by mouth 2 (two) times daily. (Patient not taking: Reported on 11/11/2020)     Facility-Administered Medications Prior to Visit  Medication Dose Route Frequency Provider Last Rate Last Admin  . 0.9 %  sodium chloride infusion  500 mL Intravenous Once Jackquline Denmark, MD        No Known Allergies  Review of Systems  Constitutional: Negative for chills and fever.  HENT: Negative for congestion, ear pain, sinus pain and sore throat.   Eyes: Negative for pain.  Respiratory: Negative for cough, shortness of breath and wheezing.   Cardiovascular: Negative for chest pain, palpitations and leg swelling.  Gastrointestinal: Negative for abdominal pain, blood in stool, diarrhea, nausea and vomiting.  Genitourinary: Negative for dysuria, frequency, hematuria and urgency.  Skin: Negative for rash.  Neurological: Negative for dizziness and headaches.      Objective:    Physical Exam Constitutional:      General: She is not in acute distress.    Appearance: Normal appearance. She is not ill-appearing.  HENT:     Head: Normocephalic and atraumatic.     Right Ear: External ear normal.     Left Ear: External ear normal.  Eyes:     Extraocular Movements: Extraocular movements intact.     Pupils: Pupils are equal, round, and reactive to light.  Cardiovascular:     Rate and Rhythm: Normal rate and regular rhythm.     Pulses: Normal pulses.     Heart sounds: Normal heart sounds. No murmur heard.   Pulmonary:     Effort: Pulmonary effort is normal.     Breath sounds: Normal breath sounds.  Neurological:     Mental Status: She is alert and oriented to person, place, and time.  Psychiatric:        Behavior: Behavior normal.     BP 123/77 (BP Location: Left Arm, Patient Position: Sitting, Cuff Size: Large)   Pulse 66   Temp 98.2 F (36.8 C) (Temporal)   Ht 5'  (1.524 m)   Wt 148 lb 12.8 oz (67.5 kg)   LMP 11/08/2010   SpO2 98%   BMI 29.06 kg/m  Wt Readings from Last 3 Encounters:  11/11/20 148 lb 12.8 oz (67.5 kg)  10/07/20 147 lb 12.8 oz (67 kg)  05/06/20 139 lb 12.8 oz (63.4 kg)    Diabetic Foot Exam - Simple   No data filed    Lab Results  Component Value Date   WBC 4.5 10/07/2020   HGB 11.4 (L) 10/07/2020   HCT 35.5 (L) 10/07/2020   PLT 211.0 10/07/2020   GLUCOSE 85 05/04/2020   CHOL 202 (H) 05/04/2020   TRIG 69 05/04/2020   HDL 71 05/04/2020   LDLCALC 115 (H) 05/04/2020   ALT 11 05/04/2020   AST 14 05/04/2020   NA 139 05/04/2020   K 4.3 05/04/2020   CL 105 05/04/2020   CREATININE 0.75 05/04/2020   BUN 11 05/04/2020   CO2 29 05/04/2020   TSH 0.92 10/07/2020   INR 1.12 03/29/2012    Lab Results  Component Value Date   TSH 0.92 10/07/2020   Lab Results  Component Value Date   WBC 4.5 10/07/2020   HGB 11.4 (L) 10/07/2020   HCT 35.5 (L) 10/07/2020   MCV 85.6 10/07/2020   PLT 211.0 10/07/2020   Lab Results  Component Value Date   NA 139 05/04/2020   K 4.3 05/04/2020   CO2 29 05/04/2020   GLUCOSE 85 05/04/2020   BUN 11 05/04/2020   CREATININE 0.75 05/04/2020   BILITOT 0.4 05/04/2020   ALKPHOS 57 10/10/2019   AST 14 05/04/2020   ALT 11 05/04/2020   PROT 6.3 05/04/2020   ALBUMIN 4.0 10/10/2019   CALCIUM 9.1 05/04/2020   GFR 85.17 10/10/2019   Lab Results  Component Value Date   CHOL 202 (H) 05/04/2020   Lab Results  Component Value Date   HDL 71 05/04/2020   Lab Results  Component Value Date   LDLCALC 115 (H) 05/04/2020   Lab Results  Component Value Date   TRIG 69 05/04/2020   Lab Results  Component Value Date   CHOLHDL 2.8 05/04/2020   No results found for: HGBA1C     Assessment & Plan:   Problem List Items Addressed This Visit      Unprioritized   Grief reaction    Inc zoloft to 100 mg  con't wellbutrin and xanax prn  F/u cpe        Other Visit Diagnoses    Anxiety and  depression    -  Primary   Relevant Medications   sertraline (ZOLOFT) 100 MG tablet      Meds ordered this encounter  Medications  . sertraline (ZOLOFT) 100 MG tablet    Sig: Take 1 tablet (100 mg total) by mouth daily.    Dispense:  90 tablet    Refill:  3    I, Ann Held, DO, personally preformed the services described in this documentation.  All medical record entries made by the scribe were at my direction and in my presence.  I have reviewed the chart and discharge instructions (if applicable) and agree that the record reflects my personal performance and is accurate and complete. 11/11/20  I,[name],acting as a scribe for Ann Held, DO.,have documented all relevant documentation on the behalf of Ann Held, DO,as directed by  Ann Held, DO while in the presence of [place], DO, have reviewed all documentation for this visit. The documentation on 11/11/20 for the exam, diagnosis, procedures, and orders are all accurate and complete.   Ann Held, DO

## 2020-11-11 NOTE — Patient Instructions (Signed)
Complicated Grief Grief is a normal response to the death of someone close to you. Feelings of fear, anger, and guilt can affect almost everyone who loses a loved one. It is also common to have symptoms of depression while you are grieving. These include problems with sleep, loss of appetite, and lack of energy. They may last for weeks or months after a loss. Complicated grief is different from normal grief or depression. Normal grieving involves sadness and feelings of loss, but those feelings get better and heal over time. Complicated grief is a severe type of grief that lasts for a long time, usually for several months to a year or longer. It interferes with your ability to function normally. Complicated grief may require treatment from a mental health care provider. What are the causes? The cause of this condition is not known. It is not clear why some people continue to struggle with grief and others do not. What increases the risk? You are more likely to develop this condition if:  The death of your loved one was sudden or unexpected.  The death of your loved one was due to a violent event.  Your loved one died from suicide.  Your loved one was a child or a young person.  You were very close to your loved one, or you were dependent on him or her.  You have a history of depression or anxiety. What are the signs or symptoms? Symptoms of this condition include:  Feeling disbelief or having a lack of emotion (numbness).  Being unable to enjoy good memories of your loved one.  Needing to avoid anything or anyone that reminds you of your loved one.  Being unable to stop thinking about the death.  Feeling intense anger or guilt.  Feeling alone and hopeless.  Feeling that your life is meaningless and empty.  Losing the desire to move on with your life. How is this diagnosed? This condition may be diagnosed based on:  Your symptoms. Complicated grief will be diagnosed if you have  ongoing symptoms of grief for 6-12 months or longer.  The effect of symptoms on your life. You may be diagnosed with this condition if your symptoms are interfering with your ability to live your life. Your health care provider may recommend that you see a mental health care provider. Many symptoms of depression are similar to the symptoms of complicated grief. It is important to be evaluated for complicated grief along with other mental health conditions. How is this treated? This condition is most commonly treated with talk therapy. This therapy is offered by a mental health specialist (psychiatrist). During therapy:  You will learn healthy ways to cope with the loss of your loved one.  Your mental health care provider may recommend antidepressant medicines.   Follow these instructions at home: Lifestyle  Take care of yourself. ? Eat on a regular basis, and maintain a healthy diet. Eat plenty of fruits, vegetables, lean protein, and whole grains. ? Try to get some exercise each day. Aim for 30 minutes of exercise on most days of the week. ? Keep a consistent sleep schedule. Try to get 8 or more hours of sleep each night. ? Start doing the things that you used to enjoy.  Do not use drugs or alcohol to ease your symptoms.  Spend time with friends and loved ones.   General instructions  Take over-the-counter and prescription medicines only as told by your health care provider.  Consider joining a grief (  bereavement) support group to help you deal with your loss.  Keep all follow-up visits as told by your health care provider. This is important. Contact a health care provider if:  Your symptoms prevent you from functioning normally.  Your symptoms do not get better with treatment. Get help right away if:  You have serious thoughts about hurting yourself or someone else.  You have suicidal feelings. If you ever feel like you may hurt yourself or others, or have thoughts about  taking your own life, get help right away. You can go to your nearest emergency department or call:  Your local emergency services (911 in the [place]  A suicide crisis helpline, such as the [facility] [place] at 717-161-2853. This is open 24 hours a day. Summary  Complicated grief is a severe type of grief that lasts for a long time. This grief is not likely to go away on its own. Get the help you need.  Some griefs are more difficult than others and can cause this condition. You may need a certain type of treatment to help you recover if the loss of your loved one was sudden, violent, or due to suicide.  You may feel guilty about moving on with your life. Getting help does not mean that you are forgetting your loved one. It means that you are taking care of yourself.  Complicated grief is best treated with talk therapy. Medicines may also be prescribed.  Seek the help you need, and find support that will help you recover. This information is not intended to replace advice given to you by your health care provider. Make sure you discuss any questions you have with your health care provider. Document Revised: 01/08/2020 Document Reviewed: 01/08/2020 Elsevier Patient Education  [place].

## 2020-11-11 NOTE — Assessment & Plan Note (Signed)
Inc zoloft to 100 mg  con't wellbutrin and xanax prn  F/u cpe

## 2020-11-24 ENCOUNTER — Ambulatory Visit (INDEPENDENT_AMBULATORY_CARE_PROVIDER_SITE_OTHER): Payer: BC Managed Care – PPO | Admitting: Clinical

## 2020-11-24 DIAGNOSIS — F4323 Adjustment disorder with mixed anxiety and depressed mood: Secondary | ICD-10-CM | POA: Diagnosis not present

## 2020-11-30 ENCOUNTER — Ambulatory Visit: Payer: BC Managed Care – PPO | Admitting: Psychology

## 2020-12-10 ENCOUNTER — Other Ambulatory Visit: Payer: Self-pay | Admitting: Family Medicine

## 2020-12-10 DIAGNOSIS — F411 Generalized anxiety disorder: Secondary | ICD-10-CM

## 2020-12-10 NOTE — Telephone Encounter (Signed)
Requesting: Alprazolam Contract: 05/06/20   UDS: 05/06/20   Last Visit: 11/11/2020 Next Visit: 05/09/2021 Last Refill: 09/06/2020

## 2020-12-17 ENCOUNTER — Ambulatory Visit: Payer: BC Managed Care – PPO | Admitting: Psychology

## 2021-03-21 ENCOUNTER — Other Ambulatory Visit: Payer: Self-pay | Admitting: Family Medicine

## 2021-03-21 DIAGNOSIS — F411 Generalized anxiety disorder: Secondary | ICD-10-CM

## 2021-03-22 MED ORDER — ALPRAZOLAM 0.5 MG PO TABS: ORAL_TABLET | ORAL | 0 refills | Status: DC

## 2021-03-22 NOTE — Telephone Encounter (Signed)
Patient is requesting a refill of the following medications: Requested Prescriptions   Pending Prescriptions Disp Refills   ALPRAZolam (XANAX) 0.5 MG tablet 90 tablet 0    Date of patient request: 11/19/20 Last office visit: 11/11/20 Date of last refill: 90 Last refill amount: 1/2 tab TID PRN Follow up time period per chart: 05/09/21

## 2021-04-25 ENCOUNTER — Other Ambulatory Visit (HOSPITAL_BASED_OUTPATIENT_CLINIC_OR_DEPARTMENT_OTHER): Payer: Self-pay | Admitting: Family Medicine

## 2021-04-25 DIAGNOSIS — Z1231 Encounter for screening mammogram for malignant neoplasm of breast: Secondary | ICD-10-CM

## 2021-05-09 ENCOUNTER — Encounter: Payer: Self-pay | Admitting: Family Medicine

## 2021-05-09 ENCOUNTER — Ambulatory Visit (INDEPENDENT_AMBULATORY_CARE_PROVIDER_SITE_OTHER): Payer: BC Managed Care – PPO | Admitting: Family Medicine

## 2021-05-09 ENCOUNTER — Other Ambulatory Visit: Payer: Self-pay

## 2021-05-09 VITALS — BP 120/80 | HR 68 | Temp 98.9°F | Resp 18 | Ht 60.0 in | Wt 156.6 lb

## 2021-05-09 DIAGNOSIS — I10 Essential (primary) hypertension: Secondary | ICD-10-CM

## 2021-05-09 DIAGNOSIS — Z1159 Encounter for screening for other viral diseases: Secondary | ICD-10-CM | POA: Diagnosis not present

## 2021-05-09 DIAGNOSIS — Z0000 Encounter for general adult medical examination without abnormal findings: Secondary | ICD-10-CM

## 2021-05-09 DIAGNOSIS — E559 Vitamin D deficiency, unspecified: Secondary | ICD-10-CM

## 2021-05-09 DIAGNOSIS — F418 Other specified anxiety disorders: Secondary | ICD-10-CM

## 2021-05-09 DIAGNOSIS — Z23 Encounter for immunization: Secondary | ICD-10-CM

## 2021-05-09 DIAGNOSIS — E2839 Other primary ovarian failure: Secondary | ICD-10-CM

## 2021-05-09 MED ORDER — LISINOPRIL 10 MG PO TABS: 10.0000 mg | ORAL_TABLET | Freq: Every day | ORAL | 3 refills | Status: DC

## 2021-05-09 MED ORDER — BUPROPION HCL ER (XL) 300 MG PO TB24: 300.0000 mg | ORAL_TABLET | Freq: Every day | ORAL | 3 refills | Status: DC

## 2021-05-09 NOTE — Assessment & Plan Note (Signed)
ghm utd Check labs  See avs  

## 2021-05-09 NOTE — Progress Notes (Signed)
Subjective:   By signing my name below, I, Katelyn Bailey, attest that this documentation has been prepared under the direction and in the presence of Ann Held, DO. 05/09/2021    Patient ID: Katelyn Bailey, female    DOB: 01/25/1966, 55 y.o.   MRN: [id number]  Chief Complaint  Patient presents with   Annual Exam    Pt states fasting     HPI Patient is in today for a comprehensive physical exam  She denies fever, hearing loss, ear pain,congestion, sinus pain, sore throat, eye pain, chest pain, palpitations, cough, shortness of breath, wheezing, nausea. vomiting, diarrhea, constipation, blood in stool, dysuria,frequency, hematuria and headaches.   Past Medical History:  Diagnosis Date   Acid reflux disease    resolved with wt loss   Anxiety    Cancer ([place])    SKIN CA   Complication of anesthesia    headaches postop   Dizziness    Essential hypertension    Headache(784.0)    Hiatal hernia    resolved after gastric bypass   Hypercholesterolemia    Kidney stone    Neuralgia    occipital   Occipital neuralgia    Overweight(278.02)     Past Surgical History:  Procedure Laterality Date   ABDOMINAL HYSTERECTOMY  04/09/2012   Procedure: HYSTERECTOMY ABDOMINAL;  Surgeon: Gus Height, MD;  Location: [facility];  Service: Gynecology;  Laterality: N/A;   CESAREAN SECTION  1990   ROUX-EN-Y GASTRIC BYPASS  03/20/2011   skin carcinoma  06/21/2012   removed from upper lip   TUBAL LIGATION  1997    Family History  Problem Relation Age of Onset   Heart disease Father    Cancer Mother        breast   Arthritis Mother    Diabetes Brother    Heart disease Paternal Grandfather    Colon cancer Neg Hx    Colon polyps Neg Hx    Esophageal cancer Neg Hx    Rectal cancer Neg Hx    Stomach cancer Neg Hx     Social History   Socioeconomic History   Marital status: Widowed    Spouse name: william   Number of children: 2   Years of education: Not on file   Highest education  level: Not on file  Occupational History   Occupation: plant sanitation    Comment: [employer]  Tobacco Use   Smoking status: Never   Smokeless tobacco: Never  Vaping Use   Vaping Use: Never used  Substance and Sexual Activity   Alcohol use: No   Drug use: No   Sexual activity: Yes    Partners: Male  Other Topics Concern   Not on file  Social History Narrative   Married x 18 years   2 sons--one son died suddenly in November 08, 2006 with no known cause   Husband passed away last year    Social Determinants of Health   Financial Resource Strain: Not on file  Food Insecurity: Not on file  Transportation Needs: Not on file  Physical Activity: Not on file  Stress: Not on file  Social Connections: Not on file  Intimate Partner Violence: Not on file    Outpatient Medications Prior to Visit  Medication Sig Dispense Refill   ALPRAZolam (XANAX) 0.5 MG tablet 1/2 tab po tid prn 90 tablet 0   buPROPion (WELLBUTRIN XL) 300 MG 24 hr tablet Take 1 tablet (300 mg total) by mouth daily. [place]  tablet 3   CALCIUM CITRATE-VITAMIN D PO Take 1 tablet by mouth 3 (three) times daily.     lisinopril (ZESTRIL) 10 MG tablet Take 1 tablet (10 mg total) by mouth daily. 90 tablet 2   metronidazole (NORITATE) 1 % cream Apply topically daily. 60 g 0   METRONIDAZOLE, TOPICAL, 0.75 % LOTN Apply a small amount to the affected area twice daily. 59 mL 0   Multiple Vitamin (MULTIVITAMIN WITH MINERALS) TABS Take 1 tablet by mouth 2 (two) times daily.     OVER THE COUNTER MEDICATION      sertraline (ZOLOFT) 100 MG tablet Take 1 tablet (100 mg total) by mouth daily. 90 tablet 3   Vitamin D, Ergocalciferol, (DRISDOL) 50000 units CAPS capsule TAKE 1 CAPSULE BY MOUTH EVERY 7 DAYS 12 capsule 2   Facility-Administered Medications Prior to Visit  Medication Dose Route Frequency Provider Last Rate Last Admin   0.9 %  sodium chloride infusion  500 mL Intravenous Once Jackquline Denmark, MD        No Known Allergies  Review of  Systems  Constitutional:  Negative for fever.  HENT:  Negative for congestion, ear pain, hearing loss, sinus pain and sore throat.   Eyes:  Negative for blurred vision and pain.  Respiratory:  Negative for cough, sputum production, shortness of breath and wheezing.   Cardiovascular:  Negative for chest pain and palpitations.  Gastrointestinal:  Negative for blood in stool, constipation, diarrhea, nausea and vomiting.  Genitourinary:  Negative for dysuria, frequency, hematuria and urgency.  Musculoskeletal:  Negative for back pain, falls and myalgias.  Neurological:  Negative for dizziness, sensory change, loss of consciousness, weakness and headaches.  Endo/Heme/Allergies:  Negative for environmental allergies. Does not bruise/bleed easily.  Psychiatric/Behavioral:  Negative for depression and suicidal ideas. The patient is not nervous/anxious and does not have insomnia.       Objective:    Physical Exam Constitutional:      General: She is not in acute distress.    Appearance: Normal appearance. She is not ill-appearing.  HENT:     Head: Normocephalic and atraumatic.     Right Ear: External ear normal.     Left Ear: External ear normal.  Eyes:     Extraocular Movements: Extraocular movements intact.     Pupils: Pupils are equal, round, and reactive to light.  Cardiovascular:     Rate and Rhythm: Normal rate and regular rhythm.     Pulses: Normal pulses.     Heart sounds: Normal heart sounds. No murmur heard.   No gallop.  Pulmonary:     Effort: Pulmonary effort is normal. No respiratory distress.     Breath sounds: Normal breath sounds. No wheezing, rhonchi or rales.  Abdominal:     General: Bowel sounds are normal. There is no distension.     Palpations: Abdomen is soft. There is no mass.     Tenderness: There is no abdominal tenderness. There is no guarding or rebound.     Hernia: No hernia is present.  Musculoskeletal:     Cervical back: Normal range of motion and neck  supple.  Lymphadenopathy:     Cervical: No cervical adenopathy.  Skin:    General: Skin is warm and dry.  Neurological:     Mental Status: She is alert and oriented to person, place, and time.  Psychiatric:        Behavior: Behavior normal.    LMP 11/08/2010  Wt Readings from Last 3  Encounters:  11/11/20 148 lb 12.8 oz (67.5 kg)  10/07/20 147 lb 12.8 oz (67 kg)  05/06/20 139 lb 12.8 oz (63.4 kg)    Diabetic Foot Exam - Simple   No data filed    Lab Results  Component Value Date   WBC 4.5 10/07/2020   HGB 11.4 (L) 10/07/2020   HCT 35.5 (L) 10/07/2020   PLT 211.0 10/07/2020   GLUCOSE 85 05/04/2020   CHOL 202 (H) 05/04/2020   TRIG 69 05/04/2020   HDL 71 05/04/2020   LDLCALC 115 (H) 05/04/2020   ALT 11 05/04/2020   AST 14 05/04/2020   NA 139 05/04/2020   K 4.3 05/04/2020   CL 105 05/04/2020   CREATININE 0.75 05/04/2020   BUN 11 05/04/2020   CO2 29 05/04/2020   TSH 0.92 10/07/2020   INR 1.12 03/29/2012    Lab Results  Component Value Date   TSH 0.92 10/07/2020   Lab Results  Component Value Date   WBC 4.5 10/07/2020   HGB 11.4 (L) 10/07/2020   HCT 35.5 (L) 10/07/2020   MCV 85.6 10/07/2020   PLT 211.0 10/07/2020   Lab Results  Component Value Date   NA 139 05/04/2020   K 4.3 05/04/2020   CO2 29 05/04/2020   GLUCOSE 85 05/04/2020   BUN 11 05/04/2020   CREATININE 0.75 05/04/2020   BILITOT 0.4 05/04/2020   ALKPHOS 57 10/10/2019   AST 14 05/04/2020   ALT 11 05/04/2020   PROT 6.3 05/04/2020   ALBUMIN 4.0 10/10/2019   CALCIUM 9.1 05/04/2020   GFR 85.17 10/10/2019   Lab Results  Component Value Date   CHOL 202 (H) 05/04/2020   Lab Results  Component Value Date   HDL 71 05/04/2020   Lab Results  Component Value Date   LDLCALC 115 (H) 05/04/2020   Lab Results  Component Value Date   TRIG 69 05/04/2020   Lab Results  Component Value Date   CHOLHDL 2.8 05/04/2020   No results found for: HGBA1C      Mammogram: Last completed on  05/04/2020. Results were normal. Next appointment scheduled for 05/23/2021 Colonoscopy: Last completed on 06/17/2019. Results showed minimal sigmoid diverticulosis, and non-bleeding internal hemorrhoids. Repeat in 10 years.  Assessment & Plan:   Problem List Items Addressed This Visit   None   No orders of the defined types were placed in this encounter.   I,Katelyn Bailey,acting as a Education administrator for [employer], DO.,have documented all relevant documentation on the behalf of Ann Held, DO,as directed by  Ann Held, DO while in the presence of Ann Held, [place], DO., personally preformed the services described in this documentation.  All medical record entries made by the scribe were at my direction and in my presence.  I have reviewed the chart and discharge instructions (if applicable) and agree that the record reflects my personal performance and is accurate and complete. 05/09/2021

## 2021-05-09 NOTE — Patient Instructions (Signed)
Preventive Care 40-53 Years Old, Female Preventive care refers to lifestyle choices and visits with your health care provider that can promote health and wellness. This includes: A yearly physical exam. This is also called an annual wellness visit. Regular dental and eye exams. Immunizations. Screening for certain conditions. Healthy lifestyle choices, such as: Eating a healthy diet. Getting regular exercise. Not using drugs or products that contain nicotine and tobacco. Limiting alcohol use. What can I expect for my preventive care visit? Physical exam Your health care provider will check your: Height and weight. These may be used to calculate your BMI (body mass index). BMI is a measurement that tells if you are at a healthy weight. Heart rate and blood pressure. Body temperature. Skin for abnormal spots. Counseling Your health care provider may ask you questions about your: Past medical problems. Family's medical history. Alcohol, tobacco, and drug use. Emotional well-being. Home life and relationship well-being. Sexual activity. Diet, exercise, and sleep habits. Work and work environment. Access to firearms. Method of birth control. Menstrual cycle. Pregnancy history. What immunizations do I need? Vaccines are usually given at various ages, according to a schedule. Your health care provider will recommend vaccines for you based on your age, medical history, and lifestyle or other factors, such as travel or where you work. What tests do I need? Blood tests Lipid and cholesterol levels. These may be checked every 5 years, or more often if you are over 50 years old. Hepatitis C test. Hepatitis B test. Screening Lung cancer screening. You may have this screening every year starting at age 55 if you have a 30-pack-year history of smoking and currently smoke or have quit within the past 15 years. Colorectal cancer screening. All adults should have this screening starting at  age 50 and continuing until age 75. Your health care provider may recommend screening at age 45 if you are at increased risk. You will have tests every 1-10 years, depending on your results and the type of screening test. Diabetes screening. This is done by checking your blood sugar (glucose) after you have not eaten for a while (fasting). You may have this done every 1-3 years. Mammogram. This may be done every 1-2 years. Talk with your health care provider about when you should start having regular mammograms. This may depend on whether you have a family history of breast cancer. BRCA-related cancer screening. This may be done if you have a family history of breast, ovarian, tubal, or peritoneal cancers. Pelvic exam and Pap test. This may be done every 3 years starting at age 21. Starting at age 30, this may be done every 5 years if you have a Pap test in combination with an HPV test. Other tests STD (sexually transmitted disease) testing, if you are at risk. Bone density scan. This is done to screen for osteoporosis. You may have this scan if you are at high risk for osteoporosis. Talk with your health care provider about your test results, treatment options, and if necessary, the need for more tests. Follow these instructions at home: Eating and drinking  Eat a diet that includes fresh fruits and vegetables, whole grains, lean protein, and low-fat dairy products. Take vitamin and mineral supplements as recommended by your health care provider. Do not drink alcohol if: Your health care provider tells you not to drink. You are pregnant, may be pregnant, or are planning to become pregnant. If you drink alcohol: Limit how much you have to 0-1 drink a day. Be   aware of how much alcohol is in your drink. In the [place] one drink equals one 12 oz bottle of beer (355 mL), one 5 oz glass of wine (148 mL), or one 1 oz glass of hard liquor (44 mL). Lifestyle Take daily care of your teeth and  gums. Brush your teeth every morning and night with fluoride toothpaste. Floss one time each day. Stay active. Exercise for at least 30 minutes 5 or more days each week. Do not use any products that contain nicotine or tobacco, such as cigarettes, e-cigarettes, and chewing tobacco. If you need help quitting, ask your health care provider. Do not use drugs. If you are sexually active, practice safe sex. Use a condom or other form of protection to prevent STIs (sexually transmitted infections). If you do not wish to become pregnant, use a form of birth control. If you plan to become pregnant, see your health care provider for a prepregnancy visit. If told by your health care provider, take low-dose aspirin daily starting at age 63. Find healthy ways to cope with stress, such as: Meditation, yoga, or listening to music. Journaling. Talking to a trusted person. Spending time with friends and family. Safety Always wear your seat belt while driving or riding in a vehicle. Do not drive: If you have been drinking alcohol. Do not ride with someone who has been drinking. When you are tired or distracted. While texting. Wear a helmet and other protective equipment during sports activities. If you have firearms in your house, make sure you follow all gun safety procedures. What's next? Visit your health care provider once a year for an annual wellness visit. Ask your health care provider how often you should have your eyes and teeth checked. Stay up to date on all vaccines. This information is not intended to replace advice given to you by your health care provider. Make sure you discuss any questions you have with your health care provider. Document Revised: 09/24/2020 Document Reviewed: 03/28/2018 Elsevier Patient Education  2022 [employer].

## 2021-05-09 NOTE — Progress Notes (Addendum)
Subjective:   By signing my name below, I, [name], attest that this documentation has been prepared under the direction and in the presence of Ann Held, DO. 05/09/2021    Patient ID: Katelyn Bailey, female    DOB: [birth year], 55 y.o.   MRN: [id number]  Chief Complaint  Patient presents with   Annual Exam    Pt states fasting     HPI Patient is in today for a comprehensive physical exam.  She mentions a mole on her back that has been there for a while but says it does not bother her.  She is still using 0.5 mg xanax to manage her anxiety and uses it prn.  She is requesting a refill on 300 mg bupropion and 10 mg lisinopril.  She denies fever, hearing loss, ear pain,congestion, sinus pain, sore throat, eye pain, chest pain, palpitations, cough, shortness of breath, wheezing, nausea. vomiting, diarrhea, constipation, blood in stool, dysuria,frequency, hematuria and headaches.   She is willing to get the flu vaccine at this visit. She has 4 Pfizer Covid-19 vaccines at this time.  She is UTD on vision care.  She participates in regular exercise.  Past Medical History:  Diagnosis Date   Acid reflux disease    resolved with wt loss   Anxiety    Cancer ([place])    SKIN CA   Complication of anesthesia    headaches postop   Dizziness    Essential hypertension    Headache(784.0)    Hiatal hernia    resolved after gastric bypass   Hypercholesterolemia    Kidney stone    Neuralgia    occipital   Occipital neuralgia    Overweight(278.02)     Past Surgical History:  Procedure Laterality Date   ABDOMINAL HYSTERECTOMY  04/09/2012   Procedure: HYSTERECTOMY ABDOMINAL;  Surgeon: Gus Height, MD;  Location: [facility];  Service: Gynecology;  Laterality: N/A;   CESAREAN SECTION  1990   ROUX-EN-Y GASTRIC BYPASS  03/20/2011   skin carcinoma  06/21/2012   removed from upper lip   TUBAL LIGATION  1997    Family History  Problem Relation Age of Onset   Heart disease  Father    Cancer Mother        breast   Arthritis Mother    Diabetes Brother    Heart disease Paternal Grandfather    Colon cancer Neg Hx    Colon polyps Neg Hx    Esophageal cancer Neg Hx    Rectal cancer Neg Hx    Stomach cancer Neg Hx     Social History   Socioeconomic History   Marital status: Widowed    Spouse name: william   Number of children: 2   Years of education: Not on file   Highest education level: Not on file  Occupational History   Occupation: plant sanitation    Comment: [employer]  Tobacco Use   Smoking status: Never   Smokeless tobacco: Never  Vaping Use   Vaping Use: Never used  Substance and Sexual Activity   Alcohol use: No   Drug use: No   Sexual activity: Yes    Partners: Male  Other Topics Concern   Not on file  Social History Narrative   Married x 18 years   2 sons--one son died suddenly in 11-07-06 with no known cause   Husband passed away last year    Social Determinants of Health   Financial Resource Strain: Not  on file  Food Insecurity: Not on file  Transportation Needs: Not on file  Physical Activity: Not on file  Stress: Not on file  Social Connections: Not on file  Intimate Partner Violence: Not on file    Outpatient Medications Prior to Visit  Medication Sig Dispense Refill   ALPRAZolam (XANAX) 0.5 MG tablet 1/2 tab po tid prn 90 tablet 0   CALCIUM CITRATE-VITAMIN D PO Take 1 tablet by mouth 3 (three) times daily.     metronidazole (NORITATE) 1 % cream Apply topically daily. 60 g 0   METRONIDAZOLE, TOPICAL, 0.75 % LOTN Apply a small amount to the affected area twice daily. 59 mL 0   Multiple Vitamin (MULTIVITAMIN WITH MINERALS) TABS Take 1 tablet by mouth 2 (two) times daily.     OVER THE COUNTER MEDICATION      sertraline (ZOLOFT) 100 MG tablet Take 1 tablet (100 mg total) by mouth daily. 90 tablet 3   Vitamin D, Ergocalciferol, (DRISDOL) 50000 units CAPS capsule TAKE 1 CAPSULE BY MOUTH EVERY 7 DAYS 12 capsule 2   buPROPion  (WELLBUTRIN XL) 300 MG 24 hr tablet Take 1 tablet (300 mg total) by mouth daily. 90 tablet 3   lisinopril (ZESTRIL) 10 MG tablet Take 1 tablet (10 mg total) by mouth daily. 90 tablet 2   Facility-Administered Medications Prior to Visit  Medication Dose Route Frequency Provider Last Rate Last Admin   0.9 %  sodium chloride infusion  500 mL Intravenous Once Jackquline Denmark, MD        No Known Allergies  Review of Systems  Constitutional:  Negative for fever.  HENT:  Negative for congestion, ear pain, hearing loss, sinus pain and sore throat.   Eyes:  Negative for blurred vision and pain.  Respiratory:  Negative for cough, sputum production, shortness of breath and wheezing.   Cardiovascular:  Negative for chest pain and palpitations.  Gastrointestinal:  Negative for blood in stool, constipation, diarrhea, nausea and vomiting.  Genitourinary:  Negative for dysuria, frequency, hematuria and urgency.  Musculoskeletal:  Negative for back pain, falls and myalgias.  Neurological:  Negative for dizziness, sensory change, loss of consciousness, weakness and headaches.  Endo/Heme/Allergies:  Negative for environmental allergies. Does not bruise/bleed easily.  Psychiatric/Behavioral:  Negative for depression and suicidal ideas. The patient is not nervous/anxious and does not have insomnia.       Objective:    Physical Exam Constitutional:      General: She is not in acute distress.    Appearance: Normal appearance. She is not ill-appearing.  HENT:     Head: Normocephalic and atraumatic.     Right Ear: External ear normal.     Left Ear: External ear normal.  Eyes:     Extraocular Movements: Extraocular movements intact.     Pupils: Pupils are equal, round, and reactive to light.  Cardiovascular:     Rate and Rhythm: Normal rate and regular rhythm.     Pulses: Normal pulses.     Heart sounds: Normal heart sounds. No murmur heard.   No gallop.  Pulmonary:     Effort: Pulmonary effort is  normal. No respiratory distress.     Breath sounds: Normal breath sounds. No wheezing, rhonchi or rales.  Abdominal:     General: Bowel sounds are normal. There is no distension.     Palpations: Abdomen is soft. There is no mass.     Tenderness: There is no abdominal tenderness. There is no guarding or rebound.  Hernia: No hernia is present.  Musculoskeletal:     Cervical back: Normal range of motion and neck supple.  Lymphadenopathy:     Cervical: No cervical adenopathy.  Skin:    General: Skin is warm and dry.     Comments: 1/2 cm raised round rough spot on back that looks like keratosis   Neurological:     Mental Status: She is alert and oriented to person, place, and time.  Psychiatric:        Behavior: Behavior normal.    BP 120/84 (BP Location: Left Arm, Patient Position: Sitting, Cuff Size: Normal)   Pulse 68   Temp 98.9 F (37.2 C) (Oral)   Resp 18   Ht 5' (1.524 m)   Wt 156 lb 9.6 oz (71 kg)   LMP 11/08/2010   SpO2 100%   BMI 30.58 kg/m  Wt Readings from Last 3 Encounters:  05/09/21 156 lb 9.6 oz (71 kg)  11/11/20 148 lb 12.8 oz (67.5 kg)  10/07/20 147 lb 12.8 oz (67 kg)    Diabetic Foot Exam - Simple   No data filed    Lab Results  Component Value Date   WBC 4.5 10/07/2020   HGB 11.4 (L) 10/07/2020   HCT 35.5 (L) 10/07/2020   PLT 211.0 10/07/2020   GLUCOSE 85 05/04/2020   CHOL 202 (H) 05/04/2020   TRIG 69 05/04/2020   HDL 71 05/04/2020   LDLCALC 115 (H) 05/04/2020   ALT 11 05/04/2020   AST 14 05/04/2020   NA 139 05/04/2020   K 4.3 05/04/2020   CL 105 05/04/2020   CREATININE 0.75 05/04/2020   BUN 11 05/04/2020   CO2 29 05/04/2020   TSH 0.92 10/07/2020   INR 1.12 03/29/2012    Lab Results  Component Value Date   TSH 0.92 10/07/2020   Lab Results  Component Value Date   WBC 4.5 10/07/2020   HGB 11.4 (L) 10/07/2020   HCT 35.5 (L) 10/07/2020   MCV 85.6 10/07/2020   PLT 211.0 10/07/2020   Lab Results  Component Value Date   NA 139  05/04/2020   K 4.3 05/04/2020   CO2 29 05/04/2020   GLUCOSE 85 05/04/2020   BUN 11 05/04/2020   CREATININE 0.75 05/04/2020   BILITOT 0.4 05/04/2020   ALKPHOS 57 10/10/2019   AST 14 05/04/2020   ALT 11 05/04/2020   PROT 6.3 05/04/2020   ALBUMIN 4.0 10/10/2019   CALCIUM 9.1 05/04/2020   GFR 85.17 10/10/2019   Lab Results  Component Value Date   CHOL 202 (H) 05/04/2020   Lab Results  Component Value Date   HDL 71 05/04/2020   Lab Results  Component Value Date   LDLCALC 115 (H) 05/04/2020   Lab Results  Component Value Date   TRIG 69 05/04/2020   Lab Results  Component Value Date   CHOLHDL 2.8 05/04/2020   No results found for: HGBA1C      Mammogram: Last completed on 05/04/2020. Results were normal. Next appointment scheduled for 05/23/2021 Colonoscopy: Last completed on 06/17/2019. Results showed minimal sigmoid diverticulosis, and non-bleeding internal hemorrhoids. Repeat in 10 years.  Assessment & Plan:   Problem List Items Addressed This Visit       Unprioritized   Depression with anxiety   Relevant Medications   buPROPion (WELLBUTRIN XL) 300 MG 24 hr tablet   Essential hypertension    Well controlled, no changes to meds. Encouraged heart healthy diet such as the DASH diet and exercise  as tolerated.       Relevant Medications   lisinopril (ZESTRIL) 10 MG tablet   Other Relevant Orders   Comprehensive metabolic panel   Lipid panel   Preventative health care - Primary    ghm utd Check labs See avs      Relevant Orders   CBC with Differential/Platelet   Comprehensive metabolic panel   Lipid panel   TSH   VITAMIN D 25 Hydroxy (Vit-D Deficiency, Fractures)   Other Visit Diagnoses     Vitamin D deficiency       Relevant Orders   VITAMIN D 25 Hydroxy (Vit-D Deficiency, Fractures)   Need for hepatitis C screening test       Relevant Orders   Hepatitis C antibody   Need for influenza vaccination       Relevant Orders   Flu Vaccine QUAD 43mo+IM  (Fluarix, Fluzone & Alfiuria Quad PF)   Estrogen deficiency       Relevant Orders   DG Bone Density        Meds ordered this encounter  Medications   lisinopril (ZESTRIL) 10 MG tablet    Sig: Take 1 tablet (10 mg total) by mouth daily.    Dispense:  90 tablet    Refill:  3   buPROPion (WELLBUTRIN XL) 300 MG 24 hr tablet    Sig: Take 1 tablet (300 mg total) by mouth daily.    Dispense:  90 tablet    Refill:  3     I,[name],acting as a Education administrator for [employer], DO.,have documented all relevant documentation on the behalf of Ann Held, DO,as directed by  Ann Held, DO while in the presence of Ann Held, DO.   I, Ann Held, DO., personally preformed the services described in this documentation.  All medical record entries made by the scribe were at my direction and in my presence.  I have reviewed the chart and discharge instructions (if applicable) and agree that the record reflects my personal performance and is accurate and complete. 05/09/2021

## 2021-05-09 NOTE — Assessment & Plan Note (Signed)
Well controlled, no changes to meds. Encouraged heart healthy diet such as the DASH diet and exercise as tolerated.  °

## 2021-05-10 LAB — COMPREHENSIVE METABOLIC PANEL
ALT: 16 U/L (ref 0–35)
AST: 23 U/L (ref 0–37)
Albumin: 4.2 g/dL (ref 3.5–5.2)
Alkaline Phosphatase: 78 U/L (ref 39–117)
BUN: 15 mg/dL (ref 6–23)
CO2: 27 mEq/L (ref 19–32)
Calcium: 9.3 mg/dL (ref 8.4–10.5)
Chloride: 104 mEq/L (ref 96–112)
Creatinine, Ser: 0.68 mg/dL (ref 0.40–1.20)
GFR: 99.54 mL/min (ref >60.00)
Glucose, Bld: 87 mg/dL (ref 70–99)
Potassium: 4 mEq/L (ref 3.5–5.1)
Sodium: 139 mEq/L (ref 135–145)
Total Bilirubin: 0.4 mg/dL (ref 0.2–1.2)
Total Protein: 6.4 g/dL (ref 6.0–8.3)

## 2021-05-10 LAB — CBC WITH DIFFERENTIAL/PLATELET
Basophils Absolute: 0 10*3/uL (ref 0.0–0.1)
Basophils Relative: 0.7 % (ref 0.0–3.0)
Eosinophils Absolute: 0.1 10*3/uL (ref 0.0–0.7)
Eosinophils Relative: 1.5 % (ref 0.0–5.0)
HCT: 35.2 % — ABNORMAL LOW (ref 36.0–46.0)
Hemoglobin: 11 g/dL — ABNORMAL LOW (ref 12.0–15.0)
Lymphocytes Relative: 35.6 % (ref 12.0–46.0)
Lymphs Abs: 1.9 10*3/uL (ref 0.7–4.0)
MCHC: 31.2 g/dL (ref 30.0–36.0)
MCV: 83.8 fl (ref 78.0–100.0)
Monocytes Absolute: 0.4 10*3/uL (ref 0.1–1.0)
Monocytes Relative: 8.5 % (ref 3.0–12.0)
Neutro Abs: 2.8 10*3/uL (ref 1.4–7.7)
Neutrophils Relative %: 53.7 % (ref 43.0–77.0)
Platelets: 220 10*3/uL (ref 150.0–400.0)
RBC: 4.2 Mil/uL (ref 3.87–5.11)
RDW: 15.7 % — ABNORMAL HIGH (ref 11.5–15.5)
WBC: 5.2 10*3/uL (ref 4.0–10.5)

## 2021-05-10 LAB — HEPATITIS C ANTIBODY
Hepatitis C Ab: NONREACTIVE
SIGNAL TO CUT-OFF: 0.04 (ref <1.00)

## 2021-05-10 LAB — LIPID PANEL
Cholesterol: 218 mg/dL — ABNORMAL HIGH (ref 0–200)
HDL: 85.2 mg/dL (ref >39.00)
LDL Cholesterol: 118 mg/dL — ABNORMAL HIGH (ref 0–99)
NonHDL: 133.08
Total CHOL/HDL Ratio: 3
Triglycerides: 75 mg/dL (ref 0.0–149.0)
VLDL: 15 mg/dL (ref 0.0–40.0)

## 2021-05-10 LAB — TSH: TSH: 0.81 u[IU]/mL (ref 0.35–5.50)

## 2021-05-10 LAB — VITAMIN D 25 HYDROXY (VIT D DEFICIENCY, FRACTURES): VITD: 35.43 ng/mL (ref 30.00–100.00)

## 2021-05-23 ENCOUNTER — Encounter (HOSPITAL_BASED_OUTPATIENT_CLINIC_OR_DEPARTMENT_OTHER): Payer: BC Managed Care – PPO

## 2021-05-23 DIAGNOSIS — Z1231 Encounter for screening mammogram for malignant neoplasm of breast: Secondary | ICD-10-CM

## 2021-06-06 ENCOUNTER — Encounter (HOSPITAL_BASED_OUTPATIENT_CLINIC_OR_DEPARTMENT_OTHER): Payer: Self-pay

## 2021-06-06 ENCOUNTER — Other Ambulatory Visit: Payer: Self-pay

## 2021-06-06 ENCOUNTER — Ambulatory Visit (HOSPITAL_BASED_OUTPATIENT_CLINIC_OR_DEPARTMENT_OTHER)
Admission: RE | Admit: 2021-06-06 | Discharge: 2021-06-06 | Disposition: A | Discharge status: Home / Self Care | Payer: BC Managed Care – PPO | Source: Ambulatory Visit | Attending: Family Medicine | Admitting: Family Medicine

## 2021-06-06 DIAGNOSIS — Z1231 Encounter for screening mammogram for malignant neoplasm of breast: Secondary | ICD-10-CM | POA: Diagnosis not present

## 2021-06-06 DIAGNOSIS — M810 Age-related osteoporosis without current pathological fracture: Secondary | ICD-10-CM | POA: Diagnosis not present

## 2021-06-06 DIAGNOSIS — Z780 Asymptomatic menopausal state: Secondary | ICD-10-CM | POA: Diagnosis not present

## 2021-06-06 DIAGNOSIS — E2839 Other primary ovarian failure: Secondary | ICD-10-CM | POA: Diagnosis not present

## 2021-06-06 DIAGNOSIS — M85852 Other specified disorders of bone density and structure, left thigh: Secondary | ICD-10-CM | POA: Diagnosis not present

## 2021-06-13 ENCOUNTER — Encounter: Payer: Self-pay | Admitting: Family Medicine

## 2021-06-22 NOTE — Telephone Encounter (Signed)
Clinical info submitted to insurance company. Waiting on summary of benefits to determine coverage. Will call patient to discuss once received.  

## 2021-06-27 ENCOUNTER — Other Ambulatory Visit: Payer: Self-pay | Admitting: Family Medicine

## 2021-06-27 DIAGNOSIS — F411 Generalized anxiety disorder: Secondary | ICD-10-CM

## 2021-06-28 ENCOUNTER — Other Ambulatory Visit: Payer: Self-pay | Admitting: Family Medicine

## 2021-06-28 DIAGNOSIS — F411 Generalized anxiety disorder: Secondary | ICD-10-CM

## 2021-06-28 NOTE — Telephone Encounter (Signed)
Requesting: alprazolam Contract:  UDS: 05/06/20 Last Visit: 05/09/21 Next Visit: 11/11/21 Last Refill: 03/22/21  Please Advise

## 2021-08-04 NOTE — Telephone Encounter (Signed)
Prolia is not covered under patient's insurance at this time resubmitted information for Evenity.

## 2021-09-27 ENCOUNTER — Other Ambulatory Visit: Payer: Self-pay | Admitting: Family Medicine

## 2021-09-27 DIAGNOSIS — F411 Generalized anxiety disorder: Secondary | ICD-10-CM

## 2021-09-28 DIAGNOSIS — H3589 Other specified retinal disorders: Secondary | ICD-10-CM | POA: Diagnosis not present

## 2021-09-28 DIAGNOSIS — H2513 Age-related nuclear cataract, bilateral: Secondary | ICD-10-CM | POA: Diagnosis not present

## 2021-09-28 NOTE — Telephone Encounter (Signed)
Requesting: alprazolam ?Contract:  ?UDS: 05/06/20 ?Last Visit: 05/09/21 ?Next Visit: 11/11/21 ?Last Refill: 06/28/21 ? ?Please Advise ? ?

## 2021-10-24 ENCOUNTER — Other Ambulatory Visit: Payer: Self-pay | Admitting: Family Medicine

## 2021-10-24 DIAGNOSIS — F419 Anxiety disorder, unspecified: Secondary | ICD-10-CM

## 2021-11-11 ENCOUNTER — Ambulatory Visit: Payer: BC Managed Care – PPO | Admitting: Family Medicine

## 2021-12-30 ENCOUNTER — Ambulatory Visit: Payer: BC Managed Care – PPO | Admitting: Family Medicine

## 2022-03-02 ENCOUNTER — Other Ambulatory Visit: Payer: Self-pay | Admitting: Family Medicine

## 2022-03-02 DIAGNOSIS — F411 Generalized anxiety disorder: Secondary | ICD-10-CM

## 2022-03-03 NOTE — Telephone Encounter (Signed)
Requesting:xanax 0.5 mg Contract:05/06/20 UDS:05/06/20 Last Visit:05/09/21 Next Visit:unknown Last Refill:09/28/21  Please Advise

## 2022-03-05 IMAGING — MG MM DIGITAL SCREENING BILAT W/ TOMO AND CAD
8 series · 8 of 24 positions shown · non-contrast
Comparison: Previous exam(s).

CLINICAL DATA: Screening.

EXAM:
DIGITAL SCREENING BILATERAL MAMMOGRAM WITH TOMOSYNTHESIS AND CAD
TECHNIQUE: Bilateral screening digital craniocaudal and mediolateral oblique
mammograms were obtained. Bilateral screening digital breast
tomosynthesis was performed. The images were evaluated with
computer-aided detection.

[L MLO synth-2D]
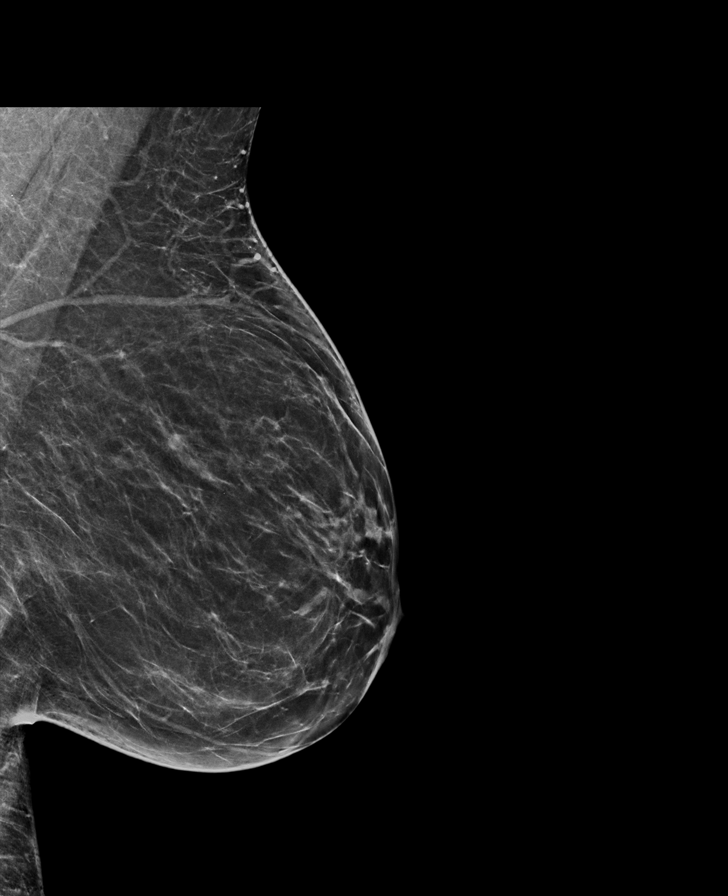

[L CC synth-2D]
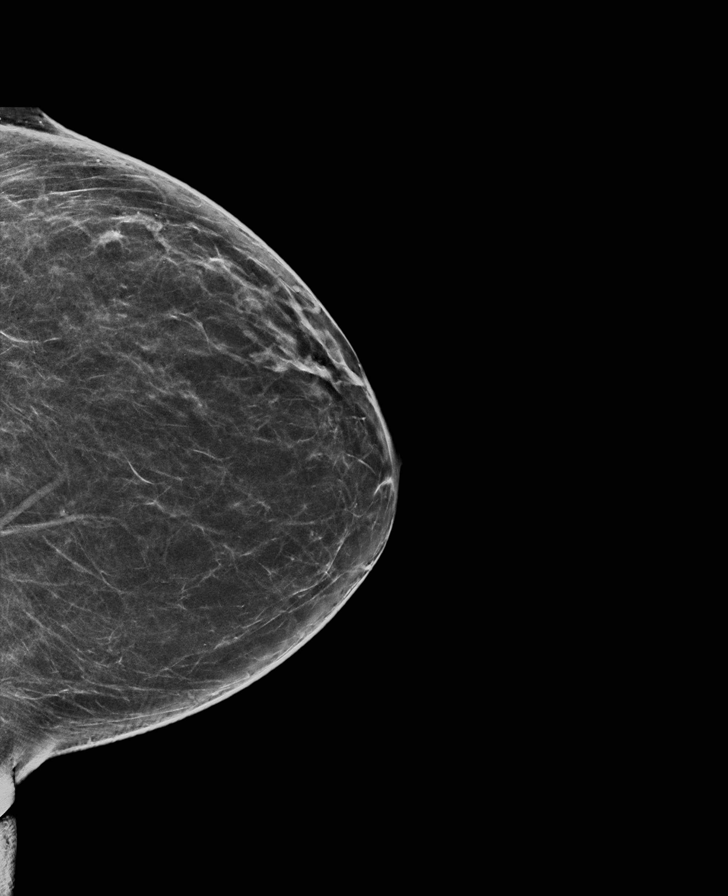

[R MLO synth-2D]
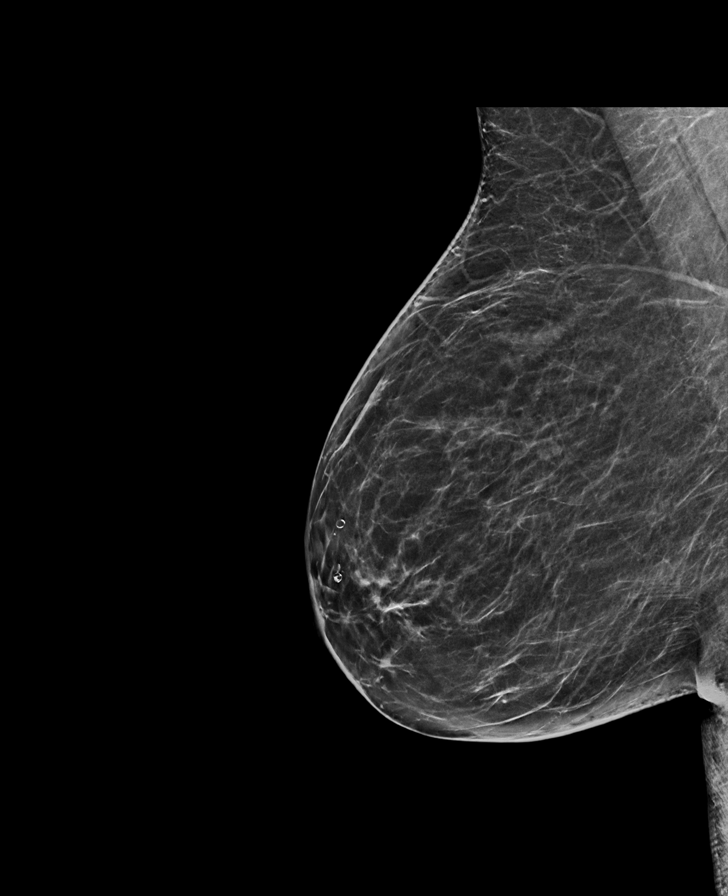

[R CC synth-2D]
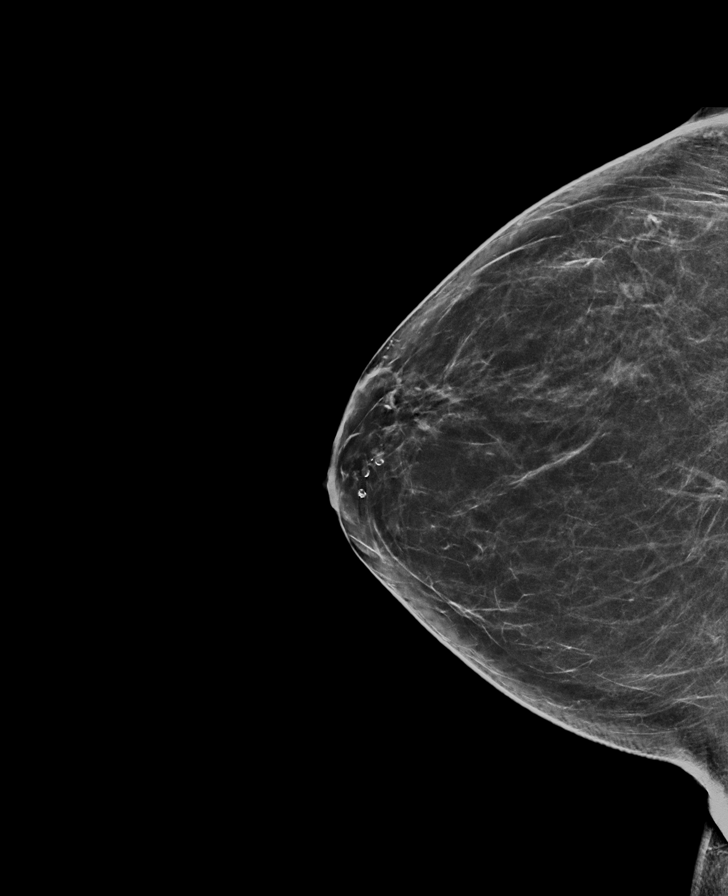

[L MLO tomo · tomo slice 34/67.0]
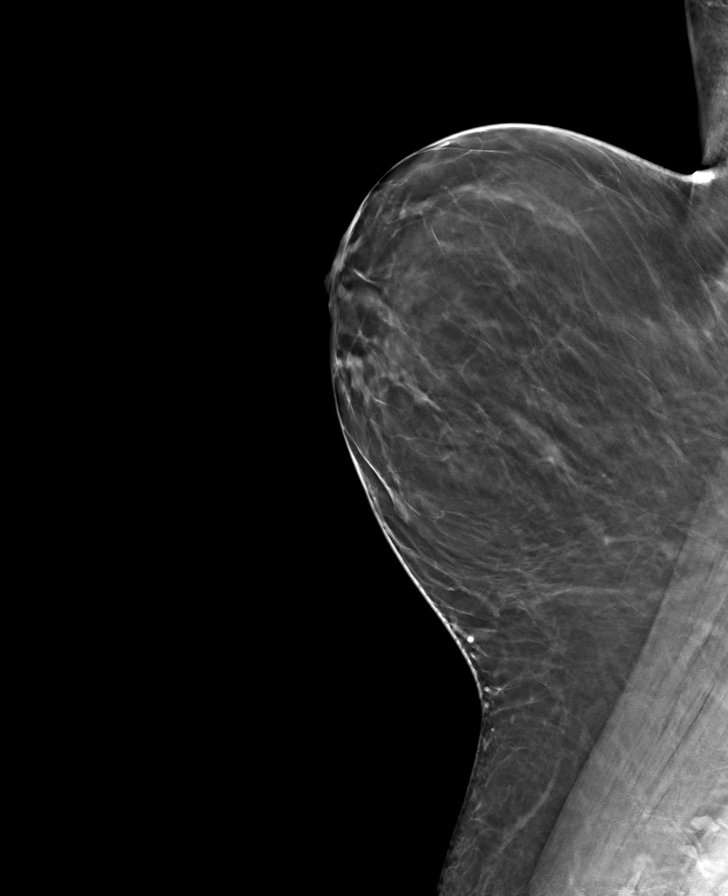

[R MLO tomo · tomo slice 33/65.0]
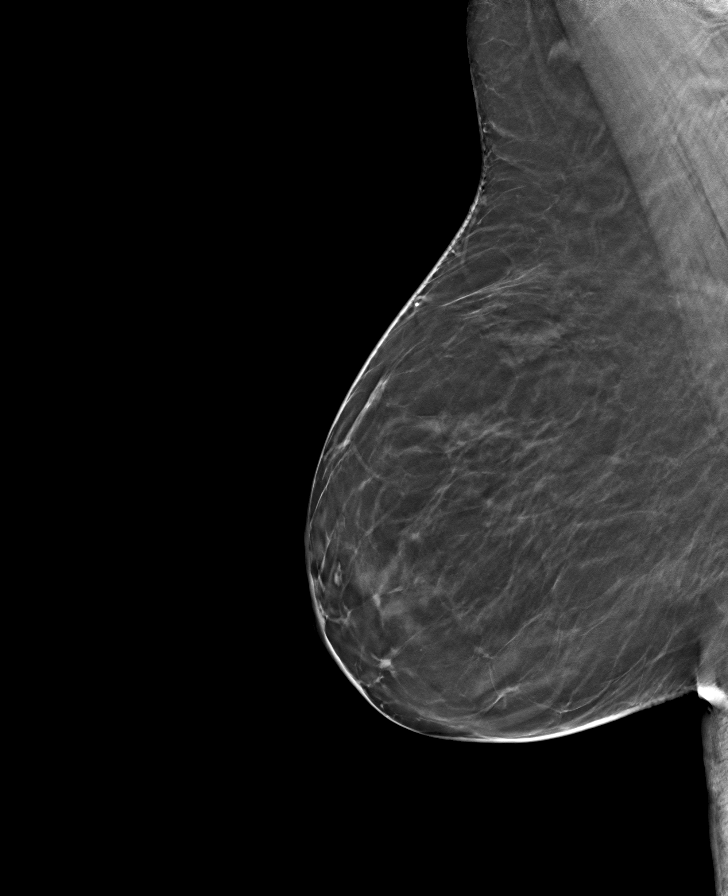

[R CC tomo · tomo slice 33/66.0]
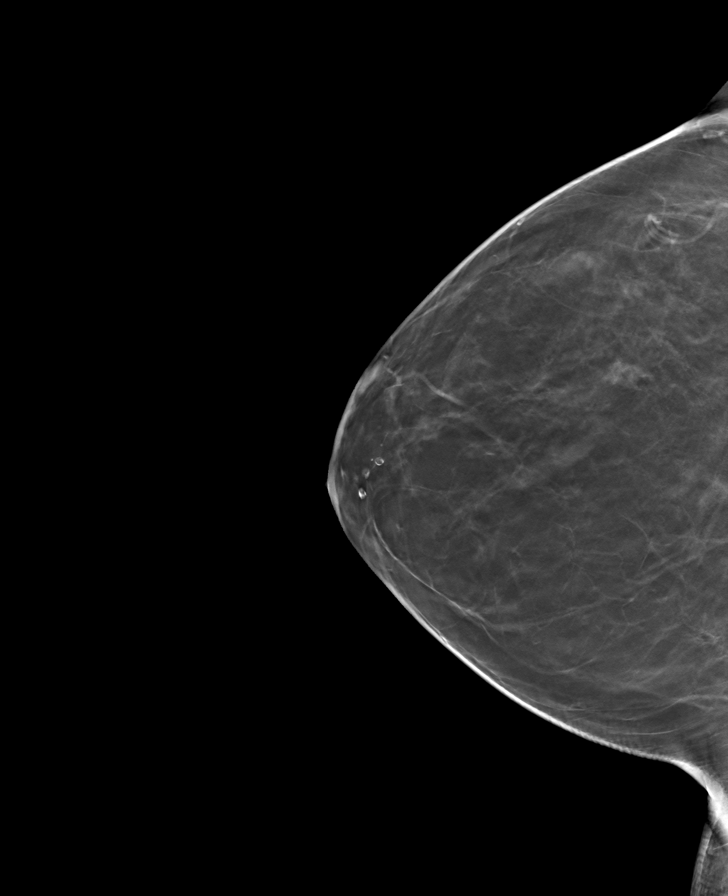

[L CC tomo · tomo slice 33/64.0]
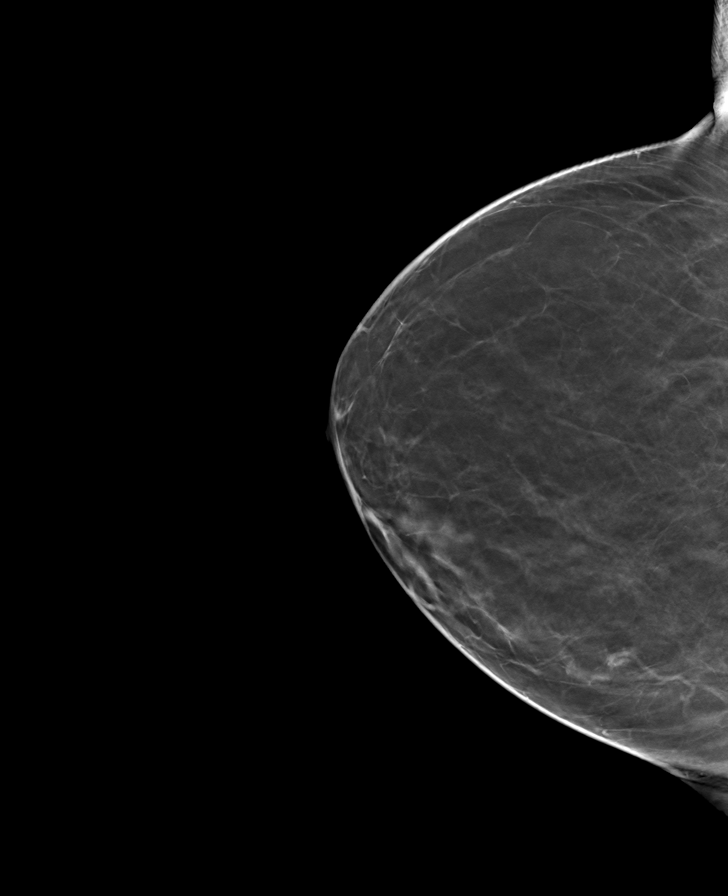

[8 of 24 positions shown; findings below may reference images not displayed]

ACR Breast Density Category b: There are scattered areas of
fibroglandular density.
FINDINGS: There are no findings suspicious for malignancy.
IMPRESSION: No mammographic evidence of malignancy. A result letter of this
screening mammogram will be mailed directly to the patient.

RECOMMENDATION:
Screening mammogram in one year. (Code:51-O-LD2)

BI-RADS CATEGORY  1: Negative.

## 2022-03-31 ENCOUNTER — Other Ambulatory Visit: Payer: Self-pay | Admitting: Family Medicine

## 2022-03-31 DIAGNOSIS — Z1231 Encounter for screening mammogram for malignant neoplasm of breast: Secondary | ICD-10-CM

## 2022-06-04 ENCOUNTER — Other Ambulatory Visit: Payer: Self-pay | Admitting: Family Medicine

## 2022-06-04 DIAGNOSIS — F418 Other specified anxiety disorders: Secondary | ICD-10-CM

## 2022-06-04 DIAGNOSIS — I10 Essential (primary) hypertension: Secondary | ICD-10-CM

## 2022-06-13 ENCOUNTER — Ambulatory Visit
Admission: RE | Admit: 2022-06-13 | Discharge: 2022-06-13 | Disposition: A | Discharge status: Home / Self Care | Payer: BC Managed Care – PPO | Source: Ambulatory Visit

## 2022-06-13 ENCOUNTER — Telehealth: Payer: Self-pay

## 2022-06-13 ENCOUNTER — Other Ambulatory Visit (HOSPITAL_COMMUNITY): Payer: Self-pay

## 2022-06-13 ENCOUNTER — Encounter: Payer: Self-pay | Admitting: Family Medicine

## 2022-06-13 ENCOUNTER — Ambulatory Visit (INDEPENDENT_AMBULATORY_CARE_PROVIDER_SITE_OTHER): Payer: BC Managed Care – PPO | Admitting: Family Medicine

## 2022-06-13 VITALS — BP 140/98 | HR 81 | Temp 98.3°F | Resp 18 | Ht 60.0 in | Wt 174.6 lb

## 2022-06-13 DIAGNOSIS — Z0000 Encounter for general adult medical examination without abnormal findings: Secondary | ICD-10-CM

## 2022-06-13 DIAGNOSIS — Z23 Encounter for immunization: Secondary | ICD-10-CM

## 2022-06-13 DIAGNOSIS — F411 Generalized anxiety disorder: Secondary | ICD-10-CM | POA: Diagnosis not present

## 2022-06-13 DIAGNOSIS — E559 Vitamin D deficiency, unspecified: Secondary | ICD-10-CM | POA: Diagnosis not present

## 2022-06-13 DIAGNOSIS — Z1231 Encounter for screening mammogram for malignant neoplasm of breast: Secondary | ICD-10-CM

## 2022-06-13 DIAGNOSIS — I10 Essential (primary) hypertension: Secondary | ICD-10-CM | POA: Diagnosis not present

## 2022-06-13 DIAGNOSIS — R011 Cardiac murmur, unspecified: Secondary | ICD-10-CM

## 2022-06-13 LAB — COMPREHENSIVE METABOLIC PANEL
ALT: 12 U/L (ref 0–35)
AST: 15 U/L (ref 0–37)
Albumin: 4.2 g/dL (ref 3.5–5.2)
Alkaline Phosphatase: 104 U/L (ref 39–117)
BUN: 12 mg/dL (ref 6–23)
CO2: 29 mEq/L (ref 19–32)
Calcium: 9 mg/dL (ref 8.4–10.5)
Chloride: 103 mEq/L (ref 96–112)
Creatinine, Ser: 0.66 mg/dL (ref 0.40–1.20)
GFR: 99.5 mL/min (ref >60.00)
Glucose, Bld: 86 mg/dL (ref 70–99)
Potassium: 4.2 mEq/L (ref 3.5–5.1)
Sodium: 139 mEq/L (ref 135–145)
Total Bilirubin: 0.4 mg/dL (ref 0.2–1.2)
Total Protein: 6.6 g/dL (ref 6.0–8.3)

## 2022-06-13 LAB — CBC WITH DIFFERENTIAL/PLATELET
Basophils Absolute: 0.1 10*3/uL (ref 0.0–0.1)
Basophils Relative: 1.2 % (ref 0.0–3.0)
Eosinophils Absolute: 0.1 10*3/uL (ref 0.0–0.7)
Eosinophils Relative: 1.3 % (ref 0.0–5.0)
HCT: 32.7 % — ABNORMAL LOW (ref 36.0–46.0)
Hemoglobin: 10 g/dL — ABNORMAL LOW (ref 12.0–15.0)
Lymphocytes Relative: 32.3 % (ref 12.0–46.0)
Lymphs Abs: 1.7 10*3/uL (ref 0.7–4.0)
MCHC: 30.7 g/dL (ref 30.0–36.0)
MCV: 76.2 fl — ABNORMAL LOW (ref 78.0–100.0)
Monocytes Absolute: 0.4 10*3/uL (ref 0.1–1.0)
Monocytes Relative: 7.1 % (ref 3.0–12.0)
Neutro Abs: 3 10*3/uL (ref 1.4–7.7)
Neutrophils Relative %: 58.1 % (ref 43.0–77.0)
Platelets: 249 10*3/uL (ref 150.0–400.0)
RBC: 4.29 Mil/uL (ref 3.87–5.11)
RDW: 18.1 % — ABNORMAL HIGH (ref 11.5–15.5)
WBC: 5.2 10*3/uL (ref 4.0–10.5)

## 2022-06-13 LAB — LIPID PANEL
Cholesterol: 244 mg/dL — ABNORMAL HIGH (ref 0–200)
HDL: 81.8 mg/dL (ref >39.00)
LDL Cholesterol: 144 mg/dL — ABNORMAL HIGH (ref 0–99)
NonHDL: 162.2
Total CHOL/HDL Ratio: 3
Triglycerides: 93 mg/dL (ref 0.0–149.0)
VLDL: 18.6 mg/dL (ref 0.0–40.0)

## 2022-06-13 LAB — TSH: TSH: 0.84 u[IU]/mL (ref 0.35–5.50)

## 2022-06-13 LAB — VITAMIN D 25 HYDROXY (VIT D DEFICIENCY, FRACTURES): VITD: 38.9 ng/mL (ref 30.00–100.00)

## 2022-06-13 MED ORDER — LISINOPRIL 20 MG PO TABS: 20.0000 mg | ORAL_TABLET | Freq: Every day | ORAL | 3 refills | Status: DC

## 2022-06-13 MED ORDER — ESCITALOPRAM OXALATE 20 MG PO TABS: 20.0000 mg | ORAL_TABLET | Freq: Every day | ORAL | 3 refills | Status: DC

## 2022-06-13 MED ORDER — ALPRAZOLAM 0.5 MG PO TABS: ORAL_TABLET | ORAL | 0 refills | Status: DC

## 2022-06-13 MED ORDER — WEGOVY 0.25 MG/0.5ML ~~LOC~~ SOAJ: 0.2500 mg | SUBCUTANEOUS | 0 refills | Status: DC

## 2022-06-13 NOTE — Telephone Encounter (Signed)
-----   Message from Ann Held, DO sent at 06/13/2022 11:21 AM EST ----- Pt would like to see if her ins will pay for evenity for osteoporosis

## 2022-06-13 NOTE — Assessment & Plan Note (Signed)
Start wegovy 0.25 weekly

## 2022-06-13 NOTE — Progress Notes (Addendum)
Subjective:   By signing my name below, I, [name], attest that this documentation has been prepared under the direction and in the presence of Ann Held, DO. 06/13/2022      Patient ID: Katelyn Bailey, female    DOB: July 18, 1966, 56 y.o.   MRN: [id number]  Chief Complaint  Patient presents with   Annual Exam    Pt states fasting     HPI Patient is in today for a comprehensive physical exam.   She reports having regular episodes of hot flashes and noticed significant weight gain. She has a history of menopause and thinks she may be in menopause. She is taking 100 mg Zoloft daily PO for a different issue but noticed no change in her current symptoms.  Her blood pressure is elevated during this visit. She reports her blood pressure typically measures 130/90 while at home. She continues taking 10 mg lisinopril daily PO and reports no new issues while taking it. She reports gaining weight may have contributed to her increased blood pressure.  BP Readings from Last 3 Encounters:  06/13/22 (!) 140/98  05/09/21 120/80  11/11/20 123/77   Pulse Readings from Last 3 Encounters:  06/13/22 81  05/09/21 68  11/11/20 66   Wt Readings from Last 3 Encounters:  06/13/22 174 lb 9.6 oz (79.2 kg)  05/09/21 156 lb 9.6 oz (71 kg)  11/11/20 148 lb 12.8 oz (67.5 kg)   She is requesting a refill on 0.5 mg alprazolam.  She denies having any fever, new muscle pain, new joint pain, new moles, congestion, sinus pain, sore throat, chest pain, palpations, cough, SOB, wheezing, n/v/d, constipation, blood in stool, dysuria, frequency, hematuria, or headaches at this time. She has no changes to her family medical history. She has no new surgical procedures to report.  She is due for a tetanus vaccine and is interested in receiving it during this visit. She is receiving the flu vaccine during this visit.    Past Medical History:  Diagnosis Date   Acid reflux disease    resolved with wt loss    Anxiety    Cancer ([place])    SKIN CA   Complication of anesthesia    headaches postop   Dizziness    Essential hypertension    Headache(784.0)    Hiatal hernia    resolved after gastric bypass   Hypercholesterolemia    Kidney stone    Neuralgia    occipital   Occipital neuralgia    Overweight(278.02)     Past Surgical History:  Procedure Laterality Date   ABDOMINAL HYSTERECTOMY  04/09/2012   Procedure: HYSTERECTOMY ABDOMINAL;  Surgeon: Gus Height, MD;  Location: [facility];  Service: Gynecology;  Laterality: N/A;   CESAREAN SECTION  1990   ROUX-EN-Y GASTRIC BYPASS  03/20/2011   skin carcinoma  06/21/2012   removed from upper lip   TUBAL LIGATION  1997    Family History  Problem Relation Age of Onset   Heart disease Father    Cancer Mother        breast   Arthritis Mother    Diabetes Brother    Heart disease Paternal Grandfather    Colon cancer Neg Hx    Colon polyps Neg Hx    Esophageal cancer Neg Hx    Rectal cancer Neg Hx    Stomach cancer Neg Hx     Social History   Socioeconomic History   Marital status: Widowed    Spouse  name: Gwyndolyn Saxon   Number of children: 2   Years of education: Not on file   Highest education level: Not on file  Occupational History   Occupation: plant sanitation    Comment: [employer]  Tobacco Use   Smoking status: Never   Smokeless tobacco: Never  Vaping Use   Vaping Use: Never used  Substance and Sexual Activity   Alcohol use: No   Drug use: No   Sexual activity: Yes    Partners: Male  Other Topics Concern   Not on file  Social History Narrative   Married x 18 years   2 sons--one son died suddenly in 2006-11-09 with no known cause   Husband passed away last year    Social Determinants of Health   Financial Resource Strain: Not on file  Food Insecurity: Not on file  Transportation Needs: Not on file  Physical Activity: Not on file  Stress: Not on file  Social Connections: Not on file  Intimate Partner Violence: Not on  file    Outpatient Medications Prior to Visit  Medication Sig Dispense Refill   buPROPion (WELLBUTRIN XL) 300 MG 24 hr tablet TAKE 1 TABLET DAILY 90 tablet 3   CALCIUM CITRATE-VITAMIN D PO Take 1 tablet by mouth 3 (three) times daily.     METRONIDAZOLE, TOPICAL, 0.75 % LOTN Apply a small amount to the affected area twice daily. 59 mL 0   Multiple Vitamin (MULTIVITAMIN WITH MINERALS) TABS Take 1 tablet by mouth 2 (two) times daily.     OVER THE COUNTER MEDICATION      Vitamin D, Ergocalciferol, (DRISDOL) 50000 units CAPS capsule TAKE 1 CAPSULE BY MOUTH EVERY 7 DAYS 12 capsule 2   ALPRAZolam (XANAX) 0.5 MG tablet TAKE 1/2 TABLET THREE TIMESDAILY AS NEEDED 90 tablet 0   lisinopril (ZESTRIL) 10 MG tablet TAKE 1 TABLET DAILY 90 tablet 3   sertraline (ZOLOFT) 100 MG tablet TAKE 1 TABLET DAILY 90 tablet 3   metronidazole (NORITATE) 1 % cream Apply topically daily. 60 g 0   Facility-Administered Medications Prior to Visit  Medication Dose Route Frequency Provider Last Rate Last Admin   0.9 %  sodium chloride infusion  500 mL Intravenous Once Jackquline Denmark, MD        No Known Allergies  Review of Systems  Constitutional:  Negative for chills, fever and malaise/fatigue.  HENT:  Negative for congestion, hearing loss, sinus pain and sore throat.   Eyes:  Negative for blurred vision and discharge.  Respiratory:  Negative for cough, sputum production, shortness of breath and wheezing.   Cardiovascular:  Negative for chest pain, palpitations and leg swelling.  Gastrointestinal:  Negative for abdominal pain, blood in stool, constipation, diarrhea, heartburn, nausea and vomiting.  Genitourinary:  Negative for dysuria, frequency, hematuria and urgency.  Musculoskeletal:  Negative for back pain, falls and myalgias.       (-)new muscle pain (-)new joint pain  Skin:  Negative for rash.       (-)New moles  Neurological:  Negative for dizziness, sensory change, loss of consciousness, weakness and  headaches.  Endo/Heme/Allergies:  Negative for environmental allergies. Does not bruise/bleed easily.  Psychiatric/Behavioral:  Negative for depression and suicidal ideas. The patient is not nervous/anxious and does not have insomnia.        Objective:    Physical Exam Vitals and nursing note reviewed.  Constitutional:      General: She is not in acute distress.    Appearance: Normal appearance.  She is well-developed. She is not ill-appearing or diaphoretic.  HENT:     Head: Normocephalic and atraumatic.     Right Ear: Tympanic membrane, ear canal and external ear normal.     Left Ear: Tympanic membrane, ear canal and external ear normal.     Nose: Nose normal.  Eyes:     General:        Right eye: No discharge.        Left eye: No discharge.     Extraocular Movements: Extraocular movements intact.     Conjunctiva/sclera: Conjunctivae normal.     Pupils: Pupils are equal, round, and reactive to light.  Neck:     Thyroid: No thyromegaly.     Vascular: No JVD.  Cardiovascular:     Rate and Rhythm: Normal rate and regular rhythm.     Heart sounds: Murmur heard.     Systolic murmur is present with a grade of 2/6.     No gallop.  Pulmonary:     Effort: Pulmonary effort is normal. No respiratory distress.     Breath sounds: Normal breath sounds. No wheezing or rales.  Chest:     Chest wall: No tenderness.  Abdominal:     General: Bowel sounds are normal. There is no distension.     Palpations: Abdomen is soft. There is no mass.     Tenderness: There is no abdominal tenderness. There is no guarding or rebound.  Genitourinary:    Vagina: Normal. No vaginal discharge.     Rectum: Guaiac result negative.  Musculoskeletal:        General: No tenderness. Normal range of motion.     Cervical back: Normal range of motion and neck supple.  Lymphadenopathy:     Cervical: No cervical adenopathy.  Skin:    General: Skin is warm and dry.     Findings: No erythema or rash.   Neurological:     Mental Status: She is alert and oriented to person, place, and time.     Cranial Nerves: No cranial nerve deficit.     Deep Tendon Reflexes: Reflexes are normal and symmetric.  Psychiatric:        Behavior: Behavior normal.        Thought Content: Thought content normal.        Judgment: Judgment normal.     BP (!) 140/98 (BP Location: Right Arm, Patient Position: Sitting, Cuff Size: Normal)   Pulse 81   Temp 98.3 F (36.8 C) (Oral)   Resp 18   Ht 5' (1.524 m)   Wt 174 lb 9.6 oz (79.2 kg)   LMP 11/08/2010   SpO2 97%   BMI 34.10 kg/m  Wt Readings from Last 3 Encounters:  06/13/22 174 lb 9.6 oz (79.2 kg)  05/09/21 156 lb 9.6 oz (71 kg)  11/11/20 148 lb 12.8 oz (67.5 kg)    Diabetic Foot Exam - Simple   No data filed    Lab Results  Component Value Date   WBC 5.2 05/09/2021   HGB 11.0 (L) 05/09/2021   HCT 35.2 (L) 05/09/2021   PLT 220.0 05/09/2021   GLUCOSE 87 05/09/2021   CHOL 218 (H) 05/09/2021   TRIG 75.0 05/09/2021   HDL 85.20 05/09/2021   LDLCALC 118 (H) 05/09/2021   ALT 16 05/09/2021   AST 23 05/09/2021   NA 139 05/09/2021   K 4.0 05/09/2021   CL 104 05/09/2021   CREATININE 0.68 05/09/2021   BUN 15 05/09/2021  CO2 27 05/09/2021   TSH 0.81 05/09/2021   INR 1.12 03/29/2012    Lab Results  Component Value Date   TSH 0.81 05/09/2021   Lab Results  Component Value Date   WBC 5.2 05/09/2021   HGB 11.0 (L) 05/09/2021   HCT 35.2 (L) 05/09/2021   MCV 83.8 05/09/2021   PLT 220.0 05/09/2021   Lab Results  Component Value Date   NA 139 05/09/2021   K 4.0 05/09/2021   CO2 27 05/09/2021   GLUCOSE 87 05/09/2021   BUN 15 05/09/2021   CREATININE 0.68 05/09/2021   BILITOT 0.4 05/09/2021   ALKPHOS 78 05/09/2021   AST 23 05/09/2021   ALT 16 05/09/2021   PROT 6.4 05/09/2021   ALBUMIN 4.2 05/09/2021   CALCIUM 9.3 05/09/2021   GFR 99.54 05/09/2021   Lab Results  Component Value Date   CHOL 218 (H) 05/09/2021   Lab Results   Component Value Date   HDL 85.20 05/09/2021   Lab Results  Component Value Date   LDLCALC 118 (H) 05/09/2021   Lab Results  Component Value Date   TRIG 75.0 05/09/2021   Lab Results  Component Value Date   CHOLHDL 3 05/09/2021   No results found for: "HGBA1C"  Mammogram- Last completed today at 9 am, 06/13/2022 Dexa- Last completed 06/06/2021. Results showed she is osteoporotic. Repeat in 2 years.  Colonoscopy- Last completed 06/17/2019. Results showed: - Very minimal sigmoid diverticulosis. - Non-bleeding internal hemorrhoids. - Otherwise normal colonoscopy to TI. - Repeat in 10 years.      Assessment & Plan:   Problem List Items Addressed This Visit       Unprioritized   Preventative health care - Primary    Ghm utd Check labs  See AVS       Relevant Orders   CBC with Differential/Platelet   Comprehensive metabolic panel   Lipid panel   TSH   VITAMIN D 25 Hydroxy (Vit-D Deficiency, Fractures)   Morbid obesity ([place])    Start wegovy 0.25 weekly      Relevant Medications   Semaglutide-Weight Management (WEGOVY) 0.25 MG/0.5ML SOAJ   Essential hypertension    Well controlled, no changes to meds. Encouraged heart healthy diet such as the DASH diet and exercise as tolerated.        Relevant Medications   lisinopril (ZESTRIL) 20 MG tablet   Other Relevant Orders   CBC with Differential/Platelet   Comprehensive metabolic panel   Lipid panel   TSH   VITAMIN D 25 Hydroxy (Vit-D Deficiency, Fractures)   Anxiety state    Stable  Con't xanax  D/c zoloft and start lexapro due to hot flashes       Relevant Medications   ALPRAZolam (XANAX) 0.5 MG tablet   escitalopram (LEXAPRO) 20 MG tablet   Other Visit Diagnoses     Generalized anxiety disorder       Relevant Medications   ALPRAZolam (XANAX) 0.5 MG tablet   escitalopram (LEXAPRO) 20 MG tablet   Need for influenza vaccination       Relevant Orders   Flu Vaccine QUAD 59moIM (Fluarix, Fluzone &  Alfiuria Quad PF)   Vitamin D deficiency       Relevant Orders   CBC with Differential/Platelet   Comprehensive metabolic panel   Lipid panel   TSH   VITAMIN D 25 Hydroxy (Vit-D Deficiency, Fractures)   Need for Tdap vaccination       Relevant Orders   Tdap vaccine greater  than or equal to 7yo IM   Cardiac murmur       Relevant Orders   ECHOCARDIOGRAM COMPLETE        Meds ordered this encounter  Medications   ALPRAZolam (XANAX) 0.5 MG tablet    Sig: 1/2 tab tid prn    Dispense:  90 tablet    Refill:  0   escitalopram (LEXAPRO) 20 MG tablet    Sig: Take 1 tablet (20 mg total) by mouth daily.    Dispense:  90 tablet    Refill:  3    D/c zoloft   lisinopril (ZESTRIL) 20 MG tablet    Sig: Take 1 tablet (20 mg total) by mouth daily.    Dispense:  90 tablet    Refill:  3   Semaglutide-Weight Management (WEGOVY) 0.25 MG/0.5ML SOAJ    Sig: Inject 0.25 mg into the skin once a week.    Dispense:  2 mL    Refill:  0    I, Ann Held, DO, personally preformed the services described in this documentation.  All medical record entries made by the scribe were at my direction and in my presence.  I have reviewed the chart and discharge instructions (if applicable) and agree that the record reflects my personal performance and is accurate and complete. 06/13/2022   I,[name],acting as a scribe for Ann Held, DO.,have documented all relevant documentation on the behalf of Ann Held, DO,as directed by  Ann Held, DO while in the presence of Ann Held, DO.   Ann Held, DO

## 2022-06-13 NOTE — Assessment & Plan Note (Signed)
Ghm utd Check labs  See AVS  

## 2022-06-13 NOTE — Assessment & Plan Note (Signed)
Well controlled, no changes to meds. Encouraged heart healthy diet such as the DASH diet and exercise as tolerated.  °

## 2022-06-13 NOTE — Telephone Encounter (Signed)
Evenity BIV submitted

## 2022-06-13 NOTE — Assessment & Plan Note (Signed)
Stable  Con't xanax  D/c zoloft and start lexapro due to hot flashes

## 2022-06-13 NOTE — Telephone Encounter (Signed)
Please check benefits for coverage.

## 2022-06-19 ENCOUNTER — Encounter: Payer: Self-pay | Admitting: Family Medicine

## 2022-06-19 ENCOUNTER — Telehealth: Payer: BC Managed Care – PPO | Admitting: Family Medicine

## 2022-06-19 DIAGNOSIS — U071 COVID-19: Secondary | ICD-10-CM

## 2022-06-19 MED ORDER — NIRMATRELVIR/RITONAVIR (PAXLOVID)TABLET: 3.0000 | ORAL_TABLET | Freq: Two times a day (BID) | ORAL | 0 refills | Status: AC

## 2022-06-19 MED ORDER — BENZONATATE 100 MG PO CAPS: 200.0000 mg | ORAL_CAPSULE | Freq: Two times a day (BID) | ORAL | 0 refills | Status: DC | PRN

## 2022-06-19 NOTE — Progress Notes (Signed)
   Thank you for the details you included in the comment boxes. Those details are very helpful in determining the best course of treatment for you and help [place] to provide the best care.Because COVID _+, we recommend that you convert this visit to a video visit in order for the provider to better assess what is going on.  The provider will be able to give you a more accurate diagnosis and treatment plan if we can more freely discuss your symptoms and with the addition of a virtual examination.   If you convert to a video visit, we will bill your insurance (similar to an office visit) and you will not be charged for this e-Visit. You will be able to stay at home and speak with the first available [facility] advanced practice provider. The link to do a video visit is in the drop down Menu tab of your Welcome screen in [place].

## 2022-06-19 NOTE — Patient Instructions (Signed)
Doylene Canard, thank you for joining Perlie Mayo, NP for today's virtual visit.  While this provider is not your primary care provider (PCP), if your PCP is located in our provider database this encounter information will be shared with them immediately following your visit.   [place] account gives you access to today's visit and all your visits, tests, and labs performed at [facility] " click here if you don't have a [facility] account or go to [email or web address]  Consent: (Patient) Katelyn Bailey provided verbal consent for this virtual visit at the beginning of the encounter.  Current Medications:  Current Outpatient Medications:    benzonatate (TESSALON) 100 MG capsule, Take 2 capsules (200 mg total) by mouth 2 (two) times daily as needed for cough., Disp: 20 capsule, Rfl: 0   nirmatrelvir/ritonavir EUA (PAXLOVID) 20 x 150 MG & 10 x '100MG'$  TABS, Take 3 tablets by mouth 2 (two) times daily for 5 days. (Take nirmatrelvir 150 mg two tablets twice daily for 5 days and ritonavir 100 mg one tablet twice daily for 5 days) Patient GFR is 99, Disp: 30 tablet, Rfl: 0   ALPRAZolam (XANAX) 0.5 MG tablet, 1/2 tab tid prn, Disp: 90 tablet, Rfl: 0   buPROPion (WELLBUTRIN XL) 300 MG 24 hr tablet, TAKE 1 TABLET DAILY, Disp: 90 tablet, Rfl: 3   CALCIUM CITRATE-VITAMIN D PO, Take 1 tablet by mouth 3 (three) times daily., Disp: , Rfl:    escitalopram (LEXAPRO) 20 MG tablet, Take 1 tablet (20 mg total) by mouth daily., Disp: 90 tablet, Rfl: 3   lisinopril (ZESTRIL) 20 MG tablet, Take 1 tablet (20 mg total) by mouth daily., Disp: 90 tablet, Rfl: 3   METRONIDAZOLE, TOPICAL, 0.75 % LOTN, Apply a small amount to the affected area twice daily., Disp: 59 mL, Rfl: 0   Multiple Vitamin (MULTIVITAMIN WITH MINERALS) TABS, Take 1 tablet by mouth 2 (two) times daily., Disp: , Rfl:    OVER THE COUNTER MEDICATION, , Disp: , Rfl:    Semaglutide-Weight Management (WEGOVY) 0.25  MG/0.5ML SOAJ, Inject 0.25 mg into the skin once a week., Disp: 2 mL, Rfl: 0   Vitamin D, Ergocalciferol, (DRISDOL) 50000 units CAPS capsule, TAKE 1 CAPSULE BY MOUTH EVERY 7 DAYS, Disp: 12 capsule, Rfl: 2  Current Facility-Administered Medications:    0.9 %  sodium chloride infusion, 500 mL, Intravenous, Once, Jackquline Denmark, MD   Medications ordered in this encounter:  Meds ordered this encounter  Medications   benzonatate (TESSALON) 100 MG capsule    Sig: Take 2 capsules (200 mg total) by mouth 2 (two) times daily as needed for cough.    Dispense:  20 capsule    Refill:  0    Order Specific Question:   Supervising Provider    Answer:   Chase Picket [id number]   nirmatrelvir/ritonavir EUA (PAXLOVID) 20 x 150 MG & 10 x '100MG'$  TABS    Sig: Take 3 tablets by mouth 2 (two) times daily for 5 days. (Take nirmatrelvir 150 mg two tablets twice daily for 5 days and ritonavir 100 mg one tablet twice daily for 5 days) Patient GFR is 99    Dispense:  30 tablet    Refill:  0    Order Specific Question:   Supervising Provider    Answer:   Chase Picket [id number]     *If you need refills on other medications prior to your next appointment, please contact your pharmacy*  Follow-Up:  Call back or seek an in-person evaluation if the symptoms worsen or if the condition fails to improve as anticipated.  Nunez [phone]  Other Instructions Please keep well-hydrated and get plenty of rest. Start a saline nasal rinse to flush out your nasal passages. You can use plain Mucinex to help thin congestion. If you have a humidifier, running in the bedroom at night. I want you to start OTC vitamin D3 1000 units daily, vitamin C 1000 mg daily, and a zinc supplement. Please take prescribed medications as directed.  You have been enrolled in a MyChart symptom monitoring program. Please answer these questions daily so we can keep track of how you are doing.  You were to quarantine  for 5 days from onset of your symptoms.  After day 5, if you have had no fever and you are feeling better, you can end quarantine but need to mask for an additional 5 days. After day 5 if you have a fever or are having significant symptoms, please quarantine for full 10 days.  If you note any worsening of symptoms, any significant shortness of breath or any chest pain, please seek ER evaluation ASAP.  Please do not delay care!  COVID-19: What to Do if You Are Sick If you test positive and are an older adult or someone who is at high risk of getting very sick from COVID-19, treatment may be available. Contact a healthcare provider right away after a positive test to determine if you are eligible, even if your symptoms are mild right now. You can also visit a Test to Treat location and, if eligible, receive a prescription from a provider. Don't delay: Treatment must be started within the first few days to be effective. If you have a fever, cough, or other symptoms, you might have COVID-19. Most people have mild illness and are able to recover at home. If you are sick: Keep track of your symptoms. If you have an emergency warning sign (including trouble breathing), call 911. Steps to help prevent the spread of COVID-19 if you are sick If you are sick with COVID-19 or think you might have COVID-19, follow the steps below to care for yourself and to help protect other people in your home and community. Stay home except to get medical care Stay home. Most people with COVID-19 have mild illness and can recover at home without medical care. Do not leave your home, except to get medical care. Do not visit public areas and do not go to places where you are unable to wear a mask. Take care of yourself. Get rest and stay hydrated. Take over-the-counter medicines, such as acetaminophen, to help you feel better. Stay in touch with your doctor. Call before you get medical care. Be sure to get care if you have  trouble breathing, or have any other emergency warning signs, or if you think it is an emergency. Avoid public transportation, ride-sharing, or taxis if possible. Get tested If you have symptoms of COVID-19, get tested. While waiting for test results, stay away from others, including staying apart from those living in your household. Get tested as soon as possible after your symptoms start. Treatments may be available for people with COVID-19 who are at risk for becoming very sick. Don't delay: Treatment must be started early to be effective--some treatments must begin within 5 days of your first symptoms. Contact your healthcare provider right away if your test result is positive to determine if you are  eligible. Self-tests are one of several options for testing for the virus that causes COVID-19 and may be more convenient than laboratory-based tests and point-of-care tests. Ask your healthcare provider or your local health department if you need help interpreting your test results. You can visit your state, tribal, local, and territorial health department's website to look for the latest local information on testing sites. Separate yourself from other people As much as possible, stay in a specific room and away from other people and pets in your home. If possible, you should use a separate bathroom. If you need to be around other people or animals in or outside of the home, wear a well-fitting mask. Tell your close contacts that they may have been exposed to COVID-19. An infected person can spread COVID-19 starting 48 hours (or 2 days) before the person has any symptoms or tests positive. By letting your close contacts know they may have been exposed to COVID-19, you are helping to protect everyone. See COVID-19 and Animals if you have questions about pets. If you are diagnosed with COVID-19, someone from the health department may call you. Answer the call to slow the spread. Monitor your  symptoms Symptoms of COVID-19 include fever, cough, or other symptoms. Follow care instructions from your healthcare provider and local health department. Your local health authorities may give instructions on checking your symptoms and reporting information. When to seek emergency medical attention Look for emergency warning signs* for COVID-19. If someone is showing any of these signs, seek emergency medical care immediately: Trouble breathing Persistent pain or pressure in the chest New confusion Inability to wake or stay awake Pale, gray, or blue-colored skin, lips, or nail beds, depending on skin tone *This list is not all possible symptoms. Please call your medical provider for any other symptoms that are severe or concerning to you. Call 911 or call ahead to your local emergency facility: Notify the operator that you are seeking care for someone who has or may have COVID-19. Call ahead before visiting your doctor Call ahead. Many medical visits for routine care are being postponed or done by phone or telemedicine. If you have a medical appointment that cannot be postponed, call your doctor's office, and tell them you have or may have COVID-19. This will help the office protect themselves and other patients. If you are sick, wear a well-fitting mask You should wear a mask if you must be around other people or animals, including pets (even at home). Wear a mask with the best fit, protection, and comfort for you. You don't need to wear the mask if you are alone. If you can't put on a mask (because of trouble breathing, for example), cover your coughs and sneezes in some other way. Try to stay at least 6 feet away from other people. This will help protect the people around you. Masks should not be placed on young children under age 72 years, anyone who has trouble breathing, or anyone who is not able to remove the mask without help. Cover your coughs and sneezes Cover your mouth and nose with  a tissue when you cough or sneeze. Throw away used tissues in a lined trash can. Immediately wash your hands with soap and water for at least 20 seconds. If soap and water are not available, clean your hands with an alcohol-based hand sanitizer that contains at least 60% alcohol. Clean your hands often Wash your hands often with soap and water for at least 20 seconds. This is  especially important after blowing your nose, coughing, or sneezing; going to the bathroom; and before eating or preparing food. Use hand sanitizer if soap and water are not available. Use an alcohol-based hand sanitizer with at least 60% alcohol, covering all surfaces of your hands and rubbing them together until they feel dry. Soap and water are the best option, especially if hands are visibly dirty. Avoid touching your eyes, nose, and mouth with unwashed hands. Handwashing Tips Avoid sharing personal household items Do not share dishes, drinking glasses, cups, eating utensils, towels, or bedding with other people in your home. Wash these items thoroughly after using them with soap and water or put in the dishwasher. Clean surfaces in your home regularly Clean and disinfect high-touch surfaces (for example, doorknobs, tables, handles, light switches, and countertops) in your "sick room" and bathroom. In shared spaces, you should clean and disinfect surfaces and items after each use by the person who is ill. If you are sick and cannot clean, a caregiver or other person should only clean and disinfect the area around you (such as your bedroom and bathroom) on an as needed basis. Your caregiver/other person should wait as long as possible (at least several hours) and wear a mask before entering, cleaning, and disinfecting shared spaces that you use. Clean and disinfect areas that may have blood, stool, or body fluids on them. Use household cleaners and disinfectants. Clean visible dirty surfaces with household cleaners containing  soap or detergent. Then, use a household disinfectant. Use a product from [employer] List N: Disinfectants for Coronavirus (IOXBD-53). Be sure to follow the instructions on the label to ensure safe and effective use of the product. Many products recommend keeping the surface wet with a disinfectant for a certain period of time (look at "contact time" on the product label). You may also need to wear personal protective equipment, such as gloves, depending on the directions on the product label. Immediately after disinfecting, wash your hands with soap and water for 20 seconds. For completed guidance on cleaning and disinfecting your home, visit Complete Disinfection Guidance. Take steps to improve ventilation at home Improve ventilation (air flow) at home to help prevent from spreading COVID-19 to other people in your household. Clear out COVID-19 virus particles in the air by opening windows, using air filters, and turning on fans in your home. Use this interactive tool to learn how to improve air flow in your home. When you can be around others after being sick with COVID-19 Deciding when you can be around others is different for different situations. Find out when you can safely end home isolation. For any additional questions about your care, contact your healthcare provider or state or local health department. 10/19/2020 Content source: [facility] ([facility]), Division of Viral Diseases This information is not intended to replace advice given to you by your health care provider. Make sure you discuss any questions you have with your health care provider. Document Revised: 12/02/2020 Document Reviewed: 12/02/2020 Elsevier Patient Education  2022 [employer].      If you have been instructed to have an in-person evaluation today at a local Urgent Care facility, please use the link below. It will take you to a list of all of our available [facility], including address, phone number and hours of operation. Please do not delay care.  [facility]  If you or a family member do not have a primary care provider, use  the link below to schedule a visit and establish care. When you choose a [facility] primary care physician or advanced practice provider, you gain a long-term partner in health. Find a Primary Care Provider  Learn more about [facility]'s in-office and virtual care options: [facility]

## 2022-06-19 NOTE — Progress Notes (Signed)
Virtual Visit Consent   Katelyn Bailey, you are scheduled for a virtual visit with a [facility] provider today. Just as with appointments in the office, your consent must be obtained to participate. Your consent will be active for this visit and any virtual visit you may have with one of our providers in the next 365 days. If you have a MyChart account, a copy of this consent can be sent to you electronically.  As this is a virtual visit, video technology does not allow for your provider to perform a traditional examination. This may limit your provider's ability to fully assess your condition. If your provider identifies any concerns that need to be evaluated in person or the need to arrange testing (such as labs, EKG, etc.), we will make arrangements to do so. Although advances in technology are sophisticated, we cannot ensure that it will always work on either your end or our end. If the connection with a video visit is poor, the visit may have to be switched to a telephone visit. With either a video or telephone visit, we are not always able to ensure that we have a secure connection.  By engaging in this virtual visit, you consent to the provision of healthcare and authorize for your insurance to be billed (if applicable) for the services provided during this visit. Depending on your insurance coverage, you may receive a charge related to this service.  I need to obtain your verbal consent now. Are you willing to proceed with your visit today? Katelyn Bailey has provided verbal consent on 06/19/2022 for a virtual visit (video or telephone). Perlie Mayo, NP  Date: 06/19/2022 2:01 PM  Virtual Visit via Video Note   I, Perlie Mayo, connected with  Katelyn Bailey  ([id number], September 16, 1965) on 06/19/22 at  2:00 PM EST by a video-enabled telemedicine application and verified that I am speaking with the correct person using two identifiers.  Location: Patient: Virtual Visit Location Patient:  Home Provider: Virtual Visit Location Provider: Home Office   I discussed the limitations of evaluation and management by telemedicine and the availability of in person appointments. The patient expressed understanding and agreed to proceed.    History of Present Illness: Katelyn Bailey is a 56 y.o. who identifies as a female who was assigned female at birth, and is being seen today for covid 26 + on home test. Saturday 06/17/2022 started with cough. Progressed to stuff nose/congested, taste is off and smell, sore throat mild.  Denies fevers and chills, ear pain, chest pain, and shortness of breath. Taking Day Quil and Ny Quil to help with symptoms.  Vaccine- had all but most recent booster.  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 04/05/2011   Anxiety state 12/14/2007   HYPERCHOLESTEROLEMIA 04/03/2007   OVERWEIGHT 04/03/2007   Essential hypertension 04/03/2007   VENOUS INSUFFICIENCY, LEGS 04/03/2007   ACID REFLUX DISEASE 04/03/2007   HIATAL HERNIA WITH REFLUX 04/03/2007    Allergies: No Known Allergies Medications:  Current Outpatient Medications:    ALPRAZolam (XANAX) 0.5 MG tablet, 1/2 tab tid prn, Disp: 90 tablet,  Rfl: 0   buPROPion (WELLBUTRIN XL) 300 MG 24 hr tablet, TAKE 1 TABLET DAILY, Disp: 90 tablet, Rfl: 3   CALCIUM CITRATE-VITAMIN D PO, Take 1 tablet by mouth 3 (three) times daily., Disp: , Rfl:    escitalopram (LEXAPRO) 20 MG tablet, Take 1 tablet (20 mg total) by mouth daily., Disp: 90 tablet, Rfl: 3   lisinopril (ZESTRIL) 20 MG tablet, Take 1 tablet (20 mg total) by mouth  daily., Disp: 90 tablet, Rfl: 3   METRONIDAZOLE, TOPICAL, 0.75 % LOTN, Apply a small amount to the affected area twice daily., Disp: 59 mL, Rfl: 0   Multiple Vitamin (MULTIVITAMIN WITH MINERALS) TABS, Take 1 tablet by mouth 2 (two) times daily., Disp: , Rfl:    OVER THE COUNTER MEDICATION, , Disp: , Rfl:    Semaglutide-Weight Management (WEGOVY) 0.25 MG/0.5ML SOAJ, Inject 0.25 mg into the skin once a week., Disp: 2 mL, Rfl: 0   Vitamin D, Ergocalciferol, (DRISDOL) 50000 units CAPS capsule, TAKE 1 CAPSULE BY MOUTH EVERY 7 DAYS, Disp: 12 capsule, Rfl: 2  Current Facility-Administered Medications:    0.9 %  sodium chloride infusion, 500 mL, Intravenous, Once, Jackquline Denmark, MD  Observations/Objective: Patient is well-developed, well-nourished in no acute distress.  Resting comfortably  at home.  Head is normocephalic, atraumatic.  No labored breathing.  Speech is clear and coherent with logical content.  Patient is alert and oriented at baseline.    Assessment and Plan: 1. COVID-19  - benzonatate (TESSALON) 100 MG capsule; Take 2 capsules (200 mg total) by mouth 2 (two) times daily as needed for cough.  Dispense: 20 capsule; Refill: 0 - nirmatrelvir/ritonavir EUA (PAXLOVID) 20 x 150 MG & 10 x '100MG'$  TABS; Take 3 tablets by mouth 2 (two) times daily for 5 days. (Take nirmatrelvir 150 mg two tablets twice daily for 5 days and ritonavir 100 mg one tablet twice daily for 5 days) Patient GFR is 99  Dispense: 30 tablet; Refill: 0  -S&S consistent with COVID -OTC symptom management -Antiviral provided today -discussed a lot of info on COVID, and put on AVS   Reviewed side effects, risks and benefits of medication.    Patient acknowledged agreement and understanding of the plan.   Past Medical, Surgical, Social History, Allergies, and Medications have been Reviewed.    Follow Up Instructions: I discussed the assessment and treatment plan with the patient. The patient was provided an  opportunity to ask questions and all were answered. The patient agreed with the plan and demonstrated an understanding of the instructions.  A copy of instructions were sent to the patient via MyChart unless otherwise noted below.     The patient was advised to call back or seek an in-person evaluation if the symptoms worsen or if the condition fails to improve as anticipated.  Time:  I spent 10 minutes with the patient via telehealth technology discussing the above problems/concerns.    Perlie Mayo, NP

## 2022-06-25 ENCOUNTER — Telehealth: Payer: Self-pay

## 2022-06-25 NOTE — Telephone Encounter (Addendum)
Prior auth required for EVENITY  PA PROCESS DETAILS: Prior authorization is required. Providers may call [facility] medical specialty drug review team at [phone] to initiate verbally or requests may be submitted online at [email or web address]. Providers may also fax the request to fax number [phone].   Payer Name: [place] Plan Name: [place]

## 2022-06-25 NOTE — Telephone Encounter (Signed)
From: Ann Held, DO  Sent: 06/13/2022  11:21 AM EST  To: Sanda Linger, CMA; Tierra L Creft, CMA   Pt would like to see if her ins will pay for evenity for osteoporosis

## 2022-06-26 ENCOUNTER — Other Ambulatory Visit (HOSPITAL_COMMUNITY): Payer: Self-pay

## 2022-06-26 NOTE — Telephone Encounter (Signed)
Please initiate Evenity PA

## 2022-06-27 NOTE — Telephone Encounter (Signed)
Pharmacy Patient Advocate Encounter  Received notification from [place] that the request for prior authorization for Evenity 105MG/1.17ML syringes has been denied due to coverage criteria not met .     Key: [id number]  Denial letter attached to charts

## 2022-06-27 NOTE — Telephone Encounter (Signed)
Pharmacy Patient Advocate Encounter   Received notification from [place] that prior authorization for Evenity '105MG'$ /1.17ML syringes is required/requested.   PA submitted on 06/27/2022 to [place] via [place]  Status is pending

## 2022-06-28 NOTE — Telephone Encounter (Signed)
Please see denial.

## 2022-07-04 ENCOUNTER — Ambulatory Visit: Payer: BC Managed Care – PPO | Attending: Cardiology

## 2022-07-04 ENCOUNTER — Other Ambulatory Visit (HOSPITAL_COMMUNITY): Payer: Self-pay

## 2022-07-04 ENCOUNTER — Encounter: Payer: Self-pay | Admitting: Family Medicine

## 2022-07-04 DIAGNOSIS — R011 Cardiac murmur, unspecified: Secondary | ICD-10-CM

## 2022-07-04 LAB — ECHOCARDIOGRAM COMPLETE
Area-P 1/2: 3.34 cm2
S' Lateral: 3.2 cm

## 2022-07-04 NOTE — Telephone Encounter (Signed)
Can we resubmit for Prolia? Looks like it is documented that insurance denied coverage 1 year ago, just want to be sure.

## 2022-07-04 NOTE — Telephone Encounter (Signed)
Prolia VOB initiated via [email or web address] 

## 2022-07-05 ENCOUNTER — Other Ambulatory Visit (HOSPITAL_COMMUNITY): Payer: Self-pay

## 2022-07-05 NOTE — Telephone Encounter (Signed)
Pharmacy Patient Advocate Encounter   Received notification from [facility] that prior authorization for Prolia '60MG'$ /ML syringes is required/requested.  PA submitted on 07/05/22 to [place] via CoverMyMeds Key [id number]  Status is pending

## 2022-07-06 ENCOUNTER — Other Ambulatory Visit (HOSPITAL_COMMUNITY): Payer: Self-pay

## 2022-07-06 NOTE — Telephone Encounter (Signed)
Patient Advocate Encounter  Prior Authorization for Prolia '60MG'$ /ML syringes has been approved.    PA# [id number] Key: [id number]  Effective dates: 07/05/22 through 07/06/23

## 2022-07-12 NOTE — Telephone Encounter (Signed)
Change to Andee Poles, CPhT      06/27/22  1:14 PM Note Pharmacy Patient Advocate Encounter   Received notification from [facility] that the request for prior authorization for Evenity 105MG/1.17ML syringes has been denied due to coverage criteria not met .     Key: [id number]   Denial letter attached to charts

## 2022-07-14 ENCOUNTER — Other Ambulatory Visit (HOSPITAL_COMMUNITY): Payer: Self-pay

## 2022-07-14 NOTE — Telephone Encounter (Signed)
Pt ready for scheduling on or after 07/14/22  Out-of-pocket cost due at time of visit: $150  Primary: [place] [employer] - Commercial Prolia co-insurance: 10% (approximately $130) Admin fee co-insurance: 20% (approximately $20)  Secondary:  Prolia co-insurance:  Admin fee co-insurance:   Deductible:   Prior Auth:  PA# [id number]  Valid: 07/05/22 through 07/06/23   ** This summary of benefits is an estimation of the patient's out-of-pocket cost. Exact cost may vary based on individual plan coverage.

## 2022-07-19 NOTE — Telephone Encounter (Signed)
The process for checking on bone building therapy started sometime ago, I see that the pt was just seen last month. Did you all wish to proceed in thi? If so, I can contact her with the cost of the Prolia since Evenity was denied.

## 2022-07-19 NOTE — Telephone Encounter (Signed)
Mychart message sent to the pt.

## 2022-07-21 ENCOUNTER — Ambulatory Visit (INDEPENDENT_AMBULATORY_CARE_PROVIDER_SITE_OTHER): Payer: BC Managed Care – PPO

## 2022-07-21 DIAGNOSIS — M810 Age-related osteoporosis without current pathological fracture: Secondary | ICD-10-CM | POA: Diagnosis not present

## 2022-07-21 MED ORDER — DENOSUMAB 60 MG/ML ~~LOC~~ SOSY
60.0000 mg | PREFILLED_SYRINGE | Freq: Once | SUBCUTANEOUS | Status: AC
  Administered 2022-07-21: 60 mg via SUBCUTANEOUS

## 2022-07-21 NOTE — Progress Notes (Signed)
Pt here today for 1st Prolia injection per Dr. Etter Sjogren.   Prolia '60mg'$ /mL injected into R arm Subq. Pt tolerated well.    Next in 6 months.

## 2022-08-21 ENCOUNTER — Other Ambulatory Visit: Payer: Self-pay | Admitting: Family Medicine

## 2022-08-21 ENCOUNTER — Encounter: Payer: Self-pay | Admitting: Family Medicine

## 2022-08-21 DIAGNOSIS — F411 Generalized anxiety disorder: Secondary | ICD-10-CM

## 2022-08-21 MED ORDER — WEGOVY 0.25 MG/0.5ML ~~LOC~~ SOAJ: 0.2500 mg | SUBCUTANEOUS | 0 refills | Status: DC

## 2022-08-21 NOTE — Telephone Encounter (Signed)
Requesting: Xanax Contract: 2021 UDS: 2021 Last OV: 06/13/2022 Next OV: N/A Last Refill: 06/13/2022, #90--0 RF Database:   Please advise

## 2022-08-21 NOTE — Telephone Encounter (Signed)
Please advise regarding [facility]

## 2022-09-11 ENCOUNTER — Other Ambulatory Visit (HOSPITAL_COMMUNITY): Payer: Self-pay

## 2022-11-28 ENCOUNTER — Other Ambulatory Visit: Payer: Self-pay | Admitting: Family Medicine

## 2022-11-28 DIAGNOSIS — F411 Generalized anxiety disorder: Secondary | ICD-10-CM

## 2022-11-28 NOTE — Telephone Encounter (Signed)
Requesting: Xanax Contract: 2021 UDS: 2021 Last OV: 06/13/2022 Next OV: N/A Last Refill: 08/21/2022, #90--0 RF Database:   Please advise

## 2022-12-29 ENCOUNTER — Encounter: Payer: Self-pay | Admitting: Family Medicine

## 2023-01-01 ENCOUNTER — Telehealth: Payer: Self-pay

## 2023-01-01 NOTE — Telephone Encounter (Signed)
Pt due for next injection on/after 01/20/23  Please initiate EOB.

## 2023-01-08 ENCOUNTER — Other Ambulatory Visit (HOSPITAL_COMMUNITY): Payer: Self-pay

## 2023-01-08 ENCOUNTER — Telehealth: Payer: Self-pay

## 2023-01-08 NOTE — Telephone Encounter (Signed)
Prolia VOB initiated via [email or web address]  Last Prolia inj: 07/21/22 Next Prolia inj DUE:  01/20/23

## 2023-01-08 NOTE — Telephone Encounter (Signed)
New encounter for Prolia BIV created. Will route encounter back once benefit verification is complete

## 2023-01-10 ENCOUNTER — Other Ambulatory Visit (HOSPITAL_COMMUNITY): Payer: Self-pay

## 2023-01-15 ENCOUNTER — Other Ambulatory Visit (HOSPITAL_COMMUNITY): Payer: Self-pay

## 2023-01-15 NOTE — Telephone Encounter (Signed)
Placed a call to [employer] at [phone] to see if a medical prior authorization is required for Prolia.  Per the representative, a prior authorization is not required but may need a pre certification. Faxed clinical information to [phone] and the representative stated we can check the status at [email or web address]  Case ID: [id number]   Specialty pharmacy prior auth is good until 07/06/2023. [facility].

## 2023-01-17 ENCOUNTER — Other Ambulatory Visit (HOSPITAL_COMMUNITY): Payer: Self-pay

## 2023-01-17 NOTE — Telephone Encounter (Signed)
Pt ready for scheduling for Prolia on or after : 01/20/23  Out-of-pocket cost due at time of visit: sSubject to $3200 deductible and 10% coinsurance up to a $3800 out of pocket max  Primary: [employer]BS of [place] - Commercial Prolia co-insurance: 10% Admin fee co-insurance: 10%  Secondary: N/A Prolia co-insurance:  Admin fee co-insurance:   Medical Benefit Details: Date Benefits were checked: 01/08/23 Deductible: $3200 ($4.54 met)/ Coinsurance: 10%/ Admin Fee: 10%  Prior Auth: APPROVED PA# [id number]  Expiration Date: 07/06/2023   Pharmacy benefit: Copay $ HAS TO BE FILLED AT SPECIALTY PHARMACY If patient wants fill through the pharmacy benefit please [facility] prescription [facility]: [facility], and include estimated need by date in rx notes. Pharmacy will ship medication directly to the office.  Patient IS eligible for Prolia Copay Card. Copay Card can make patient's cost as little as $25. Link to apply: https://www.amgensupportplus.com/copay  ** This summary of benefits is an estimation of the patient's out-of-pocket cost. Exact cost may very based on individual plan coverage.

## 2023-01-22 MED ORDER — DENOSUMAB 60 MG/ML ~~LOC~~ SOSY: 60.0000 mg | PREFILLED_SYRINGE | SUBCUTANEOUS | 0 refills | Status: DC

## 2023-01-22 NOTE — Addendum Note (Signed)
Addended by: Thelma Barge D on: 01/22/2023 09:26 AM   Modules accepted: Orders

## 2023-01-22 NOTE — Telephone Encounter (Signed)
Spoke with pt and advised that we will send to specialty pharmacy to see how much price will be.

## 2023-01-23 ENCOUNTER — Encounter: Payer: Self-pay | Admitting: *Deleted

## 2023-01-23 NOTE — Telephone Encounter (Signed)
Pt scheduled for 01/31/23.  Medication should be here on 01/30/23.

## 2023-01-23 NOTE — Telephone Encounter (Signed)
Pt returned call but both CMA's were busy with patients. Please call patient to follow up to discuss

## 2023-01-23 NOTE — Telephone Encounter (Signed)
Spoke with patient and she will register for copay assistance and [facility].  After getting everything situated the patient will call back to schedule.

## 2023-01-23 NOTE — Telephone Encounter (Signed)
Pt scheduled  

## 2023-01-24 ENCOUNTER — Telehealth: Payer: Self-pay

## 2023-01-24 NOTE — Telephone Encounter (Signed)
Pt has  been scheduled for her Prolia NV on 01/31/23.   [facility] called to confirm delivery for 01/30/23. Pt has spoken to the pharmacy and given consent and payment.

## 2023-01-31 ENCOUNTER — Other Ambulatory Visit (HOSPITAL_BASED_OUTPATIENT_CLINIC_OR_DEPARTMENT_OTHER): Payer: Self-pay

## 2023-01-31 ENCOUNTER — Ambulatory Visit (INDEPENDENT_AMBULATORY_CARE_PROVIDER_SITE_OTHER): Payer: BC Managed Care – PPO

## 2023-01-31 ENCOUNTER — Encounter: Payer: Self-pay | Admitting: Family Medicine

## 2023-01-31 ENCOUNTER — Other Ambulatory Visit: Payer: Self-pay

## 2023-01-31 ENCOUNTER — Telehealth: Payer: Self-pay | Admitting: *Deleted

## 2023-01-31 DIAGNOSIS — M810 Age-related osteoporosis without current pathological fracture: Secondary | ICD-10-CM | POA: Diagnosis not present

## 2023-01-31 MED ORDER — DENOSUMAB 60 MG/ML ~~LOC~~ SOSY
60.0000 mg | PREFILLED_SYRINGE | Freq: Once | SUBCUTANEOUS | Status: AC
  Administered 2023-01-31: 60 mg via SUBCUTANEOUS

## 2023-01-31 MED ORDER — SEMAGLUTIDE-WEIGHT MANAGEMENT 0.25 MG/0.5ML ~~LOC~~ SOAJ
0.2500 mg | SUBCUTANEOUS | 0 refills | Status: DC
  Filled 2023-01-31: qty 2, 28d supply, fill #0

## 2023-01-31 NOTE — Telephone Encounter (Signed)
Prolia given today. 

## 2023-01-31 NOTE — Progress Notes (Signed)
Pt here for Prolia injection per [employer] given in left arm Sub-Q . Pt handled well. Pt refused information sheet.   Next in 6 months

## 2023-01-31 NOTE — Telephone Encounter (Signed)
Please advise 

## 2023-02-02 ENCOUNTER — Other Ambulatory Visit: Payer: Self-pay | Admitting: Family Medicine

## 2023-02-02 ENCOUNTER — Other Ambulatory Visit (HOSPITAL_BASED_OUTPATIENT_CLINIC_OR_DEPARTMENT_OTHER): Payer: Self-pay

## 2023-02-02 MED ORDER — WEGOVY 0.25 MG/0.5ML ~~LOC~~ SOAJ
0.2500 mg | SUBCUTANEOUS | 0 refills | Status: DC
  Filled 2023-02-02: qty 2, 28d supply, fill #0

## 2023-04-18 ENCOUNTER — Telehealth: Payer: BC Managed Care – PPO | Admitting: Physician Assistant

## 2023-04-18 DIAGNOSIS — J208 Acute bronchitis due to other specified organisms: Secondary | ICD-10-CM

## 2023-04-18 MED ORDER — BENZONATATE 100 MG PO CAPS
100.0000 mg | ORAL_CAPSULE | Freq: Three times a day (TID) | ORAL | 0 refills | Status: DC | PRN
Start: 2023-04-18 — End: 2024-03-17

## 2023-04-18 NOTE — Progress Notes (Signed)
I have spent 5 minutes in review of e-visit questionnaire, review and updating patient chart, medical decision making and response to patient.   Mia Milan Cody Jacklynn Dehaas, PA-C    

## 2023-04-18 NOTE — Progress Notes (Signed)
E-Visit for Cough  We are sorry that you are not feeling well.  Here is how we plan to help!  Based on your presentation I believe you most likely have A cough due to a virus.  This is called viral bronchitis and is best treated by rest, plenty of fluids and control of the cough.  You may use Ibuprofen or Tylenol as directed to help your symptoms.  I do recommend testing for COVID with a home test as a precaution, giving the high rates of COVID in our area.   In addition you may use A prescription cough medication called Tessalon Perles 100mg . You may take 1-2 capsules every 8 hours as needed for your cough.   From your responses in the eVisit questionnaire you describe inflammation in the upper respiratory tract which is causing a significant cough.  This is commonly called Bronchitis and has four common causes:   Allergies Viral Infections Acid Reflux Bacterial Infection Allergies, viruses and acid reflux are treated by controlling symptoms or eliminating the cause. An example might be a cough caused by taking certain blood pressure medications. You stop the cough by changing the medication. Another example might be a cough caused by acid reflux. Controlling the reflux helps control the cough.  USE OF BRONCHODILATOR ("RESCUE") INHALERS: There is a risk from using your bronchodilator too frequently.  The risk is that over-reliance on a medication which only relaxes the muscles surrounding the breathing tubes can reduce the effectiveness of medications prescribed to reduce swelling and congestion of the tubes themselves.  Although you feel brief relief from the bronchodilator inhaler, your asthma may actually be worsening with the tubes becoming more swollen and filled with mucus.  This can delay other crucial treatments, such as oral steroid medications. If you need to use a bronchodilator inhaler daily, several times per day, you should discuss this with your provider.  There are probably better  treatments that could be used to keep your asthma under control.     HOME CARE Only take medications as instructed by your medical team. Complete the entire course of an antibiotic. Drink plenty of fluids and get plenty of rest. Avoid close contacts especially the very young and the elderly Cover your mouth if you cough or cough into your sleeve. Always remember to wash your hands A steam or ultrasonic humidifier can help congestion.   GET HELP RIGHT AWAY IF: You develop worsening fever. You become short of breath You cough up blood. Your symptoms persist after you have completed your treatment plan MAKE SURE YOU  Understand these instructions. Will watch your condition. Will get help right away if you are not doing well or get worse.    Thank you for choosing an e-visit.  Your e-visit answers were reviewed by a board certified advanced clinical practitioner to complete your personal care plan. Depending upon the condition, your plan could have included both over the counter or prescription medications.  Please review your pharmacy choice. Make sure the pharmacy is open so you can pick up prescription now. If there is a problem, you may contact your provider through [employer] and have the prescription routed to another pharmacy.  Your safety is important to [place]. If you have drug allergies check your prescription carefully.   For the next 24 hours you can use MyChart to ask questions about today's visit, request a non-urgent call back, or ask for a work or school excuse. You will get an email in the  next two days asking about your experience. I hope that your e-visit has been valuable and will speed your recovery.

## 2023-04-27 ENCOUNTER — Other Ambulatory Visit: Payer: Self-pay | Admitting: Family Medicine

## 2023-04-27 DIAGNOSIS — F411 Generalized anxiety disorder: Secondary | ICD-10-CM

## 2023-04-27 MED ORDER — ALPRAZOLAM 0.5 MG PO TABS: ORAL_TABLET | ORAL | 0 refills | Status: DC

## 2023-04-27 NOTE — Telephone Encounter (Signed)
Requesting: alprazolam 0.5mg   Contract: 06/02/20 UDS: 05/06/20 Last Visit: 06/13/22 Next Visit: 06/22/23 Last Refill: 11/29/22 #90 and 0RF   Please Advise

## 2023-05-23 ENCOUNTER — Other Ambulatory Visit: Payer: Self-pay | Admitting: Family Medicine

## 2023-05-23 DIAGNOSIS — Z1231 Encounter for screening mammogram for malignant neoplasm of breast: Secondary | ICD-10-CM

## 2023-05-24 ENCOUNTER — Other Ambulatory Visit: Payer: Self-pay | Admitting: Family Medicine

## 2023-05-24 DIAGNOSIS — F411 Generalized anxiety disorder: Secondary | ICD-10-CM

## 2023-05-24 DIAGNOSIS — I10 Essential (primary) hypertension: Secondary | ICD-10-CM

## 2023-06-22 ENCOUNTER — Ambulatory Visit (INDEPENDENT_AMBULATORY_CARE_PROVIDER_SITE_OTHER): Payer: BC Managed Care – PPO | Admitting: Family Medicine

## 2023-06-22 ENCOUNTER — Ambulatory Visit
Admission: RE | Admit: 2023-06-22 | Discharge: 2023-06-22 | Disposition: A | Discharge status: Home / Self Care | Payer: BC Managed Care – PPO | Source: Ambulatory Visit

## 2023-06-22 ENCOUNTER — Encounter: Payer: Self-pay | Admitting: Family Medicine

## 2023-06-22 VITALS — BP 128/90 | HR 79 | Temp 98.8°F | Resp 16 | Ht 61.0 in | Wt 171.6 lb

## 2023-06-22 DIAGNOSIS — Z1231 Encounter for screening mammogram for malignant neoplasm of breast: Secondary | ICD-10-CM

## 2023-06-22 DIAGNOSIS — M810 Age-related osteoporosis without current pathological fracture: Secondary | ICD-10-CM

## 2023-06-22 DIAGNOSIS — Z0000 Encounter for general adult medical examination without abnormal findings: Secondary | ICD-10-CM | POA: Diagnosis not present

## 2023-06-22 DIAGNOSIS — Z23 Encounter for immunization: Secondary | ICD-10-CM

## 2023-06-22 DIAGNOSIS — E559 Vitamin D deficiency, unspecified: Secondary | ICD-10-CM | POA: Diagnosis not present

## 2023-06-22 DIAGNOSIS — I10 Essential (primary) hypertension: Secondary | ICD-10-CM | POA: Diagnosis not present

## 2023-06-22 LAB — COMPREHENSIVE METABOLIC PANEL
ALT: 8 U/L (ref 0–35)
AST: 13 U/L (ref 0–37)
Albumin: 4.3 g/dL (ref 3.5–5.2)
Alkaline Phosphatase: 68 U/L (ref 39–117)
BUN: 9 mg/dL (ref 6–23)
CO2: 27 meq/L (ref 19–32)
Calcium: 8.6 mg/dL (ref 8.4–10.5)
Chloride: 106 meq/L (ref 96–112)
Creatinine, Ser: 0.69 mg/dL (ref 0.40–1.20)
GFR: 97.73 mL/min (ref >60.00)
Glucose, Bld: 78 mg/dL (ref 70–99)
Potassium: 4.2 meq/L (ref 3.5–5.1)
Sodium: 140 meq/L (ref 135–145)
Total Bilirubin: 0.3 mg/dL (ref 0.2–1.2)
Total Protein: 6.5 g/dL (ref 6.0–8.3)

## 2023-06-22 LAB — HEMOGLOBIN A1C: Hgb A1c MFr Bld: 6.3 % (ref 4.6–6.5)

## 2023-06-22 LAB — CBC WITH DIFFERENTIAL/PLATELET
Basophils Absolute: 0.1 10*3/uL (ref 0.0–0.1)
Basophils Relative: 1.1 % (ref 0.0–3.0)
Eosinophils Absolute: 0.1 10*3/uL (ref 0.0–0.7)
Eosinophils Relative: 1.4 % (ref 0.0–5.0)
HCT: 33.9 % — ABNORMAL LOW (ref 36.0–46.0)
Hemoglobin: 10.2 g/dL — ABNORMAL LOW (ref 12.0–15.0)
Lymphocytes Relative: 32.7 % (ref 12.0–46.0)
Lymphs Abs: 1.7 10*3/uL (ref 0.7–4.0)
MCHC: 30 g/dL (ref 30.0–36.0)
MCV: 75.6 fL — ABNORMAL LOW (ref 78.0–100.0)
Monocytes Absolute: 0.4 10*3/uL (ref 0.1–1.0)
Monocytes Relative: 7.2 % (ref 3.0–12.0)
Neutro Abs: 3.1 10*3/uL (ref 1.4–7.7)
Neutrophils Relative %: 57.6 % (ref 43.0–77.0)
Platelets: 273 10*3/uL (ref 150.0–400.0)
RBC: 4.48 Mil/uL (ref 3.87–5.11)
RDW: 17.9 % — ABNORMAL HIGH (ref 11.5–15.5)
WBC: 5.3 10*3/uL (ref 4.0–10.5)

## 2023-06-22 LAB — TSH: TSH: 0.67 u[IU]/mL (ref 0.35–5.50)

## 2023-06-22 LAB — VITAMIN D 25 HYDROXY (VIT D DEFICIENCY, FRACTURES): VITD: 29.94 ng/mL — ABNORMAL LOW (ref 30.00–100.00)

## 2023-06-22 LAB — LIPID PANEL
Cholesterol: 220 mg/dL — ABNORMAL HIGH (ref 0–200)
HDL: 61.5 mg/dL (ref >39.00)
LDL Cholesterol: 140 mg/dL — ABNORMAL HIGH (ref 0–99)
NonHDL: 158.66
Total CHOL/HDL Ratio: 4
Triglycerides: 95 mg/dL (ref 0.0–149.0)
VLDL: 19 mg/dL (ref 0.0–40.0)

## 2023-06-22 LAB — VITAMIN B12: Vitamin B-12: 233 pg/mL (ref 211–911)

## 2023-06-22 MED ORDER — TIRZEPATIDE-WEIGHT MANAGEMENT 5 MG/0.5ML ~~LOC~~ SOLN
0.5000 mg | SUBCUTANEOUS | 3 refills | Status: AC
Start: 2023-06-22 — End: ?

## 2023-06-22 NOTE — Progress Notes (Signed)
Established Patient Office Visit  Subjective   Patient ID: Katelyn Bailey, female    DOB: 1966-04-16  Age: 57 y.o. MRN: [id number]  Chief Complaint  Patient presents with   Annual Exam    Pt states fasting     HPI The patient, with a history of hypertension, osteoporosis, and obesity, presents for a routine physical examination. She reports no new health issues since the last visit. The patient is currently on a regimen of vitamin D, metronidazole lotion, lisinopril, Lexapro, Prolia, and Wellbutrin. She recently started [facility] for weight management and has lost about six pounds since the beginning of November. However, she expresses concerns about the cost of the medication.  The patient also reports some issues with her osteoporosis treatment, Prolia. She has received two doses so far, with the next one due in January. However, she noticed a discrepancy in the billing for the first dose, which she plans to investigate.  The patient has been managing her hypertension with lisinopril and has an upcoming eye examination scheduled. She reports no new moles or skin changes of concern. She also mentions some joint aches, which she attributes to aging and manages with Tylenol Arthritis.  The patient is also in the process of moving to a new townhome, which has been a significant event in her life recently. She expresses excitement about the move, despite the stress of the process.  Discussed the use of AI scribe software for clinical note transcription with the patient, who gave verbal consent to proceed.  History of Present Illness   The patient, with a history of hypertension, osteoporosis, and obesity, presents for a routine physical examination. She reports no new health issues since the last visit. The patient is currently on a regimen of vitamin D, metronidazole lotion, lisinopril, Lexapro, Prolia, and Wellbutrin. She recently started [facility] for weight management and has lost about six pounds  since the beginning of November. However, she expresses concerns about the cost of the medication.  The patient also reports some issues with her osteoporosis treatment, Prolia. She has received two doses so far, with the next one due in January. However, she noticed a discrepancy in the billing for the first dose, which she plans to investigate.  The patient has been managing her hypertension with lisinopril and has an upcoming eye examination scheduled. She reports no new moles or skin changes of concern. She also mentions some joint aches, which she attributes to aging and manages with Tylenol Arthritis.  The patient is also in the process of moving to a new townhome, which has been a significant event in her life recently. She expresses excitement about the move, despite the stress of the process.                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 04/05/2011   Anxiety state 12/14/2007   HYPERCHOLESTEROLEMIA 04/03/2007   OVERWEIGHT 04/03/2007   Essential hypertension 04/03/2007   VENOUS INSUFFICIENCY, LEGS 04/03/2007   ACID REFLUX DISEASE 04/03/2007   HIATAL HERNIA WITH REFLUX 04/03/2007   Past Medical History:  Diagnosis Date   Acid reflux disease  resolved with wt loss   Anxiety    Cancer (HCC)    SKIN CA   Complication of anesthesia    headaches postop   Dizziness    Essential hypertension    Headache(784.0)    Hiatal hernia    resolved after gastric bypass   Hypercholesterolemia    Kidney stone    Neuralgia    occipital   Occipital neuralgia    Overweight(278.02)    Past  Surgical History:  Procedure Laterality Date   ABDOMINAL HYSTERECTOMY  04/09/2012   Procedure: HYSTERECTOMY ABDOMINAL;  Surgeon: Miguel Aschoff, MD;  Location: [facility];  Service: Gynecology;  Laterality: N/A;   CESAREAN SECTION  1990   ROUX-EN-Y GASTRIC BYPASS  03/20/2011   skin carcinoma  06/21/2012   removed from upper lip   TUBAL LIGATION  1997   Social History   Tobacco Use   Smoking status: Never   Smokeless tobacco: Never  Vaping Use   Vaping status: Never Used  Substance Use Topics   Alcohol use: No   Drug use: No   Social History   Socioeconomic History   Marital status: Widowed    Spouse name: william   Number of children: 2   Years of education: Not on file   Highest education level: Not on file  Occupational History   Occupation: plant sanitation    Comment: [employer]  Tobacco Use   Smoking status: Never   Smokeless tobacco: Never  Vaping Use   Vaping status: Never Used  Substance and Sexual Activity   Alcohol use: No   Drug use: No   Sexual activity: Yes    Partners: Male  Other Topics Concern   Not on file  Social History Narrative   Married x 18 years   2 sons--one son died suddenly in Jul 10, 2007 with no known cause   Husband passed away last year    Social Determinants of Health   Financial Resource Strain: Not on file  Food Insecurity: Not on file  Transportation Needs: Not on file  Physical Activity: Not on file  Stress: Not on file  Social Connections: Not on file  Intimate Partner Violence: Not on file   Family Status  Relation Name Status   Father  Alive       age 16 hx of MI, CABG, smoker   Mother susan mcneil Alive       age 33, hx of breast cancer, HBP, DM, chol   Brother j.david currie Alive       age 84  HBP, chol   PGF  (Not Specified)   Neg Hx  (Not Specified)  No partnership data on file   Family History  Problem Relation Age of Onset   Heart disease Father    Cancer Mother        breast   Arthritis Mother    Diabetes  Brother    Heart disease Paternal Grandfather    Colon cancer Neg Hx    Colon polyps Neg Hx    Esophageal cancer Neg Hx    Rectal cancer Neg Hx    Stomach cancer Neg Hx    No Known Allergies    Review of Systems  Constitutional:  Negative for fever and malaise/fatigue.  HENT:  Negative for congestion.   Eyes:  Negative for blurred vision.  Respiratory:  Negative for cough and shortness of breath.   Cardiovascular:  Negative for chest pain, palpitations and leg swelling.  Gastrointestinal:  Negative  for abdominal pain, blood in stool, nausea and vomiting.  Genitourinary:  Negative for dysuria and frequency.  Musculoskeletal:  Negative for back pain and falls.  Skin:  Negative for rash.  Neurological:  Negative for dizziness, loss of consciousness and headaches.  Endo/Heme/Allergies:  Negative for environmental allergies.  Psychiatric/Behavioral:  Negative for depression. The patient is not nervous/anxious.       Objective:     BP (!) 128/90 (BP Location: Right Arm, Patient Position: Sitting, Cuff Size: Normal)   Pulse 79   Temp 98.8 F (37.1 C) (Oral)   Resp 16   Ht 5\' 1"  (1.549 m)   Wt 171 lb 9.6 oz (77.8 kg)   LMP 11/08/2010   SpO2 99%   BMI 32.42 kg/m  BP Readings from Last 3 Encounters:  06/22/23 (!) 128/90  06/13/22 (!) 140/98  05/09/21 120/80   Wt Readings from Last 3 Encounters:  06/22/23 171 lb 9.6 oz (77.8 kg)  06/13/22 174 lb 9.6 oz (79.2 kg)  05/09/21 156 lb 9.6 oz (71 kg)   SpO2 Readings from Last 3 Encounters:  06/22/23 99%  06/13/22 97%  05/09/21 100%      Physical Exam Vitals and nursing note reviewed.  Constitutional:      General: She is not in acute distress.    Appearance: Normal appearance. She is well-developed.  HENT:     Head: Normocephalic and atraumatic.     Right Ear: Tympanic membrane, ear canal and external ear normal. There is no impacted cerumen.     Left Ear: Tympanic membrane, ear canal and external ear normal. There is  no impacted cerumen.     Nose: Nose normal.     Mouth/Throat:     Mouth: Mucous membranes are moist.     Pharynx: Oropharynx is clear. No oropharyngeal exudate or posterior oropharyngeal erythema.  Eyes:     General: No scleral icterus.       Right eye: No discharge.        Left eye: No discharge.     Conjunctiva/sclera: Conjunctivae normal.     Pupils: Pupils are equal, round, and reactive to light.  Neck:     Thyroid: No thyromegaly or thyroid tenderness.     Vascular: No JVD.  Cardiovascular:     Rate and Rhythm: Normal rate and regular rhythm.     Heart sounds: Normal heart sounds. No murmur heard. Pulmonary:     Effort: Pulmonary effort is normal. No respiratory distress.     Breath sounds: Normal breath sounds.  Abdominal:     General: Bowel sounds are normal. There is no distension.     Palpations: Abdomen is soft. There is no mass.     Tenderness: There is no abdominal tenderness. There is no guarding or rebound.  Musculoskeletal:        General: Normal range of motion.     Cervical back: Normal range of motion and neck supple.     Right lower leg: No edema.     Left lower leg: No edema.  Lymphadenopathy:     Cervical: No cervical adenopathy.  Skin:    General: Skin is warm and dry.     Findings: No erythema or rash.  Neurological:     Mental Status: She is alert and oriented to person, place, and time.     Cranial Nerves: No cranial nerve deficit.     Deep Tendon Reflexes: Reflexes are normal and symmetric.  Psychiatric:  Mood and Affect: Mood normal.        Behavior: Behavior normal.        Thought Content: Thought content normal.        Judgment: Judgment normal.      No results found for any visits on 06/22/23.  Last CBC Lab Results  Component Value Date   WBC 5.2 06/13/2022   HGB 10.0 (L) 06/13/2022   HCT 32.7 (L) 06/13/2022   MCV 76.2 (L) 06/13/2022   MCH 29.0 05/04/2020   RDW 18.1 (H) 06/13/2022   PLT 249.0 06/13/2022   Last metabolic  panel Lab Results  Component Value Date   GLUCOSE 86 06/13/2022   NA 139 06/13/2022   K 4.2 06/13/2022   CL 103 06/13/2022   CO2 29 06/13/2022   BUN 12 06/13/2022   CREATININE 0.66 06/13/2022   GFR 99.50 06/13/2022   CALCIUM 9.0 06/13/2022   PROT 6.6 06/13/2022   ALBUMIN 4.2 06/13/2022   LABGLOB 2.4 02/19/2012   AGRATIO 1.8 02/19/2012   BILITOT 0.4 06/13/2022   ALKPHOS 104 06/13/2022   AST 15 06/13/2022   ALT 12 06/13/2022   Last lipids Lab Results  Component Value Date   CHOL 244 (H) 06/13/2022   HDL 81.80 06/13/2022   LDLCALC 144 (H) 06/13/2022   TRIG 93.0 06/13/2022   CHOLHDL 3 06/13/2022   Last hemoglobin A1c No results found for: "HGBA1C" Last thyroid functions Lab Results  Component Value Date   TSH 0.84 06/13/2022   T4TOTAL 7.3 10/07/2020   Last vitamin D Lab Results  Component Value Date   VD25OH 38.90 06/13/2022   Last vitamin B12 and Folate Lab Results  Component Value Date   VITAMINB12 226 04/04/2018   FOLATE >24.8 02/04/2013      The 10-year ASCVD risk score (Arnett DK, et al., 2019) is: 2.3%    Assessment & Plan:   Problem List Items Addressed This Visit       Unprioritized   Morbid obesity (HCC)   Relevant Medications   tirzepatide 5 MG/0.5ML injection vial   Other Relevant Orders   Hemoglobin A1c   Vitamin B12   VITAMIN D 25 Hydroxy (Vit-D Deficiency, Fractures)   Insulin, random   Preventative health care    .ghm utd Check labs  See AVS Health Maintenance  Topic Date Due   COVID-19 Vaccine (5 - 2023-24 season) 04/01/2023   MAMMOGRAM  06/14/2023   HIV Screening  04/04/2024 (Originally 02/04/1981)   Colonoscopy  06/16/2029   DTaP/Tdap/Td (3 - Td or Tdap) 06/13/2032   INFLUENZA VACCINE  Completed   Hepatitis C Screening  Completed   Zoster Vaccines- Shingrix  Completed   HPV VACCINES  Aged Out         Relevant Orders   CBC with Differential/Platelet   Comprehensive metabolic panel   Lipid panel   TSH   Essential  hypertension    Well controlled, no changes to meds. Encouraged heart healthy diet such as the DASH diet and exercise as tolerated.        Relevant Orders   CBC with Differential/Platelet   Comprehensive metabolic panel   Lipid panel   Other Visit Diagnoses     Need for influenza vaccination    -  Primary   Relevant Orders   Flu vaccine trivalent PF, 6mos and older(Flulaval,Afluria,Fluarix,Fluzone) (Completed)   Vitamin D deficiency       Relevant Orders   VITAMIN D 25 Hydroxy (Vit-D Deficiency, Fractures)   Osteoporosis without  current pathological fracture, unspecified osteoporosis type       Relevant Orders   DG Bone Density     Assessment and Plan    Obesity   Since starting [facility] on November 1st, she has lost 6 pounds but experienced mild nausea initially. She prefers to switch to Zepbound due to its lower cost ($250 for the first two doses) and perceived effectiveness. We will switch to Zepbound and send the prescription to [facility] for payment setup.  Osteoporosis   She has been receiving Prolia injections biannually, with the last injection in July. She reports general aches but no specific adverse effects. The insurance did not cover the first injection, resulting in a $1500 bill. We will administer the next Prolia injection in January, investigate the insurance billing issue, and schedule a bone density scan after two injections.  General Health Maintenance   She is up to date with screenings and vaccinations, with a mammogram completed today. She is due for a bone density scan and maintains regular eye and dental check-ups. We will administer the flu shot, schedule a bone density scan, and continue regular eye and dental check-ups.  Follow-up   We will schedule a bone density scan, follow up with [employer] for Zepbound payment and administration, investigate the Prolia billing issue, and administer the Prolia injection in January.        No follow-ups on file.    Donato Schultz, DO

## 2023-06-22 NOTE — Assessment & Plan Note (Signed)
Well controlled, no changes to meds. Encouraged heart healthy diet such as the DASH diet and exercise as tolerated.  °

## 2023-06-22 NOTE — Assessment & Plan Note (Signed)
.  ghm utd Check labs  See AVS Health Maintenance  Topic Date Due   COVID-19 Vaccine (5 - 2023-24 season) 04/01/2023   MAMMOGRAM  06/14/2023   HIV Screening  04/04/2024 (Originally 02/04/1981)   Colonoscopy  06/16/2029   DTaP/Tdap/Td (3 - Td or Tdap) 06/13/2032   INFLUENZA VACCINE  Completed   Hepatitis C Screening  Completed   Zoster Vaccines- Shingrix  Completed   HPV VACCINES  Aged Out

## 2023-06-25 LAB — INSULIN, RANDOM: Insulin: 3.8 u[IU]/mL

## 2023-07-03 ENCOUNTER — Encounter: Payer: Self-pay | Admitting: Family Medicine

## 2023-07-03 DIAGNOSIS — D509 Iron deficiency anemia, unspecified: Secondary | ICD-10-CM

## 2023-07-04 MED ORDER — VITAMIN D (ERGOCALCIFEROL) 1.25 MG (50000 UNIT) PO CAPS: 50000.0000 [IU] | ORAL_CAPSULE | ORAL | 2 refills | Status: DC

## 2023-07-04 NOTE — Addendum Note (Signed)
Addended by: Roxanne Gates on: 07/04/2023 01:37 PM   Modules accepted: Orders

## 2023-07-05 ENCOUNTER — Ambulatory Visit (HOSPITAL_BASED_OUTPATIENT_CLINIC_OR_DEPARTMENT_OTHER)
Admission: RE | Admit: 2023-07-05 | Discharge: 2023-07-05 | Disposition: A | Discharge status: Home / Self Care | Payer: BC Managed Care – PPO | Source: Ambulatory Visit | Attending: Family Medicine | Admitting: Family Medicine

## 2023-07-05 DIAGNOSIS — M810 Age-related osteoporosis without current pathological fracture: Secondary | ICD-10-CM | POA: Insufficient documentation

## 2023-07-05 DIAGNOSIS — Z780 Asymptomatic menopausal state: Secondary | ICD-10-CM | POA: Diagnosis not present

## 2023-07-06 ENCOUNTER — Telehealth: Payer: Self-pay

## 2023-07-06 DIAGNOSIS — M810 Age-related osteoporosis without current pathological fracture: Secondary | ICD-10-CM

## 2023-07-06 MED ORDER — ROMOSOZUMAB-AQQG 105 MG/1.17ML ~~LOC~~ SOSY
210.0000 mg | PREFILLED_SYRINGE | Freq: Once | SUBCUTANEOUS | Status: DC
Start: 2023-07-20 — End: 2024-05-01

## 2023-07-06 NOTE — Telephone Encounter (Signed)
Dr Laury Axon has asked that we check on Benefits for Evenity for Osteoporosis.  She is on currently on Prolia---  but it has not helped improve her bone density.

## 2023-07-09 ENCOUNTER — Other Ambulatory Visit: Payer: Self-pay | Admitting: Family Medicine

## 2023-07-09 ENCOUNTER — Telehealth: Payer: Self-pay

## 2023-07-09 NOTE — Telephone Encounter (Signed)
Evenity VOB initiated via [email or web address]

## 2023-07-10 ENCOUNTER — Other Ambulatory Visit (HOSPITAL_COMMUNITY): Payer: Self-pay

## 2023-07-10 NOTE — Telephone Encounter (Signed)
Pharmacy Patient Advocate Encounter   Received notification from  [facility] Portal that prior authorization for EVENITY is required/requested.   Insurance verification completed.   The patient is insured through [facility] .   Per test claim: PA required; PA submitted to above mentioned insurance via CoverMyMeds Key/confirmation #/EOC [id number] Status is pending

## 2023-07-11 ENCOUNTER — Encounter: Payer: Self-pay | Admitting: Family Medicine

## 2023-07-11 DIAGNOSIS — F418 Other specified anxiety disorders: Secondary | ICD-10-CM

## 2023-07-11 MED ORDER — VITAMIN D (ERGOCALCIFEROL) 1.25 MG (50000 UNIT) PO CAPS: 50000.0000 [IU] | ORAL_CAPSULE | ORAL | 2 refills | Status: AC

## 2023-07-11 MED ORDER — BUPROPION HCL ER (XL) 300 MG PO TB24: 300.0000 mg | ORAL_TABLET | Freq: Every day | ORAL | 1 refills | Status: DC

## 2023-07-11 NOTE — Addendum Note (Signed)
Addended by: Roxanne Gates on: 07/11/2023 01:51 PM   Modules accepted: Orders

## 2023-07-12 NOTE — Telephone Encounter (Signed)
Pharmacy Patient Advocate Encounter  Received notification from [facility] that Prior Authorization for EVENITY has been DENIED.  Full denial letter will be uploaded to the media tab. See denial reason below.   PA #/Case ID/Reference #: [id number]

## 2023-07-13 ENCOUNTER — Other Ambulatory Visit (INDEPENDENT_AMBULATORY_CARE_PROVIDER_SITE_OTHER): Payer: BC Managed Care – PPO

## 2023-07-13 DIAGNOSIS — D509 Iron deficiency anemia, unspecified: Secondary | ICD-10-CM | POA: Diagnosis not present

## 2023-07-13 LAB — FECAL OCCULT BLOOD, IMMUNOCHEMICAL: Fecal Occult Bld: NEGATIVE

## 2023-07-17 MED ORDER — DENOSUMAB 60 MG/ML ~~LOC~~ SOSY
60.0000 mg | PREFILLED_SYRINGE | Freq: Once | SUBCUTANEOUS | Status: AC
  Administered 2024-05-01: 60 mg via SUBCUTANEOUS

## 2023-07-17 NOTE — Telephone Encounter (Signed)
Prolia given on 01/31/23 Next prolia due 08/04/23

## 2023-07-17 NOTE — Addendum Note (Signed)
Addended by: Thelma Barge D on: 07/17/2023 03:08 PM   Modules accepted: Orders

## 2023-07-18 NOTE — Telephone Encounter (Signed)
Evenity BIV in separate encounter on 07/09/23.

## 2023-07-24 NOTE — Telephone Encounter (Signed)
Checking on appeal for Evenity.

## 2023-07-27 NOTE — Telephone Encounter (Signed)
Checking PA and cost.

## 2023-08-06 ENCOUNTER — Other Ambulatory Visit (HOSPITAL_COMMUNITY): Payer: Self-pay

## 2023-08-07 NOTE — Telephone Encounter (Signed)
 Appeal has been submitted for Evenity . Will advise when response is received, please be advised that most companies may take at least 30 days to make a decision. Appeal letter and supporting documentation have been faxed to [phone] on 08/07/2023 @9 :20 am.  Thank you, Devere Pandy, PharmD Clinical Pharmacist  [place]  Direct Dial: [phone]

## 2023-08-08 NOTE — Telephone Encounter (Signed)
 Dr. Fairy Ruth from [facility] called requesting a Peer to Peer drug review specifically with Dr. Antonio Meth for Evenity .  His direct number is [phone].  He has been provided information regarding the denial received, along with clinical notes from 11.22.24 as well as bone density results from 2022 and 2024, and her current medications.  Please reach out as soon as possible, case will be closed at 11 am tomorrow, 01.09.25.  Thank you, Devere Pandy, PharmD Clinical Pharmacist  [place]  Direct Dial: [phone]

## 2023-08-09 ENCOUNTER — Telehealth: Payer: Self-pay | Admitting: Pharmacist

## 2023-08-09 ENCOUNTER — Other Ambulatory Visit (HOSPITAL_COMMUNITY): Payer: Self-pay

## 2023-08-09 NOTE — Telephone Encounter (Signed)
 The appeal for Evenity was denied with the following reason for [facility]:    Full denial letter will be uploaded under the Media tab.

## 2023-08-09 NOTE — Telephone Encounter (Signed)
 Formulary covers Forteo and [employer].

## 2023-08-13 NOTE — Telephone Encounter (Signed)
 Patient notified about the Forteo and she would like to look into it first and then she will let us know via mychart.Marland Kitchen

## 2023-08-24 ENCOUNTER — Other Ambulatory Visit: Payer: Self-pay | Admitting: Family Medicine

## 2023-08-24 DIAGNOSIS — F411 Generalized anxiety disorder: Secondary | ICD-10-CM

## 2023-08-24 DIAGNOSIS — I10 Essential (primary) hypertension: Secondary | ICD-10-CM

## 2023-10-09 ENCOUNTER — Other Ambulatory Visit (HOSPITAL_COMMUNITY): Payer: Self-pay

## 2023-10-09 ENCOUNTER — Telehealth: Payer: Self-pay

## 2023-10-09 NOTE — Telephone Encounter (Signed)
 Prolia VOB initiated via [email or web address]

## 2023-10-12 ENCOUNTER — Other Ambulatory Visit (HOSPITAL_COMMUNITY): Payer: Self-pay

## 2023-10-12 NOTE — Telephone Encounter (Signed)
 Marland Kitchen

## 2023-10-12 NOTE — Telephone Encounter (Signed)
 Pharmacy Patient Advocate Encounter  Received notification from [facility] that Prior Authorization for PROLIA has been APPROVED from 10/12/23 to 10/11/24   PA #/Case ID/Reference #: [id number]   *PHARMACY BENEFIT*

## 2023-10-23 ENCOUNTER — Other Ambulatory Visit (HOSPITAL_COMMUNITY): Payer: Self-pay

## 2023-10-23 NOTE — Telephone Encounter (Signed)
 Pt ready for scheduling for PROLIA on or after : 10/23/23  Option# 1: Buy/Bill (Office supplied medication)  Out-of-pocket cost due at time of clinic visit: $180 + DEDUCTIBLE  Number of injection/visits approved: 2  Primary: [employer] [place] Prolia co-insurance: 10% Admin fee co-insurance: 10%  Secondary: --- Prolia co-insurance:  Admin fee co-insurance:   Medical Benefit Details: Date Benefits were checked: 10/09/23 Deductible: $0 Met of $3300 Required/ Coinsurance: 10%/ Admin Fee: 10%  Prior Auth: APPROVED PA# [id number] Expiration Date: 10/12/23-10/11/24  # of doses approved: 2 ----------------------------------------------------------------------- Option# 2- Med Obtained from pharmacy:  Pharmacy benefit: Copay $--- MUST FILL AT SPECIALTY (Paid to pharmacy) Admin Fee: $15 (Pay at clinic)  Prior Auth: APPROVED PA# [id number] Expiration Date: 10/12/23-10/11/24  # of doses approved: 2   If patient wants fill through the pharmacy benefit please send prescription to: [facility], and include estimated need by date in rx notes. Pharmacy will ship medication directly to the office.  Patient IS eligible for Prolia Copay Card. Copay Card can make patient's cost as little as $25. Link to apply: [email or web address]://[email or web address].[email or web address]  ** This summary of benefits is an estimation of the patient's out-of-pocket cost. Exact cost may very based on individual plan coverage.

## 2023-10-24 ENCOUNTER — Encounter: Payer: Self-pay | Admitting: *Deleted

## 2023-11-15 ENCOUNTER — Other Ambulatory Visit: Payer: Self-pay | Admitting: Family Medicine

## 2023-11-15 DIAGNOSIS — F411 Generalized anxiety disorder: Secondary | ICD-10-CM

## 2023-11-15 DIAGNOSIS — I10 Essential (primary) hypertension: Secondary | ICD-10-CM

## 2023-12-06 ENCOUNTER — Other Ambulatory Visit (HOSPITAL_COMMUNITY): Payer: Self-pay

## 2023-12-06 NOTE — Telephone Encounter (Addendum)
 Deductible has not been met

## 2023-12-06 NOTE — Telephone Encounter (Signed)
 Checking to see if pharmacy can run test claim for pharmacy to see how much it will cost and if that is high, I will get them to rerun medical since back in March pt had high deductible.

## 2023-12-06 NOTE — Telephone Encounter (Signed)
 Resubmitted via [employer] to see if patient has met deductible.

## 2023-12-07 ENCOUNTER — Encounter (HOSPITAL_COMMUNITY): Payer: Self-pay

## 2023-12-07 ENCOUNTER — Other Ambulatory Visit: Payer: Self-pay

## 2023-12-07 ENCOUNTER — Other Ambulatory Visit: Payer: Self-pay | Admitting: *Deleted

## 2023-12-07 DIAGNOSIS — M810 Age-related osteoporosis without current pathological fracture: Secondary | ICD-10-CM

## 2023-12-07 MED ORDER — DENOSUMAB 60 MG/ML ~~LOC~~ SOSY
60.0000 mg | PREFILLED_SYRINGE | SUBCUTANEOUS | 0 refills | Status: DC
  Filled 2023-12-07: qty 1, 180d supply, fill #0

## 2023-12-12 ENCOUNTER — Other Ambulatory Visit: Payer: Self-pay

## 2023-12-12 MED ORDER — DENOSUMAB 60 MG/ML ~~LOC~~ SOSY: 60.0000 mg | PREFILLED_SYRINGE | SUBCUTANEOUS | 0 refills | Status: AC

## 2023-12-12 NOTE — Addendum Note (Signed)
 Addended by: Marylou Sobers D on: 12/12/2023 01:35 PM   Modules accepted: Orders

## 2023-12-12 NOTE — Progress Notes (Signed)
 Patient must fill Prolia  with [facility]. Office aware. Dis-enrolling

## 2023-12-18 ENCOUNTER — Other Ambulatory Visit: Payer: Self-pay | Admitting: Family Medicine

## 2023-12-18 DIAGNOSIS — F418 Other specified anxiety disorders: Secondary | ICD-10-CM

## 2023-12-20 ENCOUNTER — Other Ambulatory Visit: Payer: Self-pay | Admitting: Family Medicine

## 2023-12-20 DIAGNOSIS — F411 Generalized anxiety disorder: Secondary | ICD-10-CM

## 2023-12-20 NOTE — Telephone Encounter (Signed)
 Requesting: alprazolam  0.5mg   Contract: 05/06/20 UDS: 05/06/20 Last Visit: 06/22/23 Next Visit: None Last Refill: 04/27/23 #90 and 0RF   Please Advise

## 2023-12-20 NOTE — Telephone Encounter (Signed)
 Copied from CRM [id number]. Topic: Clinical - Medication Refill >> Dec 20, 2023  3:37 PM Fonda T wrote: Medication: ALPRAZolam  (XANAX ) 0.5 MG tablet  Has the patient contacted their pharmacy? Yes Patient pharmacy calling for request, Louanna Rouse, Pharmacy Tech with [facility] Ph# [phone] Opt. 2 Reference # [id number]  (Agent: If no, request that the patient contact the pharmacy for the refill. If patient does not wish to contact the pharmacy document the reason why and proceed with request.) (Agent: If yes, when and what did the pharmacy advise?)  This is the patient's preferred pharmacy:  [facility] [place], [place] - [address] AT Portal to [facility] [address] [place] [address] Phone: [phone] Fax: [phone]    Is this the correct pharmacy for this prescription? Yes If no, delete pharmacy and type the correct one.   Has the prescription been filled recently? Yes  Is the patient out of the medication? Caller unsure if patient is out of medication   Has the patient been seen for an appointment in the last year OR does the patient have an upcoming appointment? Yes  Can we respond through MyChart? Yes  Agent: Please be advised that Rx refills may take up to 3 business days. We ask that you follow-up with your pharmacy.

## 2023-12-21 MED ORDER — ALPRAZOLAM 0.5 MG PO TABS: ORAL_TABLET | ORAL | 0 refills | Status: DC

## 2024-02-13 ENCOUNTER — Other Ambulatory Visit: Payer: Self-pay | Admitting: Family Medicine

## 2024-02-13 DIAGNOSIS — F411 Generalized anxiety disorder: Secondary | ICD-10-CM

## 2024-02-13 DIAGNOSIS — I10 Essential (primary) hypertension: Secondary | ICD-10-CM

## 2024-03-03 ENCOUNTER — Telehealth: Admitting: Physician Assistant

## 2024-03-03 DIAGNOSIS — R3989 Other symptoms and signs involving the genitourinary system: Secondary | ICD-10-CM | POA: Diagnosis not present

## 2024-03-04 MED ORDER — NITROFURANTOIN MONOHYD MACRO 100 MG PO CAPS: 100.0000 mg | ORAL_CAPSULE | Freq: Two times a day (BID) | ORAL | 0 refills | Status: DC

## 2024-03-04 NOTE — Progress Notes (Signed)

## 2024-03-10 ENCOUNTER — Encounter: Payer: Self-pay | Admitting: Family Medicine

## 2024-03-10 DIAGNOSIS — I10 Essential (primary) hypertension: Secondary | ICD-10-CM

## 2024-03-11 MED ORDER — LISINOPRIL 20 MG PO TABS: 20.0000 mg | ORAL_TABLET | Freq: Every day | ORAL | 0 refills | Status: DC

## 2024-03-17 ENCOUNTER — Other Ambulatory Visit (HOSPITAL_BASED_OUTPATIENT_CLINIC_OR_DEPARTMENT_OTHER): Payer: Self-pay | Admitting: Family Medicine

## 2024-03-17 ENCOUNTER — Encounter: Payer: Self-pay | Admitting: Family Medicine

## 2024-03-17 ENCOUNTER — Ambulatory Visit (INDEPENDENT_AMBULATORY_CARE_PROVIDER_SITE_OTHER): Admitting: Family Medicine

## 2024-03-17 VITALS — BP 134/88 | HR 73 | Temp 98.3°F | Resp 18 | Ht 61.0 in | Wt 173.2 lb

## 2024-03-17 DIAGNOSIS — R350 Frequency of micturition: Secondary | ICD-10-CM

## 2024-03-17 DIAGNOSIS — Z1231 Encounter for screening mammogram for malignant neoplasm of breast: Secondary | ICD-10-CM

## 2024-03-17 LAB — POC URINALSYSI DIPSTICK (AUTOMATED)
Bilirubin, UA: NEGATIVE
Blood, UA: NEGATIVE
Glucose, UA: NEGATIVE
Ketones, UA: NEGATIVE
Leukocytes, UA: NEGATIVE
Nitrite, UA: NEGATIVE
Protein, UA: NEGATIVE
Spec Grav, UA: <=1.005 — AB (ref 1.010–1.025)
Urobilinogen, UA: 0.2 U/dL
pH, UA: 6.5 (ref 5.0–8.0)

## 2024-03-17 NOTE — Progress Notes (Signed)
 Subjective:    Patient ID: Katelyn Bailey, female    DOB: 05-25-1966, 58 y.o.   MRN: [id number]  Chief Complaint  Patient presents with   Urinary Frequency    Pt had an e visit on 8/04. Pt states taking all medication but still having sxs.     HPI Patient is in today for urinary pressure since last week.  Better today.  Discussed the use of AI scribe software for clinical note transcription with the patient, who gave verbal consent to proceed.  History of Present Illness Katelyn Bailey is a 58 year old female who presents with urinary pressure and frequency.  She has been experiencing a sensation of pressure and increased frequency of urination, similar to an urgent need to urinate, for approximately one week.  She completed an E-visit and was prescribed Macrobid , which she took as directed. Initially, the medication seemed to help, but she continued to experience significant pressure by the end of the course.  She noticed an improvement in her symptoms starting last Friday, with further improvement over the weekend, and reports feeling back to normal at the time of this visit. She describes the sensation as pressure rather than pain. No current pain or discomfort.   Past Medical History:  Diagnosis Date   Acid reflux disease    resolved with wt loss   Anxiety    Cancer (HCC)    SKIN CA   Complication of anesthesia    headaches postop   Dizziness    Essential hypertension    Headache(784.0)    Hiatal hernia    resolved after gastric bypass   Hypercholesterolemia    Kidney stone    Neuralgia    occipital   Occipital neuralgia    Overweight(278.02)     Past Surgical History:  Procedure Laterality Date   ABDOMINAL HYSTERECTOMY  04/09/2012   Procedure: HYSTERECTOMY ABDOMINAL;  Surgeon: Peggye Gull, MD;  Location: [facility];  Service: Gynecology;  Laterality: N/A;   CESAREAN SECTION  1990   ROUX-EN-Y GASTRIC BYPASS  03/20/2011   skin carcinoma  06/21/2012   removed from  upper lip   TUBAL LIGATION  1997    Family History  Problem Relation Age of Onset   Heart disease Father    Cancer Mother        breast   Arthritis Mother    Diabetes Brother    Heart disease Paternal Grandfather    Colon cancer Neg Hx    Colon polyps Neg Hx    Esophageal cancer Neg Hx    Rectal cancer Neg Hx    Stomach cancer Neg Hx     Social History   Socioeconomic History   Marital status: Widowed    Spouse name: william   Number of children: 2   Years of education: Not on file   Highest education level: Associate degree: academic program  Occupational History   Occupation: plant sanitation    Comment: [employer]  Tobacco Use   Smoking status: Never   Smokeless tobacco: Never  Vaping Use   Vaping status: Never Used  Substance and Sexual Activity   Alcohol use: No   Drug use: No   Sexual activity: Yes    Partners: Male  Other Topics Concern   Not on file  Social History Narrative   Married x 18 years   2 sons--one son died suddenly in 04-23-2007 with no known cause   Husband passed away last year  Social Drivers of Corporate investment banker Strain: Low Risk  (03/16/2024)   Overall Financial Resource Strain (CARDIA)    Difficulty of Paying Living Expenses: Not hard at all  Food Insecurity: No Food Insecurity (03/16/2024)   Hunger Vital Sign    Worried About Running Out of Food in the Last Year: Never true    Ran Out of Food in the Last Year: Never true  Transportation Needs: No Transportation Needs (03/16/2024)   PRAPARE - Administrator, Civil Service (Medical): No    Lack of Transportation (Non-Medical): No  Physical Activity: Insufficiently Active (03/16/2024)   Exercise Vital Sign    Days of Exercise per Week: 3 days    Minutes of Exercise per Session: 30 min  Stress: Stress Concern Present (03/16/2024)   [employer] of Occupational Health - Occupational Stress Questionnaire    Feeling of Stress: To some extent  Social Connections:  Socially Isolated (03/16/2024)   Social Connection and Isolation Panel    Frequency of Communication with Friends and Family: More than three times a week    Frequency of Social Gatherings with Friends and Family: Three times a week    Attends Religious Services: Never    Active Member of Clubs or Organizations: No    Attends Banker Meetings: Not on file    Marital Status: Widowed  Catering manager Violence: Not on file    Outpatient Medications Prior to Visit  Medication Sig Dispense Refill   ALPRAZolam  (XANAX ) 0.5 MG tablet TAKE 1/2 TABLET THREE TIMESDAILY AS NEEDED 90 tablet 0   buPROPion  (WELLBUTRIN  XL) 300 MG 24 hr tablet TAKE 1 TABLET DAILY 90 tablet 0   CALCIUM CITRATE-VITAMIN D  PO Take 1 tablet by mouth 3 (three) times daily.     denosumab  (PROLIA ) 60 MG/ML SOSY injection Inject 60 mg into the skin every 6 (six) months. Dx code: M81.0. 1 mL 0   escitalopram  (LEXAPRO ) 20 MG tablet TAKE 1 TABLET DAILY        (DISCONTINUE ZOLOFT ) 90 tablet 0   lisinopril  (ZESTRIL ) 20 MG tablet Take 1 tablet (20 mg total) by mouth daily. 90 tablet 0   METRONIDAZOLE , TOPICAL, 0.75 % LOTN Apply a small amount to the affected area twice daily. 59 mL 0   Multiple Vitamin (MULTIVITAMIN WITH MINERALS) TABS Take 1 tablet by mouth 2 (two) times daily.     OVER THE COUNTER MEDICATION      tirzepatide  5 MG/0.5ML injection vial Inject 0.5 mg into the skin once a week. 0.5 mL 3   Vitamin D , Ergocalciferol , (DRISDOL ) 1.25 MG (50000 UNIT) CAPS capsule Take 1 capsule (50,000 Units total) by mouth every 7 (seven) days. 12 capsule 2   benzonatate  (TESSALON ) 100 MG capsule Take 1 capsule (100 mg total) by mouth 3 (three) times daily as needed for cough. 30 capsule 0   nitrofurantoin , macrocrystal-monohydrate, (MACROBID ) 100 MG capsule Take 1 capsule (100 mg total) by mouth 2 (two) times daily. 10 capsule 0   Facility-Administered Medications Prior to Visit  Medication Dose Route Frequency Provider Last  Rate Last Admin   0.9 %  sodium chloride  infusion  500 mL Intravenous Once Charlanne Groom, MD       denosumab  (PROLIA ) injection 60 mg  60 mg Subcutaneous Once Lowne Chase, Jarmar Rousseau R, DO       Romosozumab -aqqg (EVENITY ) 105 MG/1. injection 210 mg  210 mg Subcutaneous Once Lowne Chase, Anjuli Gemmill R, DO  No Known Allergies  Review of Systems  Constitutional:  Negative for fever.  HENT:  Negative for congestion.   Eyes:  Negative for blurred vision.  Respiratory:  Negative for cough.   Cardiovascular:  Negative for chest pain and palpitations.  Gastrointestinal:  Negative for vomiting.  Genitourinary:  Negative for dysuria, frequency and urgency.  Musculoskeletal:  Negative for back pain.  Skin:  Negative for rash.  Neurological:  Negative for loss of consciousness and headaches.       Objective:    Physical Exam Vitals and nursing note reviewed.  Constitutional:      General: She is not in acute distress.    Appearance: Normal appearance.  HENT:     Head: Normocephalic and atraumatic.     Right Ear: Tympanic membrane, ear canal and external ear normal. There is no impacted cerumen.     Left Ear: Tympanic membrane, ear canal and external ear normal. There is no impacted cerumen.     Nose: Nose normal. No congestion or rhinorrhea.     Right Sinus: No maxillary sinus tenderness or frontal sinus tenderness.     Left Sinus: No maxillary sinus tenderness or frontal sinus tenderness.     Mouth/Throat:     Mouth: Mucous membranes are moist.     Pharynx: Oropharynx is clear. No oropharyngeal exudate or posterior oropharyngeal erythema.  Eyes:     General: No scleral icterus.       Right eye: No discharge.        Left eye: No discharge.     Conjunctiva/sclera: Conjunctivae normal.  Cardiovascular:     Rate and Rhythm: Normal rate and regular rhythm.     Heart sounds: Normal heart sounds.  Pulmonary:     Effort: Pulmonary effort is normal. No respiratory distress.     Breath  sounds: Normal breath sounds.  Abdominal:     Tenderness: There is no abdominal tenderness.  Musculoskeletal:     Cervical back: Normal range of motion.  Lymphadenopathy:     Cervical: No cervical adenopathy.  Skin:    General: Skin is warm and dry.  Neurological:     Mental Status: She is alert and oriented to person, place, and time.  Psychiatric:        Mood and Affect: Mood normal.        Behavior: Behavior normal.        Thought Content: Thought content normal.        Judgment: Judgment normal.     BP 134/88 (BP Location: Left Arm, Patient Position: Sitting, Cuff Size: Large)   Pulse 73   Temp 98.3 F (36.8 C) (Oral)   Resp 18   Ht 5' 1 (1.549 m)   Wt 173 lb 3.2 oz (78.6 kg)   LMP 11/08/2010   SpO2 98%   BMI 32.73 kg/m  Wt Readings from Last 3 Encounters:  03/17/24 173 lb 3.2 oz (78.6 kg)  06/22/23 171 lb 9.6 oz (77.8 kg)  06/13/22 174 lb 9.6 oz (79.2 kg)    Diabetic Foot Exam - Simple   No data filed    Lab Results  Component Value Date   WBC 5.3 06/22/2023   HGB 10.2 (L) 06/22/2023   HCT 33.9 (L) 06/22/2023   PLT 273.0 06/22/2023   GLUCOSE 78 06/22/2023   CHOL 220 (H) 06/22/2023   TRIG 95.0 06/22/2023   HDL 61.50 06/22/2023   LDLCALC 140 (H) 06/22/2023   ALT 8 06/22/2023   AST 13  06/22/2023   NA 140 06/22/2023   K 4.2 06/22/2023   CL 106 06/22/2023   CREATININE 0.69 06/22/2023   BUN 9 06/22/2023   CO2 27 06/22/2023   TSH 0.67 06/22/2023   INR 1.12 03/29/2012   HGBA1C 6.3 06/22/2023    Lab Results  Component Value Date   TSH 0.67 06/22/2023   Lab Results  Component Value Date   WBC 5.3 06/22/2023   HGB 10.2 (L) 06/22/2023   HCT 33.9 (L) 06/22/2023   MCV 75.6 (L) 06/22/2023   PLT 273.0 06/22/2023   Lab Results  Component Value Date   NA 140 06/22/2023   K 4.2 06/22/2023   CO2 27 06/22/2023   GLUCOSE 78 06/22/2023   BUN 9 06/22/2023   CREATININE 0.69 06/22/2023   BILITOT 0.3 06/22/2023   ALKPHOS 68 06/22/2023   AST 13  06/22/2023   ALT 8 06/22/2023   PROT 6.5 06/22/2023   ALBUMIN 4.3 06/22/2023   CALCIUM 8.6 06/22/2023   GFR 97.73 06/22/2023   Lab Results  Component Value Date   CHOL 220 (H) 06/22/2023   Lab Results  Component Value Date   HDL 61.50 06/22/2023   Lab Results  Component Value Date   LDLCALC 140 (H) 06/22/2023   Lab Results  Component Value Date   TRIG 95.0 06/22/2023   Lab Results  Component Value Date   CHOLHDL 4 06/22/2023   Lab Results  Component Value Date   HGBA1C 6.3 06/22/2023       Assessment & Plan:  Urinary frequency -     POCT Urinalysis Dipstick (Automated) -     Urine Culture  Assessment and Plan Assessment & Plan Resolved urinary tract infection   The UTI has resolved. Symptoms began about a week ago with pressure and frequent urination, but no pain. Improvement was noted after starting Macrobid , and by Friday, she felt significantly better. She now reports feeling normal with no symptoms. Release urine culture results on MyChart when available.         Nawaal Alling R Lowne Chase, DO

## 2024-03-18 LAB — URINE CULTURE
MICRO NUMBER:: 16845591
SPECIMEN QUALITY:: ADEQUATE

## 2024-03-19 ENCOUNTER — Ambulatory Visit: Payer: Self-pay | Admitting: Family Medicine

## 2024-03-24 ENCOUNTER — Other Ambulatory Visit: Payer: Self-pay | Admitting: Family Medicine

## 2024-03-24 DIAGNOSIS — F418 Other specified anxiety disorders: Secondary | ICD-10-CM

## 2024-04-01 ENCOUNTER — Telehealth: Payer: Self-pay | Admitting: *Deleted

## 2024-04-01 NOTE — Telephone Encounter (Signed)
 Prolia  received from specialty pharmacy.  Pt is due at anytime.  Patient stated that she was on the way out of town and will call us  when she gets back in town.

## 2024-05-01 ENCOUNTER — Ambulatory Visit: Admitting: *Deleted

## 2024-05-01 DIAGNOSIS — M810 Age-related osteoporosis without current pathological fracture: Secondary | ICD-10-CM

## 2024-05-01 MED ORDER — DENOSUMAB 60 MG/ML ~~LOC~~ SOSY: 60.0000 mg | PREFILLED_SYRINGE | SUBCUTANEOUS | Status: AC

## 2024-05-01 NOTE — Progress Notes (Signed)
 Patient here for Prolia  injection per physicians orders  Prolia  60 mg SQ , was administered right arm today. Patient tolerated injection.  Patient next injection due: 6 months, appt made:  No- will schedule in 5 months after benefits are ran again  Initial injection: no  Did Prolia  come from pharmacy (if yes please select patient supplied): yes  Cam placed for next injection: yes

## 2024-05-01 NOTE — Addendum Note (Signed)
 Addended by: ESTELLE GILLIS D on: 05/01/2024 11:11 AM   Modules accepted: Orders

## 2024-05-27 ENCOUNTER — Other Ambulatory Visit: Payer: Self-pay | Admitting: Family Medicine

## 2024-05-27 DIAGNOSIS — I10 Essential (primary) hypertension: Secondary | ICD-10-CM

## 2024-06-24 ENCOUNTER — Ambulatory Visit (HOSPITAL_BASED_OUTPATIENT_CLINIC_OR_DEPARTMENT_OTHER)
Admission: RE | Admit: 2024-06-24 | Discharge: 2024-06-24 | Disposition: A | Discharge status: Home / Self Care | Source: Ambulatory Visit | Attending: Family Medicine | Admitting: Family Medicine

## 2024-06-24 ENCOUNTER — Ambulatory Visit (INDEPENDENT_AMBULATORY_CARE_PROVIDER_SITE_OTHER): Admitting: Family Medicine

## 2024-06-24 ENCOUNTER — Encounter (HOSPITAL_BASED_OUTPATIENT_CLINIC_OR_DEPARTMENT_OTHER): Payer: Self-pay

## 2024-06-24 ENCOUNTER — Encounter: Payer: Self-pay | Admitting: Family Medicine

## 2024-06-24 VITALS — BP 140/100 | HR 74 | Temp 98.3°F | Resp 16 | Ht 61.0 in | Wt 170.8 lb

## 2024-06-24 DIAGNOSIS — F411 Generalized anxiety disorder: Secondary | ICD-10-CM | POA: Diagnosis not present

## 2024-06-24 DIAGNOSIS — Z1231 Encounter for screening mammogram for malignant neoplasm of breast: Secondary | ICD-10-CM | POA: Diagnosis not present

## 2024-06-24 DIAGNOSIS — Z0000 Encounter for general adult medical examination without abnormal findings: Secondary | ICD-10-CM

## 2024-06-24 DIAGNOSIS — F418 Other specified anxiety disorders: Secondary | ICD-10-CM | POA: Diagnosis not present

## 2024-06-24 DIAGNOSIS — I10 Essential (primary) hypertension: Secondary | ICD-10-CM | POA: Diagnosis not present

## 2024-06-24 DIAGNOSIS — D508 Other iron deficiency anemias: Secondary | ICD-10-CM | POA: Diagnosis not present

## 2024-06-24 DIAGNOSIS — Z13220 Encounter for screening for lipoid disorders: Secondary | ICD-10-CM

## 2024-06-24 DIAGNOSIS — Z23 Encounter for immunization: Secondary | ICD-10-CM | POA: Diagnosis not present

## 2024-06-24 DIAGNOSIS — Z1211 Encounter for screening for malignant neoplasm of colon: Secondary | ICD-10-CM

## 2024-06-24 LAB — COMPREHENSIVE METABOLIC PANEL WITH GFR
ALT: 10 U/L (ref 0–35)
AST: 13 U/L (ref 0–37)
Albumin: 4.1 g/dL (ref 3.5–5.2)
Alkaline Phosphatase: 86 U/L (ref 39–117)
BUN: 12 mg/dL (ref 6–23)
CO2: 29 meq/L (ref 19–32)
Calcium: 8.4 mg/dL (ref 8.4–10.5)
Chloride: 107 meq/L (ref 96–112)
Creatinine, Ser: 0.65 mg/dL (ref 0.40–1.20)
GFR: 98.45 mL/min (ref >60.00)
Glucose, Bld: 88 mg/dL (ref 70–99)
Potassium: 4.1 meq/L (ref 3.5–5.1)
Sodium: 140 meq/L (ref 135–145)
Total Bilirubin: 0.4 mg/dL (ref 0.2–1.2)
Total Protein: 6.2 g/dL (ref 6.0–8.3)

## 2024-06-24 LAB — TSH: TSH: 1.03 u[IU]/mL (ref 0.35–5.50)

## 2024-06-24 LAB — CBC WITH DIFFERENTIAL/PLATELET
Basophils Absolute: 0.1 K/uL (ref 0.0–0.1)
Basophils Relative: 1.4 % (ref 0.0–3.0)
Eosinophils Absolute: 0.1 K/uL (ref 0.0–0.7)
Eosinophils Relative: 1.3 % (ref 0.0–5.0)
HCT: 35.3 % — ABNORMAL LOW (ref 36.0–46.0)
Hemoglobin: 11.4 g/dL — ABNORMAL LOW (ref 12.0–15.0)
Lymphocytes Relative: 38.5 % (ref 12.0–46.0)
Lymphs Abs: 1.8 K/uL (ref 0.7–4.0)
MCHC: 32.2 g/dL (ref 30.0–36.0)
MCV: 82 fl (ref 78.0–100.0)
Monocytes Absolute: 0.4 K/uL (ref 0.1–1.0)
Monocytes Relative: 8.1 % (ref 3.0–12.0)
Neutro Abs: 2.3 K/uL (ref 1.4–7.7)
Neutrophils Relative %: 50.7 % (ref 43.0–77.0)
Platelets: 207 K/uL (ref 150.0–400.0)
RBC: 4.3 Mil/uL (ref 3.87–5.11)
RDW: 16.6 % — ABNORMAL HIGH (ref 11.5–15.5)
WBC: 4.6 K/uL (ref 4.0–10.5)

## 2024-06-24 LAB — LIPID PANEL
Cholesterol: 206 mg/dL — ABNORMAL HIGH (ref 0–200)
HDL: 75.2 mg/dL (ref >39.00)
LDL Cholesterol: 111 mg/dL — ABNORMAL HIGH (ref 0–99)
NonHDL: 130.66
Total CHOL/HDL Ratio: 3
Triglycerides: 97 mg/dL (ref 0.0–149.0)
VLDL: 19.4 mg/dL (ref 0.0–40.0)

## 2024-06-24 LAB — IRON,TIBC AND FERRITIN PANEL
%SAT: 21 % (ref 16–45)
Ferritin: 6 ng/mL — ABNORMAL LOW (ref 16–232)
Iron: 94 ug/dL (ref 45–160)
TIBC: 451 ug/dL — ABNORMAL HIGH (ref 250–450)

## 2024-06-24 MED ORDER — BUPROPION HCL ER (XL) 300 MG PO TB24: 300.0000 mg | ORAL_TABLET | Freq: Every day | ORAL | 3 refills | Status: AC

## 2024-06-24 MED ORDER — ALPRAZOLAM 0.5 MG PO TABS: ORAL_TABLET | ORAL | 0 refills | Status: AC

## 2024-06-24 MED ORDER — LISINOPRIL 40 MG PO TABS: 40.0000 mg | ORAL_TABLET | Freq: Every day | ORAL | 3 refills | Status: AC

## 2024-06-24 NOTE — Progress Notes (Signed)
 Subjective:    Patient ID: Katelyn Bailey, female    DOB: June 10, 1966, 58 y.o.   MRN: [id number]  Chief Complaint  Patient presents with   Annual Exam    Pt states fasting     HPI Patient is in today for cpe.  Discussed the use of AI scribe software for clinical note transcription with the patient, who gave verbal consent to proceed.  History of Present Illness Katelyn Bailey is a 58 year old female with hypertension and osteoporosis who presents for an annual physical exam.  Her blood pressure has been elevated recently, with today's reading at 140/100 mmHg. She notes that her diastolic pressure has been running high, around 90-92 mmHg, on occasion. She is currently taking lisinopril  and has found an extra bottle of it at home. Her medication regimen also includes bupropion  and alprazolam .  She has been receiving Prolia  injections for osteoporosis, with four injections completed so far. Her insurance did not approve Evenity  previously. She takes calcium and vitamin D , although she struggles with the vitamin D  regimen due to insurance issues. She also takes iron supplements but notes that they upset her stomach. She experiences occasional joint pain.  Her mother is doing better after the passing of her stepfather, who was a chartered loss adjuster. Her mother now lives alone and is happier, although sometimes lonely. Her father, who has a history of heart surgery and alcoholism, is alive, and she sees him once a year.  She works as a scientist, water quality at [employer] and recently bought a townhome in [place]. She walks her dog several times a day, which provides her with some exercise. She used to be more active before her husband passed away from COVID-19 in 08/01/20.  No blood in the stool, no new surgeries, and occasional joint pain. She experiences anxiety, which is managed with medication. She has not had a gynecological visit recently due to a previous hysterectomy. She has not received a flu shot yet this  year but plans to get it today. She has not received any COVID-19 vaccinations recently.    Past Medical History:  Diagnosis Date   Acid reflux disease    resolved with wt loss   Anxiety    Cancer (HCC)    SKIN CA   Complication of anesthesia    headaches postop   Dizziness    Essential hypertension    Headache(784.0)    Hiatal hernia    resolved after gastric bypass   Hypercholesterolemia    Kidney stone    Neuralgia    occipital   Occipital neuralgia    Overweight(278.02)     Past Surgical History:  Procedure Laterality Date   ABDOMINAL HYSTERECTOMY  04/09/2012   Procedure: HYSTERECTOMY ABDOMINAL;  Surgeon: Peggye Gull, MD;  Location: [facility];  Service: Gynecology;  Laterality: N/A;   CESAREAN SECTION  1990   ROUX-EN-Y GASTRIC BYPASS  03/20/2011   skin carcinoma  06/21/2012   removed from upper lip   TUBAL LIGATION  1997    Family History  Problem Relation Age of Onset   Cancer Mother        breast   Arthritis Mother    Heart disease Father    Alcohol abuse Father    Diabetes Brother    Heart disease Paternal Grandfather    Colon cancer Neg Hx    Colon polyps Neg Hx    Esophageal cancer Neg Hx    Rectal cancer Neg Hx    Stomach  cancer Neg Hx     Social History   Socioeconomic History   Marital status: Widowed    Spouse name: william   Number of children: 2   Years of education: Not on file   Highest education level: Associate degree: academic program  Occupational History   Occupation: plant sanitation    Comment: [employer]  Tobacco Use   Smoking status: Never   Smokeless tobacco: Never  Vaping Use   Vaping status: Never Used  Substance and Sexual Activity   Alcohol use: No   Drug use: No   Sexual activity: Yes    Partners: Male  Other Topics Concern   Not on file  Social History Narrative   Married x 18 years   2 sons--one son died suddenly in 21-Jul-2007 with no known cause   Husband passed away last year    Social Drivers of Manufacturing Engineer Strain: Low Risk  (06/23/2024)   Overall Financial Resource Strain (CARDIA)    Difficulty of Paying Living Expenses: Not hard at all  Food Insecurity: No Food Insecurity (06/23/2024)   Hunger Vital Sign    Worried About Running Out of Food in the Last Year: Never true    Ran Out of Food in the Last Year: Never true  Transportation Needs: No Transportation Needs (06/23/2024)   PRAPARE - Administrator, Civil Service (Medical): No    Lack of Transportation (Non-Medical): No  Physical Activity: Insufficiently Active (06/23/2024)   Exercise Vital Sign    Days of Exercise per Week: 4 days    Minutes of Exercise per Session: 20 min  Stress: Stress Concern Present (06/23/2024)   [employer] of Occupational Health - Occupational Stress Questionnaire    Feeling of Stress: Rather much  Social Connections: Moderately Isolated (06/23/2024)   Social Connection and Isolation Panel    Frequency of Communication with Friends and Family: More than three times a week    Frequency of Social Gatherings with Friends and Family: Three times a week    Attends Religious Services: 1 to 4 times per year    Active Member of Clubs or Organizations: No    Attends Banker Meetings: Not on file    Marital Status: Widowed  Intimate Partner Violence: Not on file    Outpatient Medications Prior to Visit  Medication Sig Dispense Refill   CALCIUM CITRATE-VITAMIN D  PO Take 1 tablet by mouth 3 (three) times daily.     denosumab  (PROLIA ) 60 MG/ML SOSY injection Inject 60 mg into the skin every 6 (six) months. Dx code: M81.0. 1 mL 0   escitalopram  (LEXAPRO ) 20 MG tablet TAKE 1 TABLET DAILY        (DISCONTINUE ZOLOFT ) 90 tablet 0   METRONIDAZOLE , TOPICAL, 0.75 % LOTN Apply a small amount to the affected area twice daily. 59 mL 0   Multiple Vitamin (MULTIVITAMIN WITH MINERALS) TABS Take 1 tablet by mouth 2 (two) times daily.     OVER THE COUNTER MEDICATION       tirzepatide  5 MG/0.5ML injection vial Inject 0.5 mg into the skin once a week. 0.5 mL 3   Vitamin D , Ergocalciferol , (DRISDOL ) 1.25 MG (50000 UNIT) CAPS capsule Take 1 capsule (50,000 Units total) by mouth every 7 (seven) days. 12 capsule 2   ALPRAZolam  (XANAX ) 0.5 MG tablet TAKE 1/2 TABLET THREE TIMESDAILY AS NEEDED 90 tablet 0   buPROPion  (WELLBUTRIN  XL) 300 MG 24 hr tablet TAKE 1 TABLET  DAILY 90 tablet 1   lisinopril  (ZESTRIL ) 20 MG tablet TAKE 1 TABLET DAILY 90 tablet 0   Facility-Administered Medications Prior to Visit  Medication Dose Route Frequency Provider Last Rate Last Admin   0.9 %  sodium chloride  infusion  500 mL Intravenous Once Gupta, Rajesh, MD       [START ON 10/28/2024] denosumab  (PROLIA ) injection 60 mg  60 mg Subcutaneous Q6 months Antonio Meth, Iker Nuttall R, DO        No Known Allergies  Review of Systems  Constitutional:  Negative for chills, fever and malaise/fatigue.  HENT:  Negative for congestion and hearing loss.   Eyes:  Negative for blurred vision and discharge.  Respiratory:  Negative for cough, sputum production and shortness of breath.   Cardiovascular:  Negative for chest pain, palpitations and leg swelling.  Gastrointestinal:  Negative for abdominal pain, blood in stool, constipation, diarrhea, heartburn, nausea and vomiting.  Genitourinary:  Negative for dysuria, frequency, hematuria and urgency.  Musculoskeletal:  Negative for back pain, falls and myalgias.  Skin:  Negative for rash.  Neurological:  Negative for dizziness, sensory change, loss of consciousness, weakness and headaches.  Endo/Heme/Allergies:  Negative for environmental allergies. Does not bruise/bleed easily.  Psychiatric/Behavioral:  Negative for depression and suicidal ideas. The patient is not nervous/anxious and does not have insomnia.        Objective:    Physical Exam Vitals and nursing note reviewed.  Constitutional:      General: She is not in acute distress.    Appearance:  Normal appearance. She is well-developed.  HENT:     Head: Normocephalic and atraumatic.     Right Ear: Tympanic membrane, ear canal and external ear normal. There is no impacted cerumen.     Left Ear: Tympanic membrane, ear canal and external ear normal. There is no impacted cerumen.     Nose: Nose normal.     Mouth/Throat:     Mouth: Mucous membranes are moist.     Pharynx: Oropharynx is clear. No oropharyngeal exudate or posterior oropharyngeal erythema.  Eyes:     General: No scleral icterus.       Right eye: No discharge.        Left eye: No discharge.     Conjunctiva/sclera: Conjunctivae normal.     Pupils: Pupils are equal, round, and reactive to light.  Neck:     Thyroid : No thyromegaly or thyroid  tenderness.     Vascular: No JVD.  Cardiovascular:     Rate and Rhythm: Normal rate and regular rhythm.     Heart sounds: Normal heart sounds. No murmur heard. Pulmonary:     Effort: Pulmonary effort is normal. No respiratory distress.     Breath sounds: Normal breath sounds.  Abdominal:     General: Bowel sounds are normal. There is no distension.     Palpations: Abdomen is soft. There is no mass.     Tenderness: There is no abdominal tenderness. There is no guarding or rebound.  Genitourinary:    Vagina: Normal.  Musculoskeletal:        General: Normal range of motion.     Cervical back: Normal range of motion and neck supple.     Right lower leg: No edema.     Left lower leg: No edema.  Lymphadenopathy:     Cervical: No cervical adenopathy.  Skin:    General: Skin is warm and dry.     Findings: No erythema or rash.  Neurological:  Mental Status: She is alert and oriented to person, place, and time.     Cranial Nerves: No cranial nerve deficit.     Deep Tendon Reflexes: Reflexes are normal and symmetric.  Psychiatric:        Mood and Affect: Mood normal.        Behavior: Behavior normal.        Thought Content: Thought content normal.        Judgment: Judgment  normal.     BP (!) 140/100 (BP Location: Left Arm, Patient Position: Sitting, Cuff Size: Normal)   Pulse 74   Temp 98.3 F (36.8 C) (Oral)   Resp 16   Ht 5' 1 (1.549 m)   Wt 170 lb 12.8 oz (77.5 kg)   LMP 11/08/2010   SpO2 100%   BMI 32.27 kg/m  Wt Readings from Last 3 Encounters:  06/24/24 170 lb 12.8 oz (77.5 kg)  03/17/24 173 lb 3.2 oz (78.6 kg)  06/22/23 171 lb 9.6 oz (77.8 kg)    Diabetic Foot Exam - Simple   No data filed    Lab Results  Component Value Date   WBC 5.3 06/22/2023   HGB 10.2 (L) 06/22/2023   HCT 33.9 (L) 06/22/2023   PLT 273.0 06/22/2023   GLUCOSE 78 06/22/2023   CHOL 220 (H) 06/22/2023   TRIG 95.0 06/22/2023   HDL 61.50 06/22/2023   LDLCALC 140 (H) 06/22/2023   ALT 8 06/22/2023   AST 13 06/22/2023   NA 140 06/22/2023   K 4.2 06/22/2023   CL 106 06/22/2023   CREATININE 0.69 06/22/2023   BUN 9 06/22/2023   CO2 27 06/22/2023   TSH 0.67 06/22/2023   INR 1.12 03/29/2012   HGBA1C 6.3 06/22/2023    Lab Results  Component Value Date   TSH 0.67 06/22/2023   Lab Results  Component Value Date   WBC 5.3 06/22/2023   HGB 10.2 (L) 06/22/2023   HCT 33.9 (L) 06/22/2023   MCV 75.6 (L) 06/22/2023   PLT 273.0 06/22/2023   Lab Results  Component Value Date   NA 140 06/22/2023   K 4.2 06/22/2023   CO2 27 06/22/2023   GLUCOSE 78 06/22/2023   BUN 9 06/22/2023   CREATININE 0.69 06/22/2023   BILITOT 0.3 06/22/2023   ALKPHOS 68 06/22/2023   AST 13 06/22/2023   ALT 8 06/22/2023   PROT 6.5 06/22/2023   ALBUMIN 4.3 06/22/2023   CALCIUM 8.6 06/22/2023   GFR 97.73 06/22/2023   Lab Results  Component Value Date   CHOL 220 (H) 06/22/2023   Lab Results  Component Value Date   HDL 61.50 06/22/2023   Lab Results  Component Value Date   LDLCALC 140 (H) 06/22/2023   Lab Results  Component Value Date   TRIG 95.0 06/22/2023   Lab Results  Component Value Date   CHOLHDL 4 06/22/2023   Lab Results  Component Value Date   HGBA1C 6.3  06/22/2023       Assessment & Plan:  Preventative health care Assessment & Plan: Ghm utd Check labs  See AVS  Health Maintenance  Topic Date Due   HIV Screening  Never done   Hepatitis B Vaccines 19-59 Average Risk (1 of 3 - 19+ 3-dose series) Never done   Pneumococcal Vaccine: 50+ Years (1 of 1 - PCV) Never done   Influenza Vaccine  02/29/2024   Mammogram  06/21/2024   COVID-19 Vaccine (5 - 2025-26 season) 07/10/2024 (Originally 03/31/2024)  Colonoscopy  06/16/2029   DTaP/Tdap/Td (3 - Td or Tdap) 06/13/2032   Hepatitis C Screening  Completed   Zoster Vaccines- Shingrix   Completed   HPV VACCINES  Aged Out   Meningococcal B Vaccine  Aged Out     Orders: -     CBC with Differential/Platelet -     Comprehensive metabolic panel with GFR -     Lipid panel -     TSH  Depression with anxiety -     buPROPion  HCl ER (XL); Take 1 tablet (300 mg total) by mouth daily.  Dispense: 90 tablet; Refill: 3  Generalized anxiety disorder -     ALPRAZolam ; TAKE 1/2 TABLET THREE TIMESDAILY AS NEEDED  Dispense: 90 tablet; Refill: 0  Essential hypertension Assessment & Plan: Poorly controlled will alter medications, encouraged DASH diet, minimize caffeine and obtain adequate sleep. Report concerning symptoms and follow up as directed and as needed  Inc lisinopril  40 mg daily   Orders: -     Lisinopril ; Take 1 tablet (40 mg total) by mouth daily.  Dispense: 90 tablet; Refill: 3  Need for influenza vaccination -     Flu vaccine trivalent PF, 6mos and older(Flulaval,Afluria,Fluarix,Fluzone)  Iron deficiency anemia secondary to inadequate dietary iron intake -     Iron, TIBC and Ferritin Panel  Colon cancer screening -     Fecal occult blood, imunochemical; Future  Assessment and Plan Assessment & Plan Adult Wellness Visit   Routine adult wellness visit with elevated blood pressure at 140/100 mmHg. No new surgeries or significant changes in family history. No gynecological follow-up  needed due to hysterectomy. She has not received COVID vaccinations this year and has not yet received a flu shot, which was administered today. Ordered stool test for occult blood and labs for routine screening.  Essential hypertension   Blood pressure elevated at 140/100 mmHg, with previous readings occasionally elevated but not consistently high. Lisinopril  is available for use. Rechecked blood pressure before leaving. Will consider increasing lisinopril  if blood pressure remains elevated.  Osteoporosis   Currently on Prolia  with four injections. Insurance did not approve Evenity  previously, and bone density has not improved. Discussed potential for insurance coverage of Evenity . Will check with insurance every six months for coverage. Continue weight-bearing exercises and walking. Ensure adequate calcium and vitamin D  intake.  Iron deficiency anemia   Iron levels previously low. Currently taking iron supplements despite gastrointestinal side effects. No blood in stool reported. Ordered labs to check iron levels.  Generalized anxiety disorder   Anxiety managed with alprazolam . Stress related to fiscal year-end noted. Refilled alprazolam  prescription.    Jahari Billy R Lowne Chase, DO

## 2024-06-24 NOTE — Assessment & Plan Note (Signed)
 Ghm utd Check labs  See AVS  Health Maintenance  Topic Date Due   HIV Screening  Never done   Hepatitis B Vaccines 19-59 Average Risk (1 of 3 - 19+ 3-dose series) Never done   Pneumococcal Vaccine: 50+ Years (1 of 1 - PCV) Never done   Influenza Vaccine  02/29/2024   Mammogram  06/21/2024   COVID-19 Vaccine (5 - 2025-26 season) 07/10/2024 (Originally 03/31/2024)   Colonoscopy  06/16/2029   DTaP/Tdap/Td (3 - Td or Tdap) 06/13/2032   Hepatitis C Screening  Completed   Zoster Vaccines- Shingrix   Completed   HPV VACCINES  Aged Out   Meningococcal B Vaccine  Aged Out

## 2024-06-24 NOTE — Assessment & Plan Note (Addendum)
 Poorly controlled will alter medications, encouraged DASH diet, minimize caffeine and obtain adequate sleep. Report concerning symptoms and follow up as directed and as needed  Inc lisinopril  40 mg daily

## 2024-07-09 ENCOUNTER — Ambulatory Visit: Payer: Self-pay | Admitting: Family Medicine

## 2024-07-21 ENCOUNTER — Other Ambulatory Visit (INDEPENDENT_AMBULATORY_CARE_PROVIDER_SITE_OTHER)

## 2024-07-21 DIAGNOSIS — Z1211 Encounter for screening for malignant neoplasm of colon: Secondary | ICD-10-CM | POA: Diagnosis not present

## 2024-07-23 LAB — FECAL OCCULT BLOOD, IMMUNOCHEMICAL: Fecal Occult Bld: NEGATIVE

## 2024-09-01 ENCOUNTER — Other Ambulatory Visit: Payer: Self-pay | Admitting: Family Medicine

## 2024-09-01 DIAGNOSIS — M810 Age-related osteoporosis without current pathological fracture: Secondary | ICD-10-CM
